# Patient Record
Sex: Male | Born: 1940 | Race: White | Hispanic: No | Marital: Married | Smoking: Former smoker
Health system: Southern US, Community
[De-identification: ages and names within clinical notes are randomized; demographics above are authoritative.]

## PROBLEM LIST (undated history)

## (undated) DIAGNOSIS — F419 Anxiety disorder, unspecified: Secondary | ICD-10-CM

## (undated) DIAGNOSIS — L570 Actinic keratosis: Secondary | ICD-10-CM

## (undated) DIAGNOSIS — H269 Unspecified cataract: Secondary | ICD-10-CM

## (undated) DIAGNOSIS — D378 Neoplasm of uncertain behavior of other specified digestive organs: Secondary | ICD-10-CM

## (undated) DIAGNOSIS — N183 Chronic kidney disease, stage 3 unspecified: Secondary | ICD-10-CM

## (undated) DIAGNOSIS — E785 Hyperlipidemia, unspecified: Secondary | ICD-10-CM

## (undated) DIAGNOSIS — L409 Psoriasis, unspecified: Secondary | ICD-10-CM

## (undated) DIAGNOSIS — D371 Neoplasm of uncertain behavior of stomach: Secondary | ICD-10-CM

## (undated) DIAGNOSIS — C44311 Basal cell carcinoma of skin of nose: Secondary | ICD-10-CM

## (undated) DIAGNOSIS — D375 Neoplasm of uncertain behavior of rectum: Secondary | ICD-10-CM

## (undated) DIAGNOSIS — C61 Malignant neoplasm of prostate: Secondary | ICD-10-CM

## (undated) DIAGNOSIS — I10 Essential (primary) hypertension: Secondary | ICD-10-CM

## (undated) HISTORY — DX: Essential (primary) hypertension: I10

## (undated) HISTORY — DX: Unspecified cataract: H26.9

## (undated) HISTORY — DX: Hyperlipidemia, unspecified: E78.5

## (undated) HISTORY — PX: APPENDECTOMY: SHX54

## (undated) HISTORY — DX: Psoriasis, unspecified: L40.9

## (undated) HISTORY — DX: Chronic kidney disease, stage 3 unspecified: N18.30

## (undated) HISTORY — PX: KNEE ARTHROSCOPY: SHX127

## (undated) HISTORY — DX: Actinic keratosis: L57.0

## (undated) HISTORY — DX: Basal cell carcinoma of skin of nose: C44.311

## (undated) HISTORY — PX: PROSTATECTOMY: SHX69

## (undated) HISTORY — DX: Chronic kidney disease, stage 3 (moderate): N18.3

## (undated) HISTORY — PX: OTHER SURGICAL HISTORY: SHX169

## (undated) HISTORY — PX: COLON SURGERY: SHX602

## (undated) HISTORY — DX: Malignant neoplasm of prostate: C61

## (undated) HISTORY — PX: BOWEL RESECTION: SHX1257

## (undated) HISTORY — PX: KNEE ARTHROPLASTY: SHX992

---

## 2007-08-29 DIAGNOSIS — C61 Malignant neoplasm of prostate: Secondary | ICD-10-CM

## 2007-08-29 HISTORY — DX: Malignant neoplasm of prostate: C61

## 2015-09-14 DIAGNOSIS — I10 Essential (primary) hypertension: Secondary | ICD-10-CM | POA: Diagnosis not present

## 2015-09-14 DIAGNOSIS — E785 Hyperlipidemia, unspecified: Secondary | ICD-10-CM | POA: Diagnosis not present

## 2015-09-14 DIAGNOSIS — R7309 Other abnormal glucose: Secondary | ICD-10-CM | POA: Diagnosis not present

## 2015-12-13 DIAGNOSIS — H52223 Regular astigmatism, bilateral: Secondary | ICD-10-CM | POA: Diagnosis not present

## 2015-12-13 DIAGNOSIS — H524 Presbyopia: Secondary | ICD-10-CM | POA: Diagnosis not present

## 2015-12-13 DIAGNOSIS — H2513 Age-related nuclear cataract, bilateral: Secondary | ICD-10-CM | POA: Diagnosis not present

## 2015-12-13 DIAGNOSIS — H5203 Hypermetropia, bilateral: Secondary | ICD-10-CM | POA: Diagnosis not present

## 2016-01-18 DIAGNOSIS — E042 Nontoxic multinodular goiter: Secondary | ICD-10-CM | POA: Diagnosis not present

## 2016-01-18 DIAGNOSIS — I10 Essential (primary) hypertension: Secondary | ICD-10-CM | POA: Diagnosis not present

## 2016-03-06 DIAGNOSIS — D2371 Other benign neoplasm of skin of right lower limb, including hip: Secondary | ICD-10-CM | POA: Diagnosis not present

## 2016-03-06 DIAGNOSIS — D1801 Hemangioma of skin and subcutaneous tissue: Secondary | ICD-10-CM | POA: Diagnosis not present

## 2016-03-06 DIAGNOSIS — L02222 Furuncle of back [any part, except buttock]: Secondary | ICD-10-CM | POA: Diagnosis not present

## 2016-03-06 DIAGNOSIS — L918 Other hypertrophic disorders of the skin: Secondary | ICD-10-CM | POA: Diagnosis not present

## 2016-03-06 DIAGNOSIS — L820 Inflamed seborrheic keratosis: Secondary | ICD-10-CM | POA: Diagnosis not present

## 2016-03-06 DIAGNOSIS — L821 Other seborrheic keratosis: Secondary | ICD-10-CM | POA: Diagnosis not present

## 2016-03-06 DIAGNOSIS — L538 Other specified erythematous conditions: Secondary | ICD-10-CM | POA: Diagnosis not present

## 2016-03-06 DIAGNOSIS — L720 Epidermal cyst: Secondary | ICD-10-CM | POA: Diagnosis not present

## 2016-03-06 DIAGNOSIS — L814 Other melanin hyperpigmentation: Secondary | ICD-10-CM | POA: Diagnosis not present

## 2016-03-06 DIAGNOSIS — L298 Other pruritus: Secondary | ICD-10-CM | POA: Diagnosis not present

## 2016-03-28 DIAGNOSIS — E785 Hyperlipidemia, unspecified: Secondary | ICD-10-CM | POA: Diagnosis not present

## 2016-03-28 DIAGNOSIS — Z23 Encounter for immunization: Secondary | ICD-10-CM | POA: Diagnosis not present

## 2016-03-28 DIAGNOSIS — Z1212 Encounter for screening for malignant neoplasm of rectum: Secondary | ICD-10-CM | POA: Diagnosis not present

## 2016-03-28 DIAGNOSIS — Z0000 Encounter for general adult medical examination without abnormal findings: Secondary | ICD-10-CM | POA: Diagnosis not present

## 2016-03-28 DIAGNOSIS — I10 Essential (primary) hypertension: Secondary | ICD-10-CM | POA: Diagnosis not present

## 2016-03-28 DIAGNOSIS — R7309 Other abnormal glucose: Secondary | ICD-10-CM | POA: Diagnosis not present

## 2016-03-28 DIAGNOSIS — E041 Nontoxic single thyroid nodule: Secondary | ICD-10-CM | POA: Diagnosis not present

## 2016-03-28 DIAGNOSIS — K635 Polyp of colon: Secondary | ICD-10-CM | POA: Diagnosis not present

## 2016-03-28 DIAGNOSIS — Z125 Encounter for screening for malignant neoplasm of prostate: Secondary | ICD-10-CM | POA: Diagnosis not present

## 2016-03-28 DIAGNOSIS — I6529 Occlusion and stenosis of unspecified carotid artery: Secondary | ICD-10-CM | POA: Diagnosis not present

## 2016-09-01 ENCOUNTER — Ambulatory Visit (INDEPENDENT_AMBULATORY_CARE_PROVIDER_SITE_OTHER): Payer: Medicare Other | Admitting: Primary Care

## 2016-09-01 ENCOUNTER — Encounter: Payer: Self-pay | Admitting: Primary Care

## 2016-09-01 VITALS — BP 144/92 | HR 54 | Temp 98.0°F | Ht 73.0 in | Wt 253.1 lb

## 2016-09-01 DIAGNOSIS — E785 Hyperlipidemia, unspecified: Secondary | ICD-10-CM | POA: Diagnosis not present

## 2016-09-01 DIAGNOSIS — F411 Generalized anxiety disorder: Secondary | ICD-10-CM | POA: Diagnosis not present

## 2016-09-01 DIAGNOSIS — I10 Essential (primary) hypertension: Secondary | ICD-10-CM

## 2016-09-01 MED ORDER — SERTRALINE HCL 50 MG PO TABS: 50.0000 mg | ORAL_TABLET | Freq: Every day | ORAL | 1 refills | Status: DC

## 2016-09-01 MED ORDER — SIMVASTATIN 40 MG PO TABS: 40.0000 mg | ORAL_TABLET | Freq: Every day | ORAL | 1 refills | Status: DC

## 2016-09-01 MED ORDER — FENOFIBRATE MICRONIZED 134 MG PO CAPS: 134.0000 mg | ORAL_CAPSULE | Freq: Every day | ORAL | 1 refills | Status: DC

## 2016-09-01 MED ORDER — AMLODIPINE BESYLATE 2.5 MG PO TABS: 2.5000 mg | ORAL_TABLET | Freq: Every day | ORAL | 1 refills | Status: DC

## 2016-09-01 MED ORDER — LOSARTAN POTASSIUM-HCTZ 100-12.5 MG PO TABS: 1.0000 | ORAL_TABLET | Freq: Every day | ORAL | 1 refills | Status: DC

## 2016-09-01 NOTE — Assessment & Plan Note (Signed)
Diagnosed years ago, managed on Zoloft for about 1 year and has noted much improvement. Refill provided today, denies SI/HI.

## 2016-09-01 NOTE — Assessment & Plan Note (Signed)
Lightly above goal today, suspect due to new environment. Continue Amlodipine and Hyzaar. Will obtain records for BMP.

## 2016-09-01 NOTE — Progress Notes (Signed)
Pre visit review using our clinic review tool, if applicable. No additional management support is needed unless otherwise documented below in the visit note. 

## 2016-09-01 NOTE — Progress Notes (Signed)
   Subjective:    Patient ID: Curtis Black, male    DOB: Sep 15, 1940, 76 y.o.   MRN: [id number]  HPI  Curtis Black is a 76 year old male who presents today to establish care and discuss the problems mentioned below. Will obtain old records. His last physical was in June 2017. He is due for his colonoscopy in 2018.   1) Essential Hypertension: Diagnosed 10 years. Currently managed on Amlodipine 2.5 mg and Losartan-HCTZ 100-12.5 mg. He denies chest pain, dizziness, visual changes in his vision. He is needing refills of his medication.   2) Hyperlipidemia: Currently managed on Simvastatin 40 mg, Fish Oil, and fenofibrate 134 mg. He also takes a daily aspirin. His last lipid panel was in August 2017. He denies myalgias.   3) Generalized Anxiety Disorder: Currently managed on Zoloft 50 mg for which he's taken for the past 1 year. He feels well managed on this medication. He denies SI/HI.  Review of Systems  Eyes: Negative for visual disturbance.  Respiratory: Negative for shortness of breath.   Cardiovascular: Negative for chest pain.  Musculoskeletal: Negative for myalgias.  Neurological: Negative for dizziness and headaches.  Psychiatric/Behavioral: Negative for suicidal ideas.       Past Medical History:  Diagnosis Date  . Essential hypertension   . Hyperlipidemia   . Prostate cancer [facility]) 2009     Social History   Social History  . Marital status: Married    Spouse name: N/A  . Number of children: N/A  . Years of education: N/A   Occupational History  . Not on file.   Social History Main Topics  . Smoking status: Former Research scientist (life sciences)  . Smokeless tobacco: Not on file  . Alcohol use Yes  . Drug use: Unknown  . Sexual activity: Not on file   Other Topics Concern  . Not on file   Social History Narrative   Married.   No children.   Retired. Once worked for [employer].   Enjoys working on his house, playing golf, riding his bike.     No past surgical history on file.  No  family history on file.  No Known Allergies  No current outpatient prescriptions on file prior to visit.   No current facility-administered medications on file prior to visit.     BP (!) 144/92   Pulse (!) 54   Temp 98 F (36.7 C) (Oral)   Ht 6\' 1"  (1.854 m)   Wt 253 lb 1.9 oz (114.8 kg)   SpO2 (!) 54%   BMI 33.40 kg/m    Objective:   Physical Exam  Constitutional: He is oriented to person, place, and time. He appears well-nourished.  Neck: Neck supple.  Cardiovascular: Normal rate and regular rhythm.   Pulmonary/Chest: Effort normal and breath sounds normal. He has no wheezes. He has no rales.  Neurological: He is alert and oriented to person, place, and time.  Skin: Skin is warm and dry.  Psychiatric: He has a normal mood and affect.          Assessment & Plan:

## 2016-09-01 NOTE — Assessment & Plan Note (Signed)
More so hypertriglyceridemia. Continue simvastatin, fish oil, and fenofibrate. Refills provided today. Will obtain records for last labs.

## 2016-09-01 NOTE — Patient Instructions (Signed)
I sent refills of your medications to the requested pharmacies.  Please schedule a physical with me in June 2018. You may also schedule a lab only appointment 3-4 days prior. We will discuss your lab results in detail during your physical.  It was a pleasure to meet you today! Please don't hesitate to call me with any questions. Welcome to [facility]!

## 2016-09-08 ENCOUNTER — Encounter: Payer: Self-pay | Admitting: Primary Care

## 2016-09-08 ENCOUNTER — Telehealth: Payer: Self-pay | Admitting: Primary Care

## 2016-09-08 NOTE — Telephone Encounter (Signed)
Please notify patient that I received and reviewed his records which indicate that he has an aortic abdominal aneurysm. I'd like to get an updated ultrasound to see if there's any change. Please notify me if he's agreeable and I'll order.

## 2016-09-11 NOTE — Telephone Encounter (Signed)
Spoken to patient and he stated that this was a mistake. It ws an error at that office. Both patient and his wife were both Dx with aortic abdominal aneurysm about a week a part. So both went for a follow up and it confirm that it was a mistake.

## 2016-09-11 NOTE — Telephone Encounter (Signed)
Noted  

## 2016-10-31 DIAGNOSIS — H2513 Age-related nuclear cataract, bilateral: Secondary | ICD-10-CM | POA: Diagnosis not present

## 2017-01-17 DIAGNOSIS — D1800 Hemangioma unspecified site: Secondary | ICD-10-CM | POA: Diagnosis not present

## 2017-01-17 DIAGNOSIS — L700 Acne vulgaris: Secondary | ICD-10-CM | POA: Diagnosis not present

## 2017-01-17 DIAGNOSIS — L570 Actinic keratosis: Secondary | ICD-10-CM | POA: Diagnosis not present

## 2017-01-17 DIAGNOSIS — C44311 Basal cell carcinoma of skin of nose: Secondary | ICD-10-CM | POA: Diagnosis not present

## 2017-01-17 DIAGNOSIS — D229 Melanocytic nevi, unspecified: Secondary | ICD-10-CM | POA: Diagnosis not present

## 2017-01-17 DIAGNOSIS — L918 Other hypertrophic disorders of the skin: Secondary | ICD-10-CM | POA: Diagnosis not present

## 2017-01-17 DIAGNOSIS — D485 Neoplasm of uncertain behavior of skin: Secondary | ICD-10-CM | POA: Diagnosis not present

## 2017-01-17 DIAGNOSIS — Z1283 Encounter for screening for malignant neoplasm of skin: Secondary | ICD-10-CM | POA: Diagnosis not present

## 2017-01-17 DIAGNOSIS — L821 Other seborrheic keratosis: Secondary | ICD-10-CM | POA: Diagnosis not present

## 2017-01-17 DIAGNOSIS — Z85828 Personal history of other malignant neoplasm of skin: Secondary | ICD-10-CM | POA: Diagnosis not present

## 2017-01-17 DIAGNOSIS — D692 Other nonthrombocytopenic purpura: Secondary | ICD-10-CM | POA: Diagnosis not present

## 2017-01-17 DIAGNOSIS — L720 Epidermal cyst: Secondary | ICD-10-CM | POA: Diagnosis not present

## 2017-01-17 DIAGNOSIS — L578 Other skin changes due to chronic exposure to nonionizing radiation: Secondary | ICD-10-CM | POA: Diagnosis not present

## 2017-01-30 DIAGNOSIS — L720 Epidermal cyst: Secondary | ICD-10-CM | POA: Diagnosis not present

## 2017-02-15 ENCOUNTER — Encounter: Payer: Medicare Other | Admitting: Primary Care

## 2017-03-06 DIAGNOSIS — C4491 Basal cell carcinoma of skin, unspecified: Secondary | ICD-10-CM

## 2017-03-06 DIAGNOSIS — C44319 Basal cell carcinoma of skin of other parts of face: Secondary | ICD-10-CM | POA: Diagnosis not present

## 2017-03-06 DIAGNOSIS — D2339 Other benign neoplasm of skin of other parts of face: Secondary | ICD-10-CM | POA: Diagnosis not present

## 2017-03-06 DIAGNOSIS — C44119 Basal cell carcinoma of skin of left eyelid, including canthus: Secondary | ICD-10-CM | POA: Diagnosis not present

## 2017-03-06 HISTORY — DX: Basal cell carcinoma of skin, unspecified: C44.91

## 2017-03-11 ENCOUNTER — Other Ambulatory Visit: Payer: Self-pay | Admitting: Primary Care

## 2017-03-11 DIAGNOSIS — I10 Essential (primary) hypertension: Secondary | ICD-10-CM

## 2017-03-12 DIAGNOSIS — C44119 Basal cell carcinoma of skin of left eyelid, including canthus: Secondary | ICD-10-CM | POA: Diagnosis not present

## 2017-03-13 ENCOUNTER — Other Ambulatory Visit: Payer: Self-pay | Admitting: Primary Care

## 2017-03-13 DIAGNOSIS — F411 Generalized anxiety disorder: Secondary | ICD-10-CM

## 2017-03-13 DIAGNOSIS — I10 Essential (primary) hypertension: Secondary | ICD-10-CM

## 2017-03-21 ENCOUNTER — Other Ambulatory Visit: Payer: Self-pay | Admitting: Primary Care

## 2017-03-21 DIAGNOSIS — E785 Hyperlipidemia, unspecified: Secondary | ICD-10-CM

## 2017-03-21 DIAGNOSIS — I10 Essential (primary) hypertension: Secondary | ICD-10-CM

## 2017-03-21 DIAGNOSIS — Z8546 Personal history of malignant neoplasm of prostate: Secondary | ICD-10-CM

## 2017-03-23 NOTE — Progress Notes (Signed)
Subjective:   Curtis Black is a 76 y.o. male who presents for an Initial Medicare Annual Wellness Visit.  Review of Systems  No ROS.  Medicare Wellness Visit. Additional risk factors are reflected in the social history.  Cardiac Risk Factors include: advanced age (>13men, >94 women);male gender;dyslipidemia;hypertension;sedentary lifestyle;obesity (BMI >30kg/m2)    Objective:    Today's Vitals   03/30/17 0803  BP: 140/82  Pulse: (!) 56  Resp: 16  SpO2: 96%  Weight: 244 lb 12.8 oz (111 kg)  Height: 6' 1.2" (1.859 m)   Body mass index is 32.12 kg/m.  Current Medications (verified) Outpatient Encounter Prescriptions as of 03/30/2017  Medication Sig  . amLODipine (NORVASC) 2.5 MG tablet TAKE 1 TABLET (2.5 MG TOTAL) BY MOUTH DAILY.  Marland Kitchen aspirin EC 81 MG tablet Take 81 mg by mouth daily.  . Coenzyme Q10 (COQ10 PO) Take 100 capsules by mouth daily.  . fenofibrate micronized (LOFIBRA) 134 MG capsule Take 1 capsule (134 mg total) by mouth daily before breakfast.  . losartan-hydrochlorothiazide (HYZAAR) 100-12.5 MG tablet TAKE 1 TABLET DAILY  . Multiple Vitamin (MULTIVITAMIN) tablet Take 1 tablet by mouth daily.  . Omega-3 Fatty Acids (FISH OIL) 1000 MG CAPS Take 1,000 mg by mouth 2 (two) times daily.  . sertraline (ZOLOFT) 50 MG tablet TAKE 1 TABLET (50 MG TOTAL) BY MOUTH DAILY.  . simvastatin (ZOCOR) 40 MG tablet Take 1 tablet (40 mg total) by mouth at bedtime.   No facility-administered encounter medications on file as of 03/30/2017.     Allergies (verified) Patient has no known allergies.   History: Past Medical History:  Diagnosis Date  . Abdominal aneurysm ([place])   . Basal cell carcinoma (BCC) of nostril    Removed, no other intervention needed per pt.  . Essential hypertension   . Hyperlipidemia   . Prostate cancer ([place]) 2009   Past Surgical History:  Procedure Laterality Date  . APPENDECTOMY    . COLON SURGERY    . KNEE ARTHROPLASTY Right   . PROSTATECTOMY    .  thyroid biop     Family History  Problem Relation Age of Onset  . Diabetes Sister    Social History   Occupational History  . Not on file.   Social History Main Topics  . Smoking status: Former Research scientist (life sciences)  . Smokeless tobacco: Never Used  . Alcohol use Yes     Comment: Occ.   . Drug use: Unknown  . Sexual activity: Not on file   Tobacco Counseling Counseling given: Not Answered   Activities of Daily Living In your present state of health, do you have any difficulty performing the following activities: 03/30/2017  Hearing? N  Vision? N  Difficulty concentrating or making decisions? N  Walking or climbing stairs? N  Dressing or bathing? N  Doing errands, shopping? N  Preparing Food and eating ? N  Using the Toilet? N  In the past six months, have you accidently leaked urine? N  Do you have problems with loss of bowel control? N  Managing your Medications? N  Managing your Finances? N  Housekeeping or managing your Housekeeping? N  Some recent data might be hidden    Immunizations and Health Maintenance Immunization History  Administered Date(s) Administered  . Pneumococcal Polysaccharide-23 01/05/2010   Health Maintenance Due  Topic Date Due  . TETANUS/TDAP  02/16/1962  . COLONOSCOPY  02/16/1993  . PNA vac Low Risk Adult (2 of 2 - PCV13) 01/06/2011  . INFLUENZA VACCINE  03/28/2017    Patient Care Team: Pleas Koch, NP as PCP - General (Internal Medicine)  Indicate any recent Medical Services you may have received from other than [facility] providers in the past year (date may be approximate).    Assessment:   This is a routine wellness examination for Curtis Black. Physical assessment deferred to PCP.   Hearing/Vision screen  Hearing Screening   125Hz  250Hz  500Hz  1000Hz  2000Hz  3000Hz  4000Hz  6000Hz  8000Hz   Right ear:   40 40 40  0    Left ear:   40 40 0  0    Vision Screening Comments: 02/2017, [facility].  Dietary issues and exercise activities  discussed: Current Exercise Habits: Home exercise routine, Type of exercise: walking (Golf), Time (Minutes): > 60, Frequency (Times/Week): 2, Weekly Exercise (Minutes/Week): 0, Exercise limited by: None identified  Goals    . Exercise 6x per week (30 min per time)          Starting 03/30/2017 I will start working out 6x per week. I also want to loose 40 lbs (current weight 244.8 lbs).       Depression Screen PHQ 2/9 Scores 03/30/2017  PHQ - 2 Score 0    Fall Risk Fall Risk  03/30/2017  Falls in the past year? No    Cognitive Function: PLEASE NOTE: A Mini-Cog screen was completed. Maximum score is 20. A value of 0 denotes this part of Folstein MMSE was not completed or the patient failed this part of the Mini-Cog screening.   Mini-Cog Screening Orientation to Time - Max 5 pts Orientation to Place - Max 5 pts Registration - Max 3 pts Recall - Max 3 pts Language Repeat - Max 1 pts Language Follow 3 Step Command - Max 3 pts      Mini-Cog - 03/30/17 0811    Normal clock drawing test? yes   How many words correct? 3      MMSE - Mini Mental State Exam 03/30/2017  Orientation to time 5  Orientation to Place 5  Registration 3  Attention/ Calculation 0  Recall 3  Language- name 2 objects 0  Language- repeat 1  Language- follow 3 step command 3  Language- read & follow direction 0  Write a sentence 0  Copy design 0  Total score 20        Screening Tests Health Maintenance  Topic Date Due  . TETANUS/TDAP  02/16/1962  . COLONOSCOPY  02/16/1993  . PNA vac Low Risk Adult (2 of 2 - PCV13) 01/06/2011  . INFLUENZA VACCINE  03/28/2017        Plan:   Follow up with PCP as directed.  I have personally reviewed and noted the following in the patient's chart:   . Medical and social history . Use of alcohol, tobacco or illicit drugs  . Current medications and supplements . Functional ability and status . Nutritional status . Physical activity . Advanced directives . List  of other physicians . Vitals . Screenings to include cognitive, depression, and falls . Referrals and appointments  In addition, I have reviewed and discussed with patient certain preventive protocols, quality metrics, and best practice recommendations. A written personalized care plan for preventive services as well as general preventive health recommendations were provided to patient.     Ree Edman, RN   03/30/2017

## 2017-03-23 NOTE — Progress Notes (Signed)
PCP notes:   Health maintenance: Tdap - unsure of last one.  Colonoscopy - 3 years ago per pt. Pt believes he is due. He will check his records before his next appt. He will need a GI referral.   Abnormal screenings: None.   Patient concerns: BP has been trending high. Pt will bring his BP log into his next appt. Pt also states he had an episode of vertigo 4 months ago. Denies allergies. States it only lasted 10 minutes and was gone, no episodes of vertigo since. Suggested he schedule an appt the next time this happens.   Nurse concerns: None.    Next PCP appt: 04/06/2017.

## 2017-03-30 ENCOUNTER — Other Ambulatory Visit (INDEPENDENT_AMBULATORY_CARE_PROVIDER_SITE_OTHER): Payer: Medicare Other

## 2017-03-30 ENCOUNTER — Encounter (INDEPENDENT_AMBULATORY_CARE_PROVIDER_SITE_OTHER): Payer: Self-pay

## 2017-03-30 ENCOUNTER — Ambulatory Visit (INDEPENDENT_AMBULATORY_CARE_PROVIDER_SITE_OTHER): Payer: Medicare Other

## 2017-03-30 VITALS — BP 140/82 | HR 56 | Resp 16 | Ht 73.2 in | Wt 244.8 lb

## 2017-03-30 DIAGNOSIS — E785 Hyperlipidemia, unspecified: Secondary | ICD-10-CM

## 2017-03-30 DIAGNOSIS — Z8546 Personal history of malignant neoplasm of prostate: Secondary | ICD-10-CM | POA: Diagnosis not present

## 2017-03-30 DIAGNOSIS — Z0000 Encounter for general adult medical examination without abnormal findings: Secondary | ICD-10-CM

## 2017-03-30 DIAGNOSIS — I10 Essential (primary) hypertension: Secondary | ICD-10-CM

## 2017-03-30 LAB — COMPREHENSIVE METABOLIC PANEL
ALT: 31 U/L (ref 0–53)
AST: 30 U/L (ref 0–37)
Albumin: 4.7 g/dL (ref 3.5–5.2)
Alkaline Phosphatase: 38 U/L — ABNORMAL LOW (ref 39–117)
BUN: 20 mg/dL (ref 6–23)
CALCIUM: 10 mg/dL (ref 8.4–10.5)
CO2: 30 meq/L (ref 19–32)
Chloride: 104 mEq/L (ref 96–112)
Creatinine, Ser: 1.49 mg/dL (ref 0.40–1.50)
GFR: 48.99 mL/min — AB (ref >60.00)
GLUCOSE: 109 mg/dL — AB (ref 70–99)
POTASSIUM: 4.3 meq/L (ref 3.5–5.1)
Sodium: 141 mEq/L (ref 135–145)
Total Bilirubin: 0.7 mg/dL (ref 0.2–1.2)
Total Protein: 7.2 g/dL (ref 6.0–8.3)

## 2017-03-30 LAB — LIPID PANEL
CHOL/HDL RATIO: 5
Cholesterol: 173 mg/dL (ref 0–200)
HDL: 36.5 mg/dL — AB (ref >39.00)
LDL Cholesterol: 102 mg/dL — ABNORMAL HIGH (ref 0–99)
NONHDL: 136.16
TRIGLYCERIDES: 171 mg/dL — AB (ref 0.0–149.0)
VLDL: 34.2 mg/dL (ref 0.0–40.0)

## 2017-03-30 LAB — PSA: PSA: 0 ng/mL — ABNORMAL LOW (ref 0.10–4.00)

## 2017-03-30 NOTE — Progress Notes (Signed)
I reviewed health advisor's note, was available for consultation, and agree with documentation and plan.  

## 2017-03-30 NOTE — Patient Instructions (Addendum)
[name] ,  Bring a copy of your advance directives to your next office visit.  Thank you for taking time to come for your Medicare Wellness Visit. I appreciate your ongoing commitment to your health goals. Please review the following plan we discussed and let me know if I can assist you in the future.   These are the goals we discussed: Goals    . Exercise 6x per week (30 min per time)          Starting 03/30/2017 I will start working out 6x per week. I also want to loose 40 lbs (current weight 244.8 lbs).        This is a list of the screening recommended for you and due dates:  Health Maintenance  Topic Date Due  . Tetanus Vaccine  02/16/1962  . Colon Cancer Screening  02/16/1993  . Pneumonia vaccines (2 of 2 - PCV13) 01/06/2011  . Flu Shot  03/28/2017   Preventive Care for Adults  A healthy lifestyle and preventive care can promote health and wellness. Preventive health guidelines for adults include the following key practices.  . A routine yearly physical is a good way to check with your health care provider about your health and preventive screening. It is a chance to share any concerns and updates on your health and to receive a thorough exam.  . Visit your dentist for a routine exam and preventive care every 6 months. Brush your teeth twice a day and floss once a day. Good oral hygiene prevents tooth decay and gum disease.  . The frequency of eye exams is based on your age, health, family medical history, use  of contact lenses, and other factors. Follow your health care provider's ecommendations for frequency of eye exams.  . Eat a healthy diet. Foods like vegetables, fruits, whole grains, low-fat dairy products, and lean protein foods contain the nutrients you need without too many calories. Decrease your intake of foods high in solid fats, added sugars, and salt. Eat the right amount of calories for you. Get information about a proper diet from your health care provider,  if necessary.  . Regular physical exercise is one of the most important things you can do for your health. Most adults should get at least 150 minutes of moderate-intensity exercise (any activity that increases your heart rate and causes you to sweat) each week. In addition, most adults need muscle-strengthening exercises on 2 or more days a week.  Silver Sneakers may be a benefit available to you. To determine eligibility, you may visit the website: www.silversneakers.com or contact program at 949-519-9984 Mon-Fri between 8AM-8PM.   . Maintain a healthy weight. The body mass index (BMI) is a screening tool to identify possible weight problems. It provides an estimate of body fat based on height and weight. Your health care provider can find your BMI and can help you achieve or maintain a healthy weight.   For adults 20 years and older: ? A BMI below 18.5 is considered underweight. ? A BMI of 18.5 to 24.9 is normal. ? A BMI of 25 to 29.9 is considered overweight. ? A BMI of 30 and above is considered obese.   . Maintain normal blood lipids and cholesterol levels by exercising and minimizing your intake of saturated fat. Eat a balanced diet with plenty of fruit and vegetables. Blood tests for lipids and cholesterol should begin at age 17 and be repeated every 5 years. If your lipid or cholesterol levels are  high, you are over 50, or you are at high risk for heart disease, you may need your cholesterol levels checked more frequently. Ongoing high lipid and cholesterol levels should be treated with medicines if diet and exercise are not working.  . If you smoke, find out from your health care provider how to quit. If you do not use tobacco, please do not start.  . If you choose to drink alcohol, please do not consume more than 2 drinks per day. One drink is considered to be 12 ounces (355 mL) of beer, 5 ounces (148 mL) of wine, or 1.5 ounces (44 mL) of liquor.  . If you are 90-64 years old, ask  your health care provider if you should take aspirin to prevent strokes.  . Use sunscreen. Apply sunscreen liberally and repeatedly throughout the day. You should seek shade when your shadow is shorter than you. Protect yourself by wearing long sleeves, pants, a wide-brimmed hat, and sunglasses year round, whenever you are outdoors.  . Once a month, do a whole body skin exam, using a mirror to look at the skin on your back. Tell your health care provider of new moles, moles that have irregular borders, moles that are larger than a pencil eraser, or moles that have changed in shape or color.

## 2017-04-06 ENCOUNTER — Ambulatory Visit (INDEPENDENT_AMBULATORY_CARE_PROVIDER_SITE_OTHER): Payer: Medicare Other | Admitting: Primary Care

## 2017-04-06 ENCOUNTER — Encounter: Payer: Self-pay | Admitting: Primary Care

## 2017-04-06 ENCOUNTER — Other Ambulatory Visit: Payer: Self-pay | Admitting: Primary Care

## 2017-04-06 ENCOUNTER — Telehealth: Payer: Self-pay

## 2017-04-06 VITALS — BP 136/84 | HR 61 | Temp 97.5°F | Ht 73.0 in | Wt 247.8 lb

## 2017-04-06 DIAGNOSIS — I10 Essential (primary) hypertension: Secondary | ICD-10-CM

## 2017-04-06 DIAGNOSIS — R7303 Prediabetes: Secondary | ICD-10-CM | POA: Insufficient documentation

## 2017-04-06 DIAGNOSIS — F411 Generalized anxiety disorder: Secondary | ICD-10-CM | POA: Diagnosis not present

## 2017-04-06 DIAGNOSIS — Z23 Encounter for immunization: Secondary | ICD-10-CM

## 2017-04-06 DIAGNOSIS — R739 Hyperglycemia, unspecified: Secondary | ICD-10-CM

## 2017-04-06 DIAGNOSIS — Z1211 Encounter for screening for malignant neoplasm of colon: Secondary | ICD-10-CM

## 2017-04-06 DIAGNOSIS — E785 Hyperlipidemia, unspecified: Secondary | ICD-10-CM

## 2017-04-06 LAB — HEMOGLOBIN A1C: HEMOGLOBIN A1C: 5.9 % (ref 4.6–6.5)

## 2017-04-06 MED ORDER — SERTRALINE HCL 50 MG PO TABS: 50.0000 mg | ORAL_TABLET | Freq: Every day | ORAL | 3 refills | Status: DC

## 2017-04-06 MED ORDER — FENOFIBRATE MICRONIZED 134 MG PO CAPS: 134.0000 mg | ORAL_CAPSULE | Freq: Every day | ORAL | 3 refills | Status: DC

## 2017-04-06 MED ORDER — ATORVASTATIN CALCIUM 20 MG PO TABS: 20.0000 mg | ORAL_TABLET | Freq: Every day | ORAL | 3 refills | Status: DC

## 2017-04-06 MED ORDER — AMLODIPINE BESYLATE 5 MG PO TABS: ORAL_TABLET | ORAL | 3 refills | Status: DC

## 2017-04-06 MED ORDER — ZOSTER VAC RECOMB ADJUVANTED 50 MCG/0.5ML IM SUSR: INTRAMUSCULAR | 1 refills | Status: DC

## 2017-04-06 MED ORDER — SIMVASTATIN 40 MG PO TABS: 40.0000 mg | ORAL_TABLET | Freq: Every day | ORAL | 3 refills | Status: DC

## 2017-04-06 MED ORDER — LOSARTAN POTASSIUM-HCTZ 100-12.5 MG PO TABS: 1.0000 | ORAL_TABLET | Freq: Every day | ORAL | 3 refills | Status: DC

## 2017-04-06 NOTE — Telephone Encounter (Signed)
Message left for patient to return my call.  

## 2017-04-06 NOTE — Progress Notes (Signed)
Subjective:    Patient ID: Curtis Black, male    DOB: [birth year], 76 y.o.   MRN: [id number]  HPI  Curtis Black is a 76 year old male who presents today for [place] Part 2. He's never had the Prevnar 13 vaccination. History of two Pneumovax vaccinations. Did complete Zostavax years ago, would like the Shingrix. Due for colonoscopy.   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 Endorses that this was a mis-diagnosis. Last ultrasound was negative in 2017.  2) Essential Hypertension: Currently managed on amlodipine 2.5 mg, losartan-HCTZ 100-12.5 mg. He believes his BP is trending upward. Checking BP at home and is getting readings of 120-150's/70's-90's. Mostly 140's/90's.   BP Readings from Last 3 Encounters:  04/06/17 136/84  03/30/17 140/82  09/01/16 (!) 144/92     3) GAD: Currently managed on sertraline 50 mg. Feels well managed. Denies SI/HI.   4) Hyperlipidemia: Currently managed on simvastatin 40 mg and fenofibrate 134 mg. Recent lipid panel with trigs slightly above goal. TC and LDL stable.   Diet: He endorses a poor diet. Breakfast: Skips Lunch: Salad, bread, potatoes, pasta, meat Dinner: Cheese, salad Snacks: Cheese, snacks Desserts: Occasionally  Beverages: Coffee, water, wine (daily)  Exercise: Active, not exercising much. Eye exam: Completed in March 2018. [facility]. Colonoscopy: Completed three years ago, due again. Needing referral. PSA: Negative. History of prostatectomy.    Review of Systems  Constitutional: Negative for unexpected weight change.  HENT: Negative for rhinorrhea.   Respiratory: Negative for cough and shortness of breath.   Cardiovascular: Negative for chest pain.  Gastrointestinal: Negative for constipation and diarrhea.  Genitourinary: Negative for difficulty urinating.  Musculoskeletal: Negative for arthralgias and myalgias.  Skin: Negative for rash.  Allergic/Immunologic: Negative for environmental allergies.    Neurological: Negative for dizziness, numbness and headaches.  Psychiatric/Behavioral:       Denies concerns for anxiety or depression. Doing well on Zoloft.       Past Medical History:  Diagnosis Date  . Basal cell carcinoma (BCC) of nostril    Removed, no other intervention needed per pt.  . Essential hypertension   . Hyperlipidemia   . Prostate cancer [facility]) 2009     Social History   Social History  . Marital status: Married    Spouse name: N/A  . Number of children: N/A  . Years of education: N/A   Occupational History  . Not on file.   Social History Main Topics  . Smoking status: Former Research scientist (life sciences)  . Smokeless tobacco: Never Used  . Alcohol use Yes     Comment: Occ.   . Drug use: Unknown  . Sexual activity: Not on file   Other Topics Concern  . Not on file   Social History Narrative   Married.   No children.   Retired. Once worked for [employer].   Enjoys working on his house, playing golf, riding his bike.     Past Surgical History:  Procedure Laterality Date  . APPENDECTOMY    . BOWEL RESECTION    . COLON SURGERY    . KNEE ARTHROPLASTY Right   . PROSTATECTOMY    . thyroid biop      Family History  Problem Relation Age of Onset  . Diabetes Sister     No Known Allergies  Current Outpatient Prescriptions on File Prior to Visit  Medication Sig Dispense Refill  . aspirin EC 81 MG tablet Take 81 mg by mouth daily.    Marland Kitchen  Coenzyme Q10 (COQ10 PO) Take 100 capsules by mouth daily.    . Multiple Vitamin (MULTIVITAMIN) tablet Take 1 tablet by mouth daily.    . Omega-3 Fatty Acids (FISH OIL) 1000 MG CAPS Take 1,000 mg by mouth 2 (two) times daily.     No current facility-administered medications on file prior to visit.     BP 136/84   Pulse 61   Temp (!) 97.5 F (36.4 C) (Oral)   Ht 6\' 1"  (1.854 m)   Wt 247 lb 12.8 oz (112.4 kg)   SpO2 95%   BMI 32.69 kg/m    Objective:   Physical Exam  Constitutional: He is oriented to person, place, and time.  He appears well-nourished.  HENT:  Right Ear: Tympanic membrane and ear canal normal.  Left Ear: Tympanic membrane and ear canal normal.  Nose: Nose normal. Right sinus exhibits no maxillary sinus tenderness and no frontal sinus tenderness. Left sinus exhibits no maxillary sinus tenderness and no frontal sinus tenderness.  Mouth/Throat: Oropharynx is clear and moist.  Eyes: Pupils are equal, round, and reactive to light. Conjunctivae and EOM are normal.  Neck: Neck supple. Carotid bruit is not present. No thyromegaly present.  Cardiovascular: Normal rate, regular rhythm and normal heart sounds.   Pulmonary/Chest: Effort normal and breath sounds normal. He has no wheezes. He has no rales.  Abdominal: Soft. Bowel sounds are normal. There is no tenderness.  Musculoskeletal: Normal range of motion.  Neurological: He is alert and oriented to person, place, and time. He has normal reflexes. No cranial nerve deficit.  Skin: Skin is warm and dry.  Psychiatric: He has a normal mood and affect.          Assessment & Plan:  Health Maintenance:  Provided Prevnar 13 today. Referral placed to GI for colonoscopy. Rx for Shingrix provided. Discussed the importance of a healthy diet and regular exercise in order for weight loss, and to reduce the risk of other medical problems.  Sheral Flow, NP

## 2017-04-06 NOTE — Addendum Note (Signed)
Addended by: Jacqualin Combes on: 04/06/2017 09:34 AM   Modules accepted: Orders

## 2017-04-06 NOTE — Telephone Encounter (Signed)
Spoken to [facility] and notified her of Kate's comments below for patient.

## 2017-04-06 NOTE — Telephone Encounter (Signed)
Spoken and notified patient of Kate's comments. Patient verbalized understanding. 

## 2017-04-06 NOTE — Patient Instructions (Signed)
Complete lab work prior to leaving today. I will notify you of your results once received.   You will be contacted regarding your referral to GI for the colonoscopy.  Please let us know if you have not heard back within one week.   Take the Shingrix vaccination to the pharmacy for administration. This is a two dose series.  Start exercising. You should be getting 150 minutes of moderate intensity exercise weekly.  It's important to improve your diet by reducing consumption of fast food, fried food, processed snack foods, sugary drinks. Increase consumption of fresh vegetables and fruits, whole grains, water.  Ensure you are drinking 64 ounces of water daily.  We've increased your Amlodipine to 5 mg. You may take two of the 2.5 mg tablets until your current bottle is empty. I sent the 5 mg tablets to your pharmacy.  Follow up in 1 year for your annual exam or sooner if needed.  It was a pleasure to see you today!

## 2017-04-06 NOTE — Assessment & Plan Note (Signed)
Home BP numbers above goal. Will increase Amlodipine to 5 mg. He will monitor readings and report readings at or above 140/90.

## 2017-04-06 NOTE — Telephone Encounter (Signed)
Dollie at [place] left v/m about possible drug interaction between amlodipine and simvastatin. Dollie request cb.

## 2017-04-06 NOTE — Telephone Encounter (Signed)
Please notify Dollie that he's been taking Amlodipine for over 1 year, but will switch to atorvastatin 20 mg. Please discontinue simvastatin 40 mg through [facility] Mail order. Start atorvastatin 20 mg tablets. Take 1 tablet by mouth every evening. I sent this through mail order. Continue with increased Amlodipine dose. Please notify patient of all these changes.

## 2017-04-06 NOTE — Assessment & Plan Note (Signed)
Doing well on Zoloft, continue same. Refills sent to pharmacy. 

## 2017-04-06 NOTE — Assessment & Plan Note (Signed)
Slightly above goal with Trigs, however, much improved overall. Continue aspirin, fenofibrate, simvastatin. Continue to monitor lipids. Recommended regular exercise.

## 2017-04-06 NOTE — Assessment & Plan Note (Signed)
Noted on recent labs. Check A1C today.

## 2017-04-17 ENCOUNTER — Telehealth: Payer: Self-pay

## 2017-04-17 ENCOUNTER — Other Ambulatory Visit: Payer: Self-pay

## 2017-04-17 DIAGNOSIS — Z86010 Personal history of colonic polyps: Secondary | ICD-10-CM

## 2017-04-17 NOTE — Telephone Encounter (Signed)
Gastroenterology Pre-Procedure Review  Request Date: 10/9 Requesting Physician: Dr. Vicente Males  *No major illnesses at this time.  PATIENT REVIEW QUESTIONS: The patient responded to the following health history questions as indicated:    1. Are you having any GI issues? no 2. Do you have a personal history of Polyps? yes (removed 2015 & cecum removal) 3. Do you have a family history of Colon Cancer or Polyps? no 4. Diabetes Mellitus? no 5. Joint replacements in the past 12 months?no 6. Major health problems in the past 3 months?no 7. Any artificial heart valves, MVP, or defibrillator?no    MEDICATIONS & ALLERGIES:    Patient reports the following regarding taking any anticoagulation/antiplatelet therapy:   Plavix, Coumadin, Eliquis, Xarelto, Lovenox, Pradaxa, Brilinta, or Effient? no Aspirin? yes (81mg )  Patient confirms/reports the following medications:  Current Outpatient Prescriptions  Medication Sig Dispense Refill  . amLODipine (NORVASC) 5 MG tablet Take 1 tablet by mouth once daily for blood pressure 90 tablet 3  . aspirin EC 81 MG tablet Take 81 mg by mouth daily.    Marland Kitchen atorvastatin (LIPITOR) 20 MG tablet Take 1 tablet (20 mg total) by mouth daily. 90 tablet 3  . Coenzyme Q10 (COQ10 PO) Take 100 capsules by mouth daily.    . fenofibrate micronized (LOFIBRA) 134 MG capsule Take 1 capsule (134 mg total) by mouth daily before breakfast. 90 capsule 3  . losartan-hydrochlorothiazide (HYZAAR) 100-12.5 MG tablet Take 1 tablet by mouth daily. 90 tablet 3  . Multiple Vitamin (MULTIVITAMIN) tablet Take 1 tablet by mouth daily.    . Omega-3 Fatty Acids (FISH OIL) 1000 MG CAPS Take 1,000 mg by mouth 2 (two) times daily.    . sertraline (ZOLOFT) 50 MG tablet Take 1 tablet (50 mg total) by mouth daily. 90 tablet 3  . Zoster Vac Recomb Adjuvanted [facility]) injection Inject into the muscle once. Repeat 2-6 months after the first vaccination. 0.5 mL 1   No current facility-administered  medications for this visit.     Patient confirms/reports the following allergies:  No Known Allergies  No orders of the defined types were placed in this encounter.   AUTHORIZATION INFORMATION Primary Insurance: 1D#: Group #:  Secondary Insurance: 1D#: Group #:  SCHEDULE INFORMATION: Date: 10/9 Time: Location: [place]

## 2017-06-05 ENCOUNTER — Encounter
Admission: RE | Disposition: A | Discharge status: Home / Self Care | Payer: Self-pay | Source: Ambulatory Visit | Attending: Gastroenterology

## 2017-06-05 ENCOUNTER — Ambulatory Visit: Payer: Medicare Other | Admitting: Anesthesiology

## 2017-06-05 ENCOUNTER — Encounter: Payer: Self-pay | Admitting: *Deleted

## 2017-06-05 ENCOUNTER — Ambulatory Visit
Admission: RE | Admit: 2017-06-05 | Discharge: 2017-06-05 | Disposition: A | Discharge status: Home / Self Care | Payer: Medicare Other | Source: Ambulatory Visit | Attending: Gastroenterology | Admitting: Gastroenterology

## 2017-06-05 DIAGNOSIS — Z85828 Personal history of other malignant neoplasm of skin: Secondary | ICD-10-CM | POA: Insufficient documentation

## 2017-06-05 DIAGNOSIS — D124 Benign neoplasm of descending colon: Secondary | ICD-10-CM | POA: Diagnosis not present

## 2017-06-05 DIAGNOSIS — Z86010 Personal history of colonic polyps: Secondary | ICD-10-CM | POA: Diagnosis not present

## 2017-06-05 DIAGNOSIS — D125 Benign neoplasm of sigmoid colon: Secondary | ICD-10-CM | POA: Insufficient documentation

## 2017-06-05 DIAGNOSIS — Z8546 Personal history of malignant neoplasm of prostate: Secondary | ICD-10-CM | POA: Diagnosis not present

## 2017-06-05 DIAGNOSIS — Z87891 Personal history of nicotine dependence: Secondary | ICD-10-CM | POA: Diagnosis not present

## 2017-06-05 DIAGNOSIS — Z7982 Long term (current) use of aspirin: Secondary | ICD-10-CM | POA: Insufficient documentation

## 2017-06-05 DIAGNOSIS — D122 Benign neoplasm of ascending colon: Secondary | ICD-10-CM | POA: Diagnosis not present

## 2017-06-05 DIAGNOSIS — K635 Polyp of colon: Secondary | ICD-10-CM | POA: Diagnosis not present

## 2017-06-05 DIAGNOSIS — F419 Anxiety disorder, unspecified: Secondary | ICD-10-CM | POA: Insufficient documentation

## 2017-06-05 DIAGNOSIS — D123 Benign neoplasm of transverse colon: Secondary | ICD-10-CM

## 2017-06-05 DIAGNOSIS — K5730 Diverticulosis of large intestine without perforation or abscess without bleeding: Secondary | ICD-10-CM | POA: Diagnosis not present

## 2017-06-05 DIAGNOSIS — E785 Hyperlipidemia, unspecified: Secondary | ICD-10-CM | POA: Insufficient documentation

## 2017-06-05 DIAGNOSIS — Z1211 Encounter for screening for malignant neoplasm of colon: Secondary | ICD-10-CM | POA: Diagnosis not present

## 2017-06-05 DIAGNOSIS — I10 Essential (primary) hypertension: Secondary | ICD-10-CM | POA: Insufficient documentation

## 2017-06-05 DIAGNOSIS — Z79899 Other long term (current) drug therapy: Secondary | ICD-10-CM | POA: Diagnosis not present

## 2017-06-05 DIAGNOSIS — K5790 Diverticulosis of intestine, part unspecified, without perforation or abscess without bleeding: Secondary | ICD-10-CM | POA: Diagnosis not present

## 2017-06-05 HISTORY — DX: Anxiety disorder, unspecified: F41.9

## 2017-06-05 HISTORY — PX: COLONOSCOPY WITH PROPOFOL: SHX5780

## 2017-06-05 SURGERY — COLONOSCOPY WITH PROPOFOL
Anesthesia: General

## 2017-06-05 MED ORDER — PROPOFOL 500 MG/50ML IV EMUL
INTRAVENOUS | Status: AC
  Filled 2017-06-05: qty 50

## 2017-06-05 MED ORDER — PROPOFOL 500 MG/50ML IV EMUL
INTRAVENOUS | Status: DC | PRN
  Administered 2017-06-05: 125 ug/kg/min via INTRAVENOUS

## 2017-06-05 MED ORDER — LIDOCAINE HCL (PF) 2 % IJ SOLN
INTRAMUSCULAR | Status: AC
  Filled 2017-06-05: qty 10

## 2017-06-05 MED ORDER — LIDOCAINE HCL (CARDIAC) 20 MG/ML IV SOLN
INTRAVENOUS | Status: DC | PRN
  Administered 2017-06-05: 50 mg via INTRAVENOUS

## 2017-06-05 MED ORDER — PROPOFOL 10 MG/ML IV BOLUS
INTRAVENOUS | Status: DC | PRN
  Administered 2017-06-05: 50 mg via INTRAVENOUS
  Administered 2017-06-05: 20 mg via INTRAVENOUS

## 2017-06-05 MED ORDER — SODIUM CHLORIDE 0.9 % IV SOLN
INTRAVENOUS | Status: DC
  Administered 2017-06-05: 1000 mL via INTRAVENOUS

## 2017-06-05 NOTE — Anesthesia Post-op Follow-up Note (Signed)
Anesthesia QCDR form completed.        

## 2017-06-05 NOTE — H&P (Signed)
Curtis Bellows MD [address], [place], [place] [address] Phone: [phone] Fax : [phone]  Primary Care Physician:  Pleas Koch, NP Primary Gastroenterologist:  Dr. Jonathon Black   Pre-Procedure History & Physical: HPI:  Curtis Hornig. is a 76 y.o. male is here for an colonoscopy.   Past Medical History:  Diagnosis Date  . Anxiety   . Basal cell carcinoma (BCC) of nostril    Removed, no other intervention needed per pt.  . Essential hypertension   . Hyperlipidemia   . Prostate cancer ([place]) 2009    Past Surgical History:  Procedure Laterality Date  . APPENDECTOMY    . BOWEL RESECTION    . COLON SURGERY    . KNEE ARTHROPLASTY Right   . PROSTATECTOMY    . thyroid biop      Prior to Admission medications   Medication Sig Start Date End Date Taking? Authorizing Provider  amLODipine (NORVASC) 5 MG tablet Take 1 tablet by mouth once daily for blood pressure 04/06/17   Pleas Koch, NP  aspirin EC 81 MG tablet Take 81 mg by mouth daily.    [provider]  atorvastatin (LIPITOR) 20 MG tablet Take 1 tablet (20 mg total) by mouth daily. 04/06/17   Pleas Koch, NP  Coenzyme Q10 (COQ10 PO) Take 100 capsules by mouth daily.    [provider]  fenofibrate micronized (LOFIBRA) 134 MG capsule Take 1 capsule (134 mg total) by mouth daily before breakfast. 04/06/17   Pleas Koch, NP  losartan-hydrochlorothiazide (HYZAAR) 100-12.5 MG tablet Take 1 tablet by mouth daily. 04/06/17   Pleas Koch, NP  Multiple Vitamin (MULTIVITAMIN) tablet Take 1 tablet by mouth daily.    [provider]  Omega-3 Fatty Acids (FISH OIL) 1000 MG CAPS Take 1,000 mg by mouth 2 (two) times daily.    [provider]  sertraline (ZOLOFT) 50 MG tablet Take 1 tablet (50 mg total) by mouth daily. 04/06/17   Pleas Koch, NP  Zoster Vac Recomb Adjuvanted [facility]) injection Inject into the muscle once. Repeat 2-6 months after the first  vaccination. 04/06/17   Pleas Koch, NP    Allergies as of 04/17/2017  . (No Known Allergies)    Family History  Problem Relation Age of Onset  . Diabetes Sister     Social History   Social History  . Marital status: Married    Spouse name: N/A  . Number of children: N/A  . Years of education: N/A   Occupational History  . Not on file.   Social History Main Topics  . Smoking status: Former Research scientist (life sciences)  . Smokeless tobacco: Never Used  . Alcohol use Yes     Comment: Occ.   . Drug use: Unknown  . Sexual activity: Not on file   Other Topics Concern  . Not on file   Social History Narrative   Married.   No children.   Retired. Once worked for [employer].   Enjoys working on his house, playing golf, riding his bike.     Review of Systems: See HPI, otherwise negative ROS  Physical Exam: There were no vitals taken for this visit. General:   Alert,  pleasant and cooperative in NAD Head:  Normocephalic and atraumatic. Neck:  Supple; no masses or thyromegaly. Lungs:  Clear throughout to auscultation.    Heart:  Regular rate and rhythm. Abdomen:  Soft, nontender and nondistended. Normal bowel sounds, without guarding, and without rebound.  Neurologic:  Alert and  oriented x4;  grossly normal neurologically.  Impression/Plan: Curtis Rankin. is here for an colonoscopy to be performed for surveillance due to prior history of colon polyps.   Risks, benefits, limitations, and alternatives regarding  colonoscopy have been reviewed with the patient.  Questions have been answered.  All parties agreeable.   Curtis Bellows, MD  06/05/2017, 9:06 AM

## 2017-06-05 NOTE — Op Note (Signed)
[facility] Gastroenterology Patient Name: Curtis Black Procedure Date: 06/05/2017 10:01 AM MRN: [id number] Account #: [id number] Date of Birth: [birth year] Admit Type: Outpatient Age: 76 Room: [facility] ENDO ROOM 1 Gender: Male Note Status: Finalized Procedure:            Colonoscopy Indications:          High risk colon cancer surveillance: Personal history                        of colonic polyps Providers:            Jonathon Bellows MD, MD Referring MD:         Pleas Koch (Referring MD) Medicines:            Monitored Anesthesia Care Complications:        No immediate complications. Procedure:            Pre-Anesthesia Assessment:                       - Prior to the procedure, a History and Physical was                        performed, and patient medications, allergies and                        sensitivities were reviewed. The patient's tolerance of                        previous anesthesia was reviewed.                       - The risks and benefits of the procedure and the                        sedation options and risks were discussed with the                        patient. All questions were answered and informed                        consent was obtained.                       - ASA Grade Assessment: III - A patient with severe                        systemic disease.                       After obtaining informed consent, the colonoscope was                        passed under direct vision. Throughout the procedure,                        the patient's blood pressure, pulse, and oxygen                        saturations were monitored continuously. The                        Colonoscope  was introduced through the anus and                        advanced to the the terminal ileum. The colonoscopy was                        performed with ease. The patient tolerated the                        procedure well. The quality of the bowel preparation                      was good. Findings:      The perianal and digital rectal examinations were normal.      Two sessile polyps were found in the descending colon and ascending       colon. The polyps were 6 to 8 mm in size. These polyps were removed with       a cold snare. Resection and retrieval were complete.      Six sessile polyps were found in the sigmoid colon. The polyps were 6 to       8 mm in size. These polyps were removed with a cold snare. Resection and       retrieval were complete.      Four sessile polyps were found in the transverse colon. The polyps were       6 to 9 mm in size. These polyps were removed with a cold snare.       Resection and retrieval were complete.      Multiple small-mouthed diverticula were found in the entire colon.      The exam was otherwise without abnormality on direct and retroflexion       views. Impression:           - Two 6 to 8 mm polyps in the descending colon and in                        the ascending colon, removed with a cold snare.                        Resected and retrieved.                       - Six 6 to 8 mm polyps in the sigmoid colon, removed                        with a cold snare. Resected and retrieved.                       - Four 6 to 9 mm polyps in the transverse colon,                        removed with a cold snare. Resected and retrieved.                       - Diverticulosis in the entire examined colon.                       - The examination was otherwise normal on direct and  retroflexion views. Recommendation:       - Discharge patient to home (with escort).                       - Resume previous diet.                       - Continue present medications.                       - Await pathology results.                       - Repeat colonoscopy in 3 years for surveillance. Procedure Code(s):    --- Professional ---                       [phone], Colonoscopy, flexible; with removal of  tumor(s),                        polyp(s), or other lesion(s) by snare technique Diagnosis Code(s):    --- Professional ---                       Z86.010, Personal history of colonic polyps                       D12.4, Benign neoplasm of descending colon                       D12.2, Benign neoplasm of ascending colon                       D12.5, Benign neoplasm of sigmoid colon                       K57.30, Diverticulosis of large intestine without                        perforation or abscess without bleeding                       D12.3, Benign neoplasm of transverse colon (hepatic                        flexure or splenic flexure) CPT copyright 2016 American Medical Association. All rights reserved. The codes documented in this report are preliminary and upon coder review may  be revised to meet current compliance requirements. Jonathon Bellows, MD Jonathon Bellows MD, MD 06/05/2017 10:42:40 AM This report has been signed electronically. Number of Addenda: 0 Note Initiated On: 06/05/2017 10:01 AM Scope Withdrawal Time: 0 hours 24 minutes 11 seconds  Total Procedure Duration: 0 hours 34 minutes 51 seconds       [facility]

## 2017-06-05 NOTE — Anesthesia Procedure Notes (Addendum)
Performed by: Lance Muss Pre-anesthesia Checklist: Patient identified, Emergency Drugs available, Suction available, Patient being monitored and Timeout performed Patient Re-evaluated:Patient Re-evaluated prior to induction Oxygen Delivery Method: Nasal cannula Induction Type: IV induction Ventilation: Nasal airway inserted- appropriate to patient size

## 2017-06-05 NOTE — Anesthesia Preprocedure Evaluation (Signed)
Anesthesia Evaluation  Patient identified by MRN, date of birth, ID band Patient awake    Reviewed: Allergy & Precautions, H&P , NPO status , Patient's Chart, lab work & pertinent test results, reviewed documented beta blocker date and time   Airway Mallampati: II   Neck ROM: full    Dental  (+) Teeth Intact   Pulmonary neg pulmonary ROS, former smoker,    Pulmonary exam normal        Cardiovascular Exercise Tolerance: Good hypertension, On Medications negative cardio ROS Normal cardiovascular exam Rhythm:regular Rate:Normal     Neuro/Psych PSYCHIATRIC DISORDERS negative neurological ROS  negative psych ROS   GI/Hepatic negative GI ROS, Neg liver ROS,   Endo/Other  negative endocrine ROS  Renal/GU negative Renal ROS  negative genitourinary   Musculoskeletal   Abdominal   Peds  Hematology negative hematology ROS (+)   Anesthesia Other Findings Past Medical History: No date: Anxiety No date: Basal cell carcinoma (BCC) of nostril     Comment:  Removed, no other intervention needed per pt. No date: Essential hypertension No date: Hyperlipidemia 2009: Prostate cancer [facility]) Past Surgical History: No date: APPENDECTOMY No date: BOWEL RESECTION No date: COLON SURGERY No date: KNEE ARTHROPLASTY; Right No date: PROSTATECTOMY No date: thyroid biop BMI    Body Mass Index:  30.81 kg/m     Reproductive/Obstetrics negative OB ROS                             Anesthesia Physical Anesthesia Plan  ASA: II  Anesthesia Plan: General   Post-op Pain Management:    Induction:   PONV Risk Score and Plan:   Airway Management Planned:   Additional Equipment:   Intra-op Plan:   Post-operative Plan:   Informed Consent: I have reviewed the patients History and Physical, chart, labs and discussed the procedure including the risks, benefits and alternatives for the proposed anesthesia with  the patient or authorized representative who has indicated his/her understanding and acceptance.   Dental Advisory Given  Plan Discussed with: CRNA  Anesthesia Plan Comments:         Anesthesia Quick Evaluation

## 2017-06-05 NOTE — Transfer of Care (Signed)
Immediate Anesthesia Transfer of Care Note  Patient: Curtis Black.  Procedure(s) Performed: COLONOSCOPY WITH PROPOFOL (N/A )  Patient Location: PACU  Anesthesia Type:General  Level of Consciousness: sedated  Airway & Oxygen Therapy: Patient Spontanous Breathing and Patient connected to nasal cannula oxygen  Post-op Assessment: Report given to RN and Post -op Vital signs reviewed and stable  Post vital signs: Reviewed and stable  Last Vitals:  Vitals:   06/05/17 1044 06/05/17 1046  BP: (!) 92/46 (!) 92/46  Pulse: (!) 46 (!) 41  Resp: 13 13  Temp: 36.5 C   SpO2: 95% 98%    Last Pain:  Vitals:   06/05/17 1044  TempSrc: Tympanic         Complications: No apparent anesthesia complications

## 2017-06-06 ENCOUNTER — Encounter: Payer: Self-pay | Admitting: Gastroenterology

## 2017-06-06 NOTE — Anesthesia Postprocedure Evaluation (Signed)
Anesthesia Post Note  Patient: Curtis Black.  Procedure(s) Performed: COLONOSCOPY WITH PROPOFOL (N/A )  Patient location during evaluation: PACU Anesthesia Type: General Level of consciousness: awake and alert Pain management: pain level controlled Vital Signs Assessment: post-procedure vital signs reviewed and stable Respiratory status: spontaneous breathing, nonlabored ventilation, respiratory function stable and patient connected to nasal cannula oxygen Cardiovascular status: blood pressure returned to baseline and stable Postop Assessment: no apparent nausea or vomiting Anesthetic complications: no     Last Vitals:  Vitals:   06/05/17 1114 06/05/17 1124  BP: (!) 151/76 (!) 141/78  Pulse: (!) 40 (!) 44  Resp: 15 17  Temp:    SpO2: 97% 100%    Last Pain:  Vitals:   06/05/17 1044  TempSrc: Tympanic                 Molli Barrows

## 2017-06-07 LAB — SURGICAL PATHOLOGY

## 2017-06-08 ENCOUNTER — Other Ambulatory Visit: Payer: Self-pay

## 2017-06-12 DIAGNOSIS — L820 Inflamed seborrheic keratosis: Secondary | ICD-10-CM | POA: Diagnosis not present

## 2017-06-12 DIAGNOSIS — L578 Other skin changes due to chronic exposure to nonionizing radiation: Secondary | ICD-10-CM | POA: Diagnosis not present

## 2017-06-12 DIAGNOSIS — Z85828 Personal history of other malignant neoplasm of skin: Secondary | ICD-10-CM | POA: Diagnosis not present

## 2017-06-12 DIAGNOSIS — L821 Other seborrheic keratosis: Secondary | ICD-10-CM | POA: Diagnosis not present

## 2017-06-13 DIAGNOSIS — Z23 Encounter for immunization: Secondary | ICD-10-CM | POA: Diagnosis not present

## 2017-06-14 ENCOUNTER — Encounter: Payer: Self-pay | Admitting: Gastroenterology

## 2017-11-22 DIAGNOSIS — H2513 Age-related nuclear cataract, bilateral: Secondary | ICD-10-CM | POA: Diagnosis not present

## 2018-01-08 ENCOUNTER — Other Ambulatory Visit: Payer: Self-pay | Admitting: Podiatry

## 2018-01-08 ENCOUNTER — Ambulatory Visit (INDEPENDENT_AMBULATORY_CARE_PROVIDER_SITE_OTHER): Payer: Medicare Other

## 2018-01-08 ENCOUNTER — Encounter: Payer: Self-pay | Admitting: Podiatry

## 2018-01-08 ENCOUNTER — Ambulatory Visit (INDEPENDENT_AMBULATORY_CARE_PROVIDER_SITE_OTHER): Payer: Medicare Other | Admitting: Podiatry

## 2018-01-08 DIAGNOSIS — M779 Enthesopathy, unspecified: Secondary | ICD-10-CM

## 2018-01-08 DIAGNOSIS — M7672 Peroneal tendinitis, left leg: Secondary | ICD-10-CM

## 2018-01-08 DIAGNOSIS — M7751 Other enthesopathy of right foot: Secondary | ICD-10-CM

## 2018-01-08 DIAGNOSIS — M7671 Peroneal tendinitis, right leg: Secondary | ICD-10-CM | POA: Diagnosis not present

## 2018-01-08 DIAGNOSIS — M79672 Pain in left foot: Secondary | ICD-10-CM

## 2018-01-08 DIAGNOSIS — M79671 Pain in right foot: Secondary | ICD-10-CM

## 2018-01-08 MED ORDER — MELOXICAM 15 MG PO TABS: 15.0000 mg | ORAL_TABLET | Freq: Every day | ORAL | 1 refills | Status: AC

## 2018-01-10 DIAGNOSIS — D1801 Hemangioma of skin and subcutaneous tissue: Secondary | ICD-10-CM | POA: Diagnosis not present

## 2018-01-10 DIAGNOSIS — L821 Other seborrheic keratosis: Secondary | ICD-10-CM | POA: Diagnosis not present

## 2018-01-10 DIAGNOSIS — L739 Follicular disorder, unspecified: Secondary | ICD-10-CM | POA: Diagnosis not present

## 2018-01-10 DIAGNOSIS — L720 Epidermal cyst: Secondary | ICD-10-CM | POA: Diagnosis not present

## 2018-01-10 DIAGNOSIS — Z85828 Personal history of other malignant neoplasm of skin: Secondary | ICD-10-CM | POA: Diagnosis not present

## 2018-01-10 DIAGNOSIS — D225 Melanocytic nevi of trunk: Secondary | ICD-10-CM | POA: Diagnosis not present

## 2018-01-10 DIAGNOSIS — L820 Inflamed seborrheic keratosis: Secondary | ICD-10-CM | POA: Diagnosis not present

## 2018-01-10 DIAGNOSIS — L578 Other skin changes due to chronic exposure to nonionizing radiation: Secondary | ICD-10-CM | POA: Diagnosis not present

## 2018-01-10 DIAGNOSIS — L570 Actinic keratosis: Secondary | ICD-10-CM | POA: Diagnosis not present

## 2018-01-10 DIAGNOSIS — Z1283 Encounter for screening for malignant neoplasm of skin: Secondary | ICD-10-CM | POA: Diagnosis not present

## 2018-01-10 DIAGNOSIS — D2230 Melanocytic nevi of unspecified part of face: Secondary | ICD-10-CM | POA: Diagnosis not present

## 2018-01-10 NOTE — Progress Notes (Signed)
   HPI: 77 year old male presenting today as a new patient with a chief complaint of an intermittent aching pain to the 5th metatarsal of the bilateral feet that began 3 weeks ago. Walking and playing golf increases his pain. He has been icing the area for treatment with some relief. Patient is here for further evaluation and treatment.   Past Medical History:  Diagnosis Date  . Anxiety   . Basal cell carcinoma (BCC) of nostril    Removed, no other intervention needed per pt.  . Essential hypertension   . Hyperlipidemia   . Prostate cancer [facility]) 2009     Physical Exam: General: The patient is alert and oriented x3 in no acute distress.  Dermatology: Skin is warm, dry and supple bilateral lower extremities. Negative for open lesions or macerations.  Vascular: Palpable pedal pulses bilaterally. No edema or erythema noted. Capillary refill within normal limits.  Neurological: Epicritic and protective threshold grossly intact bilaterally.   Musculoskeletal Exam: Pain with palpation to the insertion of the peroneal tendon of the bilateral feet. Range of motion within normal limits to all pedal and ankle joints bilateral. Muscle strength 5/5 in all groups bilateral.   Radiographic Exam:  Normal osseous mineralization. Joint spaces preserved. No fracture/dislocation/boney destruction.    Assessment: 1. Insertional peroneal tendinitis bilateral    Plan of Care:  1. Patient evaluated. X-Rays reviewed.  2. Injection of 0.5 mLs Celestone Soluspan injected into the peroneal tendon of the bilateral feet.  3. Prescription for Meloxicam provided to patient.  4. Recommended wide fitting shoe gear.  5. Return to clinic as needed.   Retired Armed forces logistics/support/administrative officer. Avid golfer.      Edrick Kins, DPM [facility]  Dr. Edrick Kins, DPM    [address], [place] [address]                Office [phone]  Fax [phone]

## 2018-01-31 ENCOUNTER — Other Ambulatory Visit: Payer: Self-pay | Admitting: Primary Care

## 2018-01-31 DIAGNOSIS — E785 Hyperlipidemia, unspecified: Secondary | ICD-10-CM

## 2018-02-10 ENCOUNTER — Other Ambulatory Visit: Payer: Self-pay | Admitting: Primary Care

## 2018-02-10 DIAGNOSIS — E785 Hyperlipidemia, unspecified: Secondary | ICD-10-CM

## 2018-03-06 ENCOUNTER — Other Ambulatory Visit: Payer: Self-pay | Admitting: Primary Care

## 2018-03-06 DIAGNOSIS — I10 Essential (primary) hypertension: Secondary | ICD-10-CM

## 2018-04-01 ENCOUNTER — Other Ambulatory Visit: Payer: Self-pay | Admitting: Primary Care

## 2018-04-01 DIAGNOSIS — I10 Essential (primary) hypertension: Secondary | ICD-10-CM

## 2018-04-16 ENCOUNTER — Ambulatory Visit: Payer: Medicare Other

## 2018-04-19 ENCOUNTER — Ambulatory Visit (INDEPENDENT_AMBULATORY_CARE_PROVIDER_SITE_OTHER): Payer: Medicare Other

## 2018-04-19 VITALS — BP 126/84 | HR 45 | Temp 98.2°F | Ht 74.0 in | Wt 242.5 lb

## 2018-04-19 DIAGNOSIS — R739 Hyperglycemia, unspecified: Secondary | ICD-10-CM | POA: Diagnosis not present

## 2018-04-19 DIAGNOSIS — I10 Essential (primary) hypertension: Secondary | ICD-10-CM

## 2018-04-19 DIAGNOSIS — Z0000 Encounter for general adult medical examination without abnormal findings: Secondary | ICD-10-CM

## 2018-04-19 DIAGNOSIS — Z8546 Personal history of malignant neoplasm of prostate: Secondary | ICD-10-CM | POA: Diagnosis not present

## 2018-04-19 DIAGNOSIS — E785 Hyperlipidemia, unspecified: Secondary | ICD-10-CM | POA: Diagnosis not present

## 2018-04-19 LAB — CBC WITH DIFFERENTIAL/PLATELET
BASOS PCT: 0.7 % (ref 0.0–3.0)
Basophils Absolute: 0 10*3/uL (ref 0.0–0.1)
EOS PCT: 1.9 % (ref 0.0–5.0)
Eosinophils Absolute: 0.1 10*3/uL (ref 0.0–0.7)
HCT: 45.5 % (ref 39.0–52.0)
Hemoglobin: 15.7 g/dL (ref 13.0–17.0)
Lymphocytes Relative: 30.4 % (ref 12.0–46.0)
Lymphs Abs: 1.9 10*3/uL (ref 0.7–4.0)
MCHC: 34.6 g/dL (ref 30.0–36.0)
MCV: 90.2 fl (ref 78.0–100.0)
MONOS PCT: 10 % (ref 3.0–12.0)
Monocytes Absolute: 0.6 10*3/uL (ref 0.1–1.0)
NEUTROS ABS: 3.6 10*3/uL (ref 1.4–7.7)
NEUTROS PCT: 57 % (ref 43.0–77.0)
PLATELETS: 247 10*3/uL (ref 150.0–400.0)
RBC: 5.05 Mil/uL (ref 4.22–5.81)
RDW: 13.4 % (ref 11.5–15.5)
WBC: 6.4 10*3/uL (ref 4.0–10.5)

## 2018-04-19 LAB — COMPREHENSIVE METABOLIC PANEL
ALT: 23 U/L (ref 0–53)
AST: 25 U/L (ref 0–37)
Albumin: 4.6 g/dL (ref 3.5–5.2)
Alkaline Phosphatase: 38 U/L — ABNORMAL LOW (ref 39–117)
BUN: 26 mg/dL — ABNORMAL HIGH (ref 6–23)
CHLORIDE: 102 meq/L (ref 96–112)
CO2: 30 meq/L (ref 19–32)
Calcium: 10.4 mg/dL (ref 8.4–10.5)
Creatinine, Ser: 1.51 mg/dL — ABNORMAL HIGH (ref 0.40–1.50)
GFR: 48.1 mL/min — AB (ref >60.00)
Glucose, Bld: 114 mg/dL — ABNORMAL HIGH (ref 70–99)
POTASSIUM: 4 meq/L (ref 3.5–5.1)
Sodium: 139 mEq/L (ref 135–145)
Total Bilirubin: 0.8 mg/dL (ref 0.2–1.2)
Total Protein: 7.1 g/dL (ref 6.0–8.3)

## 2018-04-19 LAB — LIPID PANEL
CHOL/HDL RATIO: 5
Cholesterol: 167 mg/dL (ref 0–200)
HDL: 36.1 mg/dL — AB (ref >39.00)
LDL Cholesterol: 92 mg/dL (ref 0–99)
NONHDL: 131.06
Triglycerides: 195 mg/dL — ABNORMAL HIGH (ref 0.0–149.0)
VLDL: 39 mg/dL (ref 0.0–40.0)

## 2018-04-19 LAB — HEMOGLOBIN A1C: Hgb A1c MFr Bld: 6 % (ref 4.6–6.5)

## 2018-04-19 LAB — PSA: PSA: 0 ng/mL — ABNORMAL LOW (ref 0.10–4.00)

## 2018-04-19 NOTE — Patient Instructions (Signed)
Mr. Lemmerman , Thank you for taking time to come for your Medicare Wellness Visit. I appreciate your ongoing commitment to your health goals. Please review the following plan we discussed and let me know if I can assist you in the future.   These are the goals we discussed: Goals    . Exercise 6x per week (30 min per time)     Starting 04/19/2018, I will continue working out 6x per week for 1.5-5 hours per day.         This is a list of the screening recommended for you and due dates:  Health Maintenance  Topic Date Due  . Flu Shot  11/27/2018*  . Colon Cancer Screening  06/05/2020  . Tetanus Vaccine  11/14/2027  . Pneumonia vaccines  Completed  *Topic was postponed. The date shown is not the original due date.   Preventive Care for Adults  A healthy lifestyle and preventive care can promote health and wellness. Preventive health guidelines for adults include the following key practices.  . A routine yearly physical is a good way to check with your health care provider about your health and preventive screening. It is a chance to share any concerns and updates on your health and to receive a thorough exam.  . Visit your dentist for a routine exam and preventive care every 6 months. Brush your teeth twice a day and floss once a day. Good oral hygiene prevents tooth decay and gum disease.  . The frequency of eye exams is based on your age, health, family medical history, use  of contact lenses, and other factors. Follow your health care provider's recommendations for frequency of eye exams.  . Eat a healthy diet. Foods like vegetables, fruits, whole grains, low-fat dairy products, and lean protein foods contain the nutrients you need without too many calories. Decrease your intake of foods high in solid fats, added sugars, and salt. Eat the right amount of calories for you. Get information about a proper diet from your health care provider, if necessary.  . Regular physical exercise is  one of the most important things you can do for your health. Most adults should get at least 150 minutes of moderate-intensity exercise (any activity that increases your heart rate and causes you to sweat) each week. In addition, most adults need muscle-strengthening exercises on 2 or more days a week.  Silver Sneakers may be a benefit available to you. To determine eligibility, you may visit the website: www.silversneakers.com or contact program at 210-020-0250 Mon-Fri between 8AM-8PM.   . Maintain a healthy weight. The body mass index (BMI) is a screening tool to identify possible weight problems. It provides an estimate of body fat based on height and weight. Your health care provider can find your BMI and can help you achieve or maintain a healthy weight.   For adults 20 years and older: ? A BMI below 18.5 is considered underweight. ? A BMI of 18.5 to 24.9 is normal. ? A BMI of 25 to 29.9 is considered overweight. ? A BMI of 30 and above is considered obese.   . Maintain normal blood lipids and cholesterol levels by exercising and minimizing your intake of saturated fat. Eat a balanced diet with plenty of fruit and vegetables. Blood tests for lipids and cholesterol should begin at age 15 and be repeated every 5 years. If your lipid or cholesterol levels are high, you are over 50, or you are at high risk for heart disease, you  may need your cholesterol levels checked more frequently. Ongoing high lipid and cholesterol levels should be treated with medicines if diet and exercise are not working.  . If you smoke, find out from your health care provider how to quit. If you do not use tobacco, please do not start.  . If you choose to drink alcohol, please do not consume more than 2 drinks per day. One drink is considered to be 12 ounces (355 mL) of beer, 5 ounces (148 mL) of wine, or 1.5 ounces (44 mL) of liquor.  . If you are 14-56 years old, ask your health care provider if you should take  aspirin to prevent strokes.  . Use sunscreen. Apply sunscreen liberally and repeatedly throughout the day. You should seek shade when your shadow is shorter than you. Protect yourself by wearing long sleeves, pants, a wide-brimmed hat, and sunglasses year round, whenever you are outdoors.  . Once a month, do a whole body skin exam, using a mirror to look at the skin on your back. Tell your health care provider of new moles, moles that have irregular borders, moles that are larger than a pencil eraser, or moles that have changed in shape or color.

## 2018-04-19 NOTE — Progress Notes (Signed)
PCP notes:   Health maintenance:  Flu vaccine - addressed  Abnormal screenings:   Hearing - failed  Hearing Screening   125Hz  250Hz  500Hz  1000Hz  2000Hz  3000Hz  4000Hz  6000Hz  8000Hz   Right ear:   40 40 40  0    Left ear:   40 40 40  0     Patient concerns:   None  Nurse concerns:  None  Next PCP appt:   04/23/18 @ 0820  I reviewed health advisor's note, was available for consultation, and agree with documentation and plan. Loura Pardon MD

## 2018-04-19 NOTE — Progress Notes (Signed)
Subjective:   Curtis Black. is a 77 y.o. male who presents for Medicare Annual (Subsequent) preventive examination.  Review of Systems:  N/A Cardiac Risk Factors include: advanced age (>14men, >65 women);dyslipidemia;hypertension;obesity (BMI >30kg/m2);male gender     Objective:     Vitals: BP 126/84 (BP Location: Right Arm, Patient Position: Sitting, Cuff Size: Normal)   Pulse (!) 45   Temp 98.2 F (36.8 C) (Oral)   Ht 6\' 2"  (1.88 m) Comment: shoes  Wt 242 lb 8 oz (110 kg)   SpO2 92%   BMI 31.14 kg/m   Body mass index is 31.14 kg/m.  Advanced Directives 04/19/2018 03/30/2017  Does Patient Have a Medical Advance Directive? Yes Yes  Type of Paramedic of [place];Living will [place];Living will  Does patient want to make changes to medical advance directive? - No - Patient declined  Copy of [place] in Chart? No - copy requested No - copy requested    Tobacco Social History   Tobacco Use  Smoking Status Former Smoker  Smokeless Tobacco Never Used     Counseling given: No   Clinical Intake:  Pre-visit preparation completed: Yes  Pain : No/denies pain Pain Score: 0-No pain     Nutritional Status: BMI > 30  Obese Nutritional Risks: None Diabetes: No  How often do you need to have someone help you when you read instructions, pamphlets, or other written materials from your doctor or pharmacy?: 1 - Never What is the last grade level you completed in school?: PhD in [place]?: No  Comments: pt lives with spouse Information entered by :: LPinson, LPN  Past Medical History:  Diagnosis Date  . Anxiety   . Basal cell carcinoma (BCC) of nostril    Removed, no other intervention needed per pt.  . Essential hypertension   . Hyperlipidemia   . Prostate cancer ([place]) 2009   Past Surgical History:  Procedure Laterality Date  . APPENDECTOMY    . BOWEL RESECTION    . COLON  SURGERY    . COLONOSCOPY WITH PROPOFOL N/A 06/05/2017   Procedure: COLONOSCOPY WITH PROPOFOL;  Surgeon: Jonathon Bellows, MD;  Location: [facility] END[facility]COPY;  Service: Gastroenterology;  Laterality: N/A;  . KNEE ARTHROPLASTY Right   . PROSTATECTOMY    . thyroid biop     Family History  Problem Relation Age of Onset  . Diabetes Sister    Social History   Socioeconomic History  . Marital status: Married    Spouse name: Not on file  . Number of children: Not on file  . Years of education: Not on file  . Highest education level: Not on file  Occupational History  . Not on file  Social Needs  . Financial resource strain: Not on file  . Food insecurity:    Worry: Not on file    Inability: Not on file  . Transportation needs:    Medical: Not on file    Non-medical: Not on file  Tobacco Use  . Smoking status: Former Research scientist (life sciences)  . Smokeless tobacco: Never Used  Substance and Sexual Activity  . Alcohol use: Yes    Alcohol/week: 7.0 standard drinks    Types: 7 Glasses of wine per week  . Drug use: Not Currently  . Sexual activity: Not Currently  Lifestyle  . Physical activity:    Days per week: Not on file    Minutes per session: Not on file  .  Stress: Not on file  Relationships  . Social connections:    Talks on phone: Not on file    Gets together: Not on file    Attends religious service: Not on file    Active member of club or organization: Not on file    Attends meetings of clubs or organizations: Not on file    Relationship status: Not on file  Other Topics Concern  . Not on file  Social History Narrative   Married.   No children.   Retired. Once worked for [employer].   Enjoys working on his house, playing golf, riding his bike.     Outpatient Encounter Medications as of 04/19/2018  Medication Sig  . amLODipine (NORVASC) 5 MG tablet TAKE 1 TABLET BY MOUTH ONCE DAILY FOR BLOOD PRESSURE  . aspirin EC 81 MG tablet Take 81 mg by mouth daily.  Marland Kitchen atorvastatin (LIPITOR) 20 MG tablet  Take 1 tablet (20 mg total) by mouth daily. COMPLETE PHYSICAL EXAM REQUIRED FOR ADDITIONAL REFILLS  . Coenzyme Q10 (COQ10 PO) Take 100 capsules by mouth daily.  . fenofibrate micronized (LOFIBRA) 134 MG capsule TAKE 1 CAPSULE DAILY BEFOREBREAKFAST  . GLUCOSAMINE SULFATE PO Take by mouth.  . IRON PO Take by mouth.  . losartan-hydrochlorothiazide (HYZAAR) 100-12.5 MG tablet TAKE 1 TABLET DAILY  . Multiple Vitamin (MULTIVITAMIN) tablet Take 1 tablet by mouth daily.  . Omega-3 Fatty Acids (FISH OIL) 1000 MG CAPS Take 1,000 mg by mouth 2 (two) times daily.  . sertraline (ZOLOFT) 50 MG tablet Take 1 tablet (50 mg total) by mouth daily.  Marland Kitchen Zoster Vac Recomb Adjuvanted [facility]) injection Inject into the muscle once. Repeat 2-6 months after the first vaccination.  . [DISCONTINUED] olmesartan-hydrochlorothiazide (BENICAR HCT) 40-12.5 MG tablet Take by mouth.  . [DISCONTINUED] simvastatin (ZOCOR) 40 MG tablet    No facility-administered encounter medications on file as of 04/19/2018.     Activities of Daily Living In your present state of health, do you have any difficulty performing the following activities: 04/19/2018  Hearing? Y  Vision? N  Difficulty concentrating or making decisions? N  Walking or climbing stairs? N  Dressing or bathing? N  Doing errands, shopping? N  Preparing Food and eating ? N  Using the Toilet? N  In the past six months, have you accidently leaked urine? N  Do you have problems with loss of bowel control? N  Managing your Medications? N  Managing your Finances? N  Housekeeping or managing your Housekeeping? N  Some recent data might be hidden    Patient Care Team: Pleas Koch, NP as PCP - General (Internal Medicine) Payton Spark as Consulting Physician (Dentistry) Ralene Bathe, MD (Dermatology)    Assessment:   This is a routine wellness examination for Curtis Black.   Hearing Screening   125Hz  250Hz  500Hz  1000Hz  2000Hz  3000Hz  4000Hz  6000Hz  8000Hz     Right ear:   40 40 40  0    Left ear:   40 40 40  0    Vision Screening Comments: Vision exam in March 2019 with Dr. Wallace Black    Exercise Activities and Dietary recommendations Current Exercise Habits: Home exercise routine, Type of exercise: Other - see comments;strength training/weights(run 9 miles every other day; golf 3x/wk), Time (Minutes): > 60, Frequency (Times/Week): 6, Weekly Exercise (Minutes/Week): 0, Intensity: Moderate  Goals    . Exercise 6x per week (30 min per time)     Starting 04/19/2018, I will continue working out 6x per week  for 1.5-5 hours per day.         Fall Risk Fall Risk  04/19/2018 03/30/2017  Falls in the past year? No No   Depression Screen PHQ 2/9 Scores 04/19/2018 03/30/2017  PHQ - 2 Score 0 0  PHQ- 9 Score 0 -     Cognitive Function MMSE - Mini Mental State Exam 04/19/2018 03/30/2017  Orientation to time 5 5  Orientation to Place 5 5  Registration 3 3  Attention/ Calculation 0 0  Recall 3 3  Language- name 2 objects 0 0  Language- repeat 1 1  Language- follow 3 step command 3 3  Language- read & follow direction 0 0  Write a sentence 0 0  Copy design 0 0  Total score 20 20     PLEASE NOTE: A Mini-Cog screen was completed. Maximum score is 20. A value of 0 denotes this part of [place] MMSE was not completed or the patient failed this part of the Mini-Cog screening.   Mini-Cog Screening Orientation to Time - Max 5 pts Orientation to Place - Max 5 pts Registration - Max 3 pts Recall - Max 3 pts Language Repeat - Max 1 pts Language Follow 3 Step Command - Max 3 pts     Immunization History  Administered Date(s) Administered  . Influenza, High Dose Seasonal PF 06/13/2017  . Pneumococcal Conjugate-13 04/06/2017  . Pneumococcal Polysaccharide-23 01/05/2010  . Tdap 11/13/2017  . Zoster 08/28/2014    Screening Tests Health Maintenance  Topic Date Due  . INFLUENZA VACCINE  11/27/2018 (Originally 03/28/2018)  . COLONOSCOPY  06/05/2020   . TETANUS/TDAP  11/14/2027  . PNA vac Low Risk Adult  Completed       Plan:    I have personally reviewed, addressed, and noted the following in the patient's chart:  A. Medical and social history B. Use of alcohol, tobacco or illicit drugs  C. Current medications and supplements D. Functional ability and status E.  Nutritional status F.  Physical activity G. Advance directives H. List of other physicians I.  Hospitalizations, surgeries, and ER visits in previous 12 months J.  [place] to include hearing, vision, cognitive, depression L. Referrals and appointments - none  In addition, I have reviewed and discussed with patient certain preventive protocols, quality metrics, and best practice recommendations. A written personalized care plan for preventive services as well as general preventive health recommendations were provided to patient.  See attached scanned questionnaire for additional information.   Signed,   Lindell Noe, MHA, BS, LPN Health Coach

## 2018-04-23 ENCOUNTER — Encounter: Payer: Self-pay | Admitting: Primary Care

## 2018-04-23 ENCOUNTER — Ambulatory Visit (INDEPENDENT_AMBULATORY_CARE_PROVIDER_SITE_OTHER): Payer: Medicare Other | Admitting: Primary Care

## 2018-04-23 DIAGNOSIS — R7303 Prediabetes: Secondary | ICD-10-CM

## 2018-04-23 DIAGNOSIS — E785 Hyperlipidemia, unspecified: Secondary | ICD-10-CM

## 2018-04-23 DIAGNOSIS — I10 Essential (primary) hypertension: Secondary | ICD-10-CM | POA: Diagnosis not present

## 2018-04-23 DIAGNOSIS — F411 Generalized anxiety disorder: Secondary | ICD-10-CM

## 2018-04-23 DIAGNOSIS — N183 Chronic kidney disease, stage 3 unspecified: Secondary | ICD-10-CM

## 2018-04-23 MED ORDER — SERTRALINE HCL 50 MG PO TABS: 50.0000 mg | ORAL_TABLET | Freq: Every day | ORAL | 3 refills | Status: DC

## 2018-04-23 MED ORDER — ATORVASTATIN CALCIUM 20 MG PO TABS: ORAL_TABLET | ORAL | 3 refills | Status: DC

## 2018-04-23 MED ORDER — FENOFIBRATE MICRONIZED 134 MG PO CAPS: ORAL_CAPSULE | ORAL | 3 refills | Status: DC

## 2018-04-23 MED ORDER — LOSARTAN POTASSIUM-HCTZ 100-12.5 MG PO TABS: ORAL_TABLET | ORAL | 3 refills | Status: DC

## 2018-04-23 NOTE — Assessment & Plan Note (Signed)
Stable in the office today, continue Hyzaar 100-12.5 mg and Amlodipine 5 mg. BMP overall stable, will monitor renal function, repeat in 3 months. Consider removing HCTZ if no improvement.

## 2018-04-23 NOTE — Progress Notes (Signed)
Subjective:    Patient ID: Curtis Black., male    DOB: 09-23-1940, 77 y.o.   MRN: [id number]  HPI  Curtis Black is a 77 year old male who presents today for [place] Part 2.  BP Readings from Last 3 Encounters:  04/19/18 126/84  06/05/17 (!) 141/78  04/06/17 136/84     Immunizations: -Tetanus: Completed in 2019 -Influenza: Completed last season, due again this season -Pneumonia: Completed Prevnar in 2018, pneumovax in 2011 -Shingles: Completed in 2016  Colonoscopy: Completed in 2018 PSA: Recent level consistent with prostatectomy   Diet currently consists of:  Breakfast: Egg, toast with peanut butter, cereal with skim milk Lunch: Restaurants, salad, grilled protein, vegetable, pasta Dinner: Salad, bread Snacks: None Desserts: Infrequently Beverages: Water, wine  Exercise: Exercising three days weekly at the gym. Plays golf three times weekly.    Review of Systems  Constitutional: Negative for unexpected weight change.  HENT: Negative for rhinorrhea.   Respiratory: Negative for cough and shortness of breath.   Cardiovascular: Negative for chest pain.  Gastrointestinal: Negative for constipation and diarrhea.  Genitourinary: Negative for difficulty urinating.  Musculoskeletal: Negative for arthralgias and myalgias.  Skin: Negative for rash.  Allergic/Immunologic: Negative for environmental allergies.  Neurological: Negative for dizziness, numbness and headaches.  Psychiatric/Behavioral:       Overall doing well on Zoloft.        Past Medical History:  Diagnosis Date  . Anxiety   . Basal cell carcinoma (BCC) of nostril    Removed, no other intervention needed per pt.  . CKD (chronic kidney disease) stage 3, GFR 30-59 ml/min (HCC)   . Essential hypertension   . Hyperlipidemia   . Prostate cancer [facility]) 2009     Social History   Socioeconomic History  . Marital status: Married    Spouse name: Not on file  . Number of children: Not on file  . Years of  education: Not on file  . Highest education level: Not on file  Occupational History  . Not on file  Social Needs  . Financial resource strain: Not on file  . Food insecurity:    Worry: Not on file    Inability: Not on file  . Transportation needs:    Medical: Not on file    Non-medical: Not on file  Tobacco Use  . Smoking status: Former Research scientist (life sciences)  . Smokeless tobacco: Never Used  Substance and Sexual Activity  . Alcohol use: Yes    Alcohol/week: 7.0 standard drinks    Types: 7 Glasses of wine per week  . Drug use: Not Currently  . Sexual activity: Not Currently  Lifestyle  . Physical activity:    Days per week: Not on file    Minutes per session: Not on file  . Stress: Not on file  Relationships  . Social connections:    Talks on phone: Not on file    Gets together: Not on file    Attends religious service: Not on file    Active member of club or organization: Not on file    Attends meetings of clubs or organizations: Not on file    Relationship status: Not on file  . Intimate partner violence:    Fear of current or ex partner: Not on file    Emotionally abused: Not on file    Physically abused: Not on file    Forced sexual activity: Not on file  Other Topics Concern  . Not on file  Social History Narrative   Married.   No children.   Retired. Once worked for [employer].   Enjoys working on his house, playing golf, riding his bike.     Past Surgical History:  Procedure Laterality Date  . APPENDECTOMY    . BOWEL RESECTION    . COLON SURGERY    . COLONOSCOPY WITH PROPOFOL N/A 06/05/2017   Procedure: COLONOSCOPY WITH PROPOFOL;  Surgeon: Jonathon Bellows, MD;  Location: [facility] ENDOSCOPY;  Service: Gastroenterology;  Laterality: N/A;  . KNEE ARTHROPLASTY Right   . PROSTATECTOMY    . thyroid biop      Family History  Problem Relation Age of Onset  . Diabetes Sister     No Known Allergies  Current Outpatient Medications on File Prior to Visit  Medication Sig Dispense  Refill  . amLODipine (NORVASC) 5 MG tablet TAKE 1 TABLET BY MOUTH ONCE DAILY FOR BLOOD PRESSURE 90 tablet 3  . aspirin EC 81 MG tablet Take 81 mg by mouth daily.    . Coenzyme Q10 (COQ10 PO) Take 100 capsules by mouth daily.    Marland Kitchen GLUCOSAMINE SULFATE PO Take by mouth.    . IRON PO Take by mouth.    . Multiple Vitamin (MULTIVITAMIN) tablet Take 1 tablet by mouth daily.    . Omega-3 Fatty Acids (FISH OIL) 1000 MG CAPS Take 1,000 mg by mouth 2 (two) times daily.    Marland Kitchen Zoster Vac Recomb Adjuvanted [facility]) injection Inject into the muscle once. Repeat 2-6 months after the first vaccination. 0.5 mL 1   No current facility-administered medications on file prior to visit.     BP 128/84   Pulse (!) 54   Temp 98 F (36.7 C) (Oral)   Ht 6\' 2"  (1.88 m)   Wt 242 lb 8 oz (110 kg)   SpO2 98%   BMI 31.14 kg/m    Objective:   Physical Exam  Constitutional: He is oriented to person, place, and time. He appears well-nourished.  HENT:  Mouth/Throat: No oropharyngeal exudate.  Eyes: Pupils are equal, round, and reactive to light. EOM are normal.  Neck: Neck supple. No thyromegaly present.  Cardiovascular: Normal rate and regular rhythm.  Respiratory: Effort normal and breath sounds normal.  GI: Soft. Bowel sounds are normal. There is no tenderness.  Musculoskeletal: Normal range of motion.  Neurological: He is alert and oriented to person, place, and time.  Skin: Skin is warm and dry.  Psychiatric: He has a normal mood and affect.           Assessment & Plan:

## 2018-04-23 NOTE — Assessment & Plan Note (Signed)
Recent lipid panel stable, continue fenofibrate and atorvastatin. Refills sent to pharmacy.

## 2018-04-23 NOTE — Assessment & Plan Note (Signed)
Slight decrease in renal function when compared to last year, overall stable over the years. Repeat BMP in 3 months. Also discussed to avoid nephrotoxic agents. Consider removing HCTZ from BP medication regimen.

## 2018-04-23 NOTE — Assessment & Plan Note (Signed)
Stable on Zoloft 50 mg, continue same. Refill sent to pharmacy. He denies SI/HI.

## 2018-04-23 NOTE — Assessment & Plan Note (Signed)
Recent A1C of 6.0.  Discussed to work on diet, continue exercising. Repeat A1C in 3 months.

## 2018-04-23 NOTE — Patient Instructions (Signed)
Continue exercising. You should be getting 150 minutes of moderate intensity exercise weekly.  It's important to improve your diet by reducing consumption of fast food, fried food, processed snack foods, sugary drinks. Increase consumption of fresh vegetables and fruits, whole grains, water.  Ensure you are drinking 64 ounces of water daily.  Please return for your flu shot this Fall.   Avoid use of medications such as Ibuprofen, Motrin, Aleve, naproxen, Advil as they can be toxic to the kidneys.  Schedule a lab only appointment in 3 months for repeat diabetes test and kidney function.  Follow up in 1 year for your annual exam or sooner if needed.  It was a pleasure to see you today!

## 2018-05-01 ENCOUNTER — Other Ambulatory Visit: Payer: Self-pay

## 2018-05-01 MED ORDER — MELOXICAM 15 MG PO TABS: 15.0000 mg | ORAL_TABLET | Freq: Every day | ORAL | 3 refills | Status: DC

## 2018-05-01 NOTE — Telephone Encounter (Signed)
Pharmacy refill request for Meloxicam.  Per Dr. Evans verbal order, ok to refill.  Script has been sent to pharmacy 

## 2018-05-28 DIAGNOSIS — Z23 Encounter for immunization: Secondary | ICD-10-CM | POA: Diagnosis not present

## 2018-07-15 ENCOUNTER — Other Ambulatory Visit: Payer: Self-pay | Admitting: Primary Care

## 2018-07-15 DIAGNOSIS — N183 Chronic kidney disease, stage 3 unspecified: Secondary | ICD-10-CM

## 2018-07-15 DIAGNOSIS — R7303 Prediabetes: Secondary | ICD-10-CM

## 2018-07-23 ENCOUNTER — Other Ambulatory Visit (INDEPENDENT_AMBULATORY_CARE_PROVIDER_SITE_OTHER): Payer: Medicare Other

## 2018-07-23 DIAGNOSIS — N183 Chronic kidney disease, stage 3 unspecified: Secondary | ICD-10-CM

## 2018-07-23 DIAGNOSIS — R7303 Prediabetes: Secondary | ICD-10-CM | POA: Diagnosis not present

## 2018-07-23 LAB — BASIC METABOLIC PANEL
BUN: 21 mg/dL (ref 6–23)
CHLORIDE: 101 meq/L (ref 96–112)
CO2: 30 mEq/L (ref 19–32)
CREATININE: 1.47 mg/dL (ref 0.40–1.50)
Calcium: 10.5 mg/dL (ref 8.4–10.5)
GFR: 49.58 mL/min — ABNORMAL LOW (ref >60.00)
GLUCOSE: 122 mg/dL — AB (ref 70–99)
Potassium: 3.9 mEq/L (ref 3.5–5.1)
Sodium: 140 mEq/L (ref 135–145)

## 2018-07-23 LAB — HEMOGLOBIN A1C: Hgb A1c MFr Bld: 5.8 % (ref 4.6–6.5)

## 2018-07-24 ENCOUNTER — Other Ambulatory Visit: Payer: Self-pay | Admitting: Primary Care

## 2018-07-24 DIAGNOSIS — I10 Essential (primary) hypertension: Secondary | ICD-10-CM

## 2018-07-24 MED ORDER — LOSARTAN POTASSIUM 100 MG PO TABS: 100.0000 mg | ORAL_TABLET | Freq: Every day | ORAL | 0 refills | Status: DC

## 2018-09-20 DIAGNOSIS — I10 Essential (primary) hypertension: Secondary | ICD-10-CM

## 2018-09-20 MED ORDER — AMLODIPINE BESYLATE 5 MG PO TABS: 10.0000 mg | ORAL_TABLET | Freq: Every day | ORAL | 3 refills | Status: DC

## 2018-09-22 NOTE — Telephone Encounter (Signed)
Curtis Black, will you cancel patient's current lab appointment and schedule an office visit with me for either February 6th or 7th? Thanks!

## 2018-10-01 ENCOUNTER — Other Ambulatory Visit: Payer: Medicare Other

## 2018-10-03 ENCOUNTER — Encounter: Payer: Self-pay | Admitting: Primary Care

## 2018-10-03 ENCOUNTER — Ambulatory Visit (INDEPENDENT_AMBULATORY_CARE_PROVIDER_SITE_OTHER): Payer: Medicare Other | Admitting: Primary Care

## 2018-10-03 VITALS — BP 162/76 | HR 47 | Temp 98.2°F | Ht 74.0 in | Wt 241.8 lb

## 2018-10-03 DIAGNOSIS — N183 Chronic kidney disease, stage 3 unspecified: Secondary | ICD-10-CM

## 2018-10-03 DIAGNOSIS — I10 Essential (primary) hypertension: Secondary | ICD-10-CM | POA: Diagnosis not present

## 2018-10-03 LAB — BASIC METABOLIC PANEL
BUN: 20 mg/dL (ref 6–23)
CHLORIDE: 105 meq/L (ref 96–112)
CO2: 31 meq/L (ref 19–32)
CREATININE: 1.28 mg/dL (ref 0.40–1.50)
Calcium: 9.8 mg/dL (ref 8.4–10.5)
GFR: 54.69 mL/min — ABNORMAL LOW (ref >60.00)
Glucose, Bld: 95 mg/dL (ref 70–99)
POTASSIUM: 4.2 meq/L (ref 3.5–5.1)
Sodium: 140 mEq/L (ref 135–145)

## 2018-10-03 NOTE — Progress Notes (Signed)
Subjective:    Patient ID: [name], male    DOB: April 02, 1941, 78 y.o.   MRN: [id number]  HPI  Mr. Keijuan Schellhase is a 78 year old male who presents today for follow up of decreased renal function and hypertension.  He was last evaluated on 07/23/18, noted to have a continued decrease in renal function without further decline. At that time HCTZ was removed from his losartan-HCTZ in hopes to reduce taxation of the kidneys. He was continued on losartan 100 mg and Amlodipine 5 mg was continued.  He sent a message via My Chart on 09/20/18 with reports of elevated BP readings in the 160's/100's with HR in the high 50's. Given this information he was asked to increase his amlodipine to 10 mg.   Since his last visit he's checking his BP which is running 130-180/80-90's. He has noticed some improvement with the increase in Amlodipine to 10 mg. He's not taking NSAID's. He endorses some water intake, doesn't think he gets enough. He has noticed a few episodes of vertigo in the morning over the last 2 months. Denies chest pain, dizziness otherwise, shortness of breath.   BP Readings from Last 3 Encounters:  10/03/18 (!) 162/76  04/23/18 128/84  04/19/18 126/84     Review of Systems  Constitutional: Negative for fatigue.  Respiratory: Negative for shortness of breath.   Cardiovascular: Negative for chest pain and leg swelling.  Neurological: Negative for dizziness and headaches.       Past Medical History:  Diagnosis Date  . Anxiety   . Basal cell carcinoma (BCC) of nostril    Removed, no other intervention needed per pt.  . CKD (chronic kidney disease) stage 3, GFR 30-59 ml/min (HCC)   . Essential hypertension   . Hyperlipidemia   . Prostate cancer [facility]) 2009     Social History   Socioeconomic History  . Marital status: Married    Spouse name: Not on file  . Number of children: Not on file  . Years of education: Not on file  . Highest education level: Not on file    Occupational History  . Not on file  Social Needs  . Financial resource strain: Not on file  . Food insecurity:    Worry: Not on file    Inability: Not on file  . Transportation needs:    Medical: Not on file    Non-medical: Not on file  Tobacco Use  . Smoking status: Former Research scientist (life sciences)  . Smokeless tobacco: Never Used  Substance and Sexual Activity  . Alcohol use: Yes    Alcohol/week: 7.0 standard drinks    Types: 7 Glasses of wine per week  . Drug use: Not Currently  . Sexual activity: Not Currently  Lifestyle  . Physical activity:    Days per week: Not on file    Minutes per session: Not on file  . Stress: Not on file  Relationships  . Social connections:    Talks on phone: Not on file    Gets together: Not on file    Attends religious service: Not on file    Active member of club or organization: Not on file    Attends meetings of clubs or organizations: Not on file    Relationship status: Not on file  . Intimate partner violence:    Fear of current or ex partner: Not on file    Emotionally abused: Not on file    Physically abused: Not on file  Forced sexual activity: Not on file  Other Topics Concern  . Not on file  Social History Narrative   Married.   No children.   Retired. Once worked for [employer].   Enjoys working on his house, playing golf, riding his bike.     Past Surgical History:  Procedure Laterality Date  . APPENDECTOMY    . BOWEL RESECTION    . COLON SURGERY    . COLONOSCOPY WITH PROPOFOL N/A 06/05/2017   Procedure: COLONOSCOPY WITH PROPOFOL;  Surgeon: Jonathon Bellows, MD;  Location: [facility] ENDOSCOPY;  Service: Gastroenterology;  Laterality: N/A;  . KNEE ARTHROPLASTY Right   . PROSTATECTOMY    . thyroid biop      Family History  Problem Relation Age of Onset  . Diabetes Sister     No Known Allergies  Current Outpatient Medications on File Prior to Visit  Medication Sig Dispense Refill  . amLODipine (NORVASC) 5 MG tablet Take 2 tablets (10  mg total) by mouth daily. 90 tablet 3  . aspirin EC 81 MG tablet Take 81 mg by mouth daily.    Marland Kitchen atorvastatin (LIPITOR) 20 MG tablet Take 1 tablet by mouth once daily for cholesterol. 90 tablet 3  . Coenzyme Q10 (COQ10 PO) Take 100 capsules by mouth daily.    . fenofibrate micronized (LOFIBRA) 134 MG capsule TAKE 1 CAPSULE DAILY BEFORE BREAKFAST 90 capsule 3  . GLUCOSAMINE SULFATE PO Take by mouth.    . IRON PO Take by mouth.    . losartan (COZAAR) 100 MG tablet Take 1 tablet (100 mg total) by mouth daily. For blood pressure. 90 tablet 0  . meloxicam (MOBIC) 15 MG tablet Take 1 tablet (15 mg total) by mouth daily. 30 tablet 3  . Multiple Vitamin (MULTIVITAMIN) tablet Take 1 tablet by mouth daily.    . Omega-3 Fatty Acids (FISH OIL) 1000 MG CAPS Take 1,000 mg by mouth 2 (two) times daily.    . sertraline (ZOLOFT) 50 MG tablet Take 1 tablet (50 mg total) by mouth daily. 90 tablet 3  . Zoster Vac Recomb Adjuvanted [facility]) injection Inject into the muscle once. Repeat 2-6 months after the first vaccination. 0.5 mL 1   No current facility-administered medications on file prior to visit.     BP (!) 162/76   Pulse (!) 47   Temp 98.2 F (36.8 C) (Oral)   Ht 6\' 2"  (1.88 m)   Wt 241 lb 12 oz (109.7 kg)   SpO2 96%   BMI 31.04 kg/m    Objective:   Physical Exam  Constitutional: He appears well-nourished.  Neck: Neck supple.  Cardiovascular: Normal rate and regular rhythm.  Respiratory: Effort normal and breath sounds normal.  Skin: Skin is warm and dry.  Psychiatric: He has a normal mood and affect.           Assessment & Plan:

## 2018-10-03 NOTE — Patient Instructions (Signed)
Stop by the lab prior to leaving today. I will notify you of your results once received.   Continue Amlodipine 10 mg and Losartan 100 mg daily for blood pressure.  I'll be in touch via My Chart later today or tomorrow.  It was a pleasure to see you today!

## 2018-10-03 NOTE — Assessment & Plan Note (Signed)
Above goal on Amlodipine 10 mg and Losartan 100 mg. Did very well with HCTZ 12.5 mg which was removed due to CKD. Will repeat renal function today, if no improvement after removing HCTZ then we will add it back for better BP control. Labs pending. Follow up to be determined.

## 2018-10-07 ENCOUNTER — Other Ambulatory Visit: Payer: Self-pay | Admitting: Primary Care

## 2018-10-07 DIAGNOSIS — I10 Essential (primary) hypertension: Secondary | ICD-10-CM

## 2018-11-23 DIAGNOSIS — I10 Essential (primary) hypertension: Secondary | ICD-10-CM

## 2018-11-25 MED ORDER — AMLODIPINE BESYLATE 10 MG PO TABS: 10.0000 mg | ORAL_TABLET | Freq: Every day | ORAL | 3 refills | Status: DC

## 2019-01-16 DIAGNOSIS — L918 Other hypertrophic disorders of the skin: Secondary | ICD-10-CM | POA: Diagnosis not present

## 2019-01-16 DIAGNOSIS — Z85828 Personal history of other malignant neoplasm of skin: Secondary | ICD-10-CM | POA: Diagnosis not present

## 2019-01-16 DIAGNOSIS — D225 Melanocytic nevi of trunk: Secondary | ICD-10-CM | POA: Diagnosis not present

## 2019-01-16 DIAGNOSIS — L570 Actinic keratosis: Secondary | ICD-10-CM | POA: Diagnosis not present

## 2019-01-16 DIAGNOSIS — L578 Other skin changes due to chronic exposure to nonionizing radiation: Secondary | ICD-10-CM | POA: Diagnosis not present

## 2019-01-16 DIAGNOSIS — L812 Freckles: Secondary | ICD-10-CM | POA: Diagnosis not present

## 2019-01-16 DIAGNOSIS — Z1283 Encounter for screening for malignant neoplasm of skin: Secondary | ICD-10-CM | POA: Diagnosis not present

## 2019-01-16 DIAGNOSIS — L821 Other seborrheic keratosis: Secondary | ICD-10-CM | POA: Diagnosis not present

## 2019-01-16 DIAGNOSIS — D1800 Hemangioma unspecified site: Secondary | ICD-10-CM | POA: Diagnosis not present

## 2019-01-16 DIAGNOSIS — L820 Inflamed seborrheic keratosis: Secondary | ICD-10-CM | POA: Diagnosis not present

## 2019-01-16 DIAGNOSIS — L720 Epidermal cyst: Secondary | ICD-10-CM | POA: Diagnosis not present

## 2019-02-04 DIAGNOSIS — L578 Other skin changes due to chronic exposure to nonionizing radiation: Secondary | ICD-10-CM | POA: Diagnosis not present

## 2019-02-04 DIAGNOSIS — L820 Inflamed seborrheic keratosis: Secondary | ICD-10-CM | POA: Diagnosis not present

## 2019-02-04 DIAGNOSIS — L720 Epidermal cyst: Secondary | ICD-10-CM | POA: Diagnosis not present

## 2019-02-11 DIAGNOSIS — L720 Epidermal cyst: Secondary | ICD-10-CM | POA: Diagnosis not present

## 2019-02-27 ENCOUNTER — Other Ambulatory Visit: Payer: Self-pay | Admitting: Primary Care

## 2019-02-27 DIAGNOSIS — I10 Essential (primary) hypertension: Secondary | ICD-10-CM

## 2019-03-14 ENCOUNTER — Other Ambulatory Visit: Payer: Self-pay | Admitting: Primary Care

## 2019-03-14 DIAGNOSIS — E785 Hyperlipidemia, unspecified: Secondary | ICD-10-CM

## 2019-04-24 ENCOUNTER — Other Ambulatory Visit: Payer: Self-pay | Admitting: Primary Care

## 2019-04-24 DIAGNOSIS — N183 Chronic kidney disease, stage 3 unspecified: Secondary | ICD-10-CM

## 2019-04-24 DIAGNOSIS — E785 Hyperlipidemia, unspecified: Secondary | ICD-10-CM

## 2019-04-24 DIAGNOSIS — I10 Essential (primary) hypertension: Secondary | ICD-10-CM

## 2019-04-24 DIAGNOSIS — R7303 Prediabetes: Secondary | ICD-10-CM

## 2019-04-24 DIAGNOSIS — Z125 Encounter for screening for malignant neoplasm of prostate: Secondary | ICD-10-CM

## 2019-04-29 ENCOUNTER — Ambulatory Visit: Payer: Medicare Other

## 2019-04-29 DIAGNOSIS — H2513 Age-related nuclear cataract, bilateral: Secondary | ICD-10-CM | POA: Diagnosis not present

## 2019-05-06 ENCOUNTER — Encounter: Payer: Medicare Other | Admitting: Primary Care

## 2019-05-08 ENCOUNTER — Other Ambulatory Visit (INDEPENDENT_AMBULATORY_CARE_PROVIDER_SITE_OTHER): Payer: Medicare Other

## 2019-05-08 ENCOUNTER — Other Ambulatory Visit: Payer: Self-pay

## 2019-05-08 DIAGNOSIS — E785 Hyperlipidemia, unspecified: Secondary | ICD-10-CM | POA: Diagnosis not present

## 2019-05-08 DIAGNOSIS — I10 Essential (primary) hypertension: Secondary | ICD-10-CM | POA: Diagnosis not present

## 2019-05-08 DIAGNOSIS — Z125 Encounter for screening for malignant neoplasm of prostate: Secondary | ICD-10-CM

## 2019-05-08 DIAGNOSIS — R7303 Prediabetes: Secondary | ICD-10-CM

## 2019-05-08 LAB — COMPREHENSIVE METABOLIC PANEL
ALT: 25 U/L (ref 0–53)
AST: 28 U/L (ref 0–37)
Albumin: 4.8 g/dL (ref 3.5–5.2)
Alkaline Phosphatase: 48 U/L (ref 39–117)
BUN: 29 mg/dL — ABNORMAL HIGH (ref 6–23)
CO2: 29 mEq/L (ref 19–32)
Calcium: 9.8 mg/dL (ref 8.4–10.5)
Chloride: 104 mEq/L (ref 96–112)
Creatinine, Ser: 1.42 mg/dL (ref 0.40–1.50)
GFR: 48.44 mL/min — ABNORMAL LOW (ref >60.00)
Glucose, Bld: 97 mg/dL (ref 70–99)
Potassium: 4.2 mEq/L (ref 3.5–5.1)
Sodium: 141 mEq/L (ref 135–145)
Total Bilirubin: 0.7 mg/dL (ref 0.2–1.2)
Total Protein: 7.5 g/dL (ref 6.0–8.3)

## 2019-05-08 LAB — CBC
HCT: 46.8 % (ref 39.0–52.0)
Hemoglobin: 16 g/dL (ref 13.0–17.0)
MCHC: 34.2 g/dL (ref 30.0–36.0)
MCV: 90.8 fl (ref 78.0–100.0)
Platelets: 267 10*3/uL (ref 150.0–400.0)
RBC: 5.15 Mil/uL (ref 4.22–5.81)
RDW: 13.4 % (ref 11.5–15.5)
WBC: 5.9 10*3/uL (ref 4.0–10.5)

## 2019-05-08 LAB — HEMOGLOBIN A1C: Hgb A1c MFr Bld: 5.9 % (ref 4.6–6.5)

## 2019-05-08 LAB — LIPID PANEL
Cholesterol: 169 mg/dL (ref 0–200)
HDL: 34 mg/dL — ABNORMAL LOW (ref >39.00)
LDL Cholesterol: 95 mg/dL (ref 0–99)
NonHDL: 135.28
Total CHOL/HDL Ratio: 5
Triglycerides: 200 mg/dL — ABNORMAL HIGH (ref 0.0–149.0)
VLDL: 40 mg/dL (ref 0.0–40.0)

## 2019-05-08 LAB — PSA, MEDICARE: PSA: 0 ng/ml — ABNORMAL LOW (ref 0.10–4.00)

## 2019-05-09 ENCOUNTER — Other Ambulatory Visit: Payer: Self-pay | Admitting: Primary Care

## 2019-05-09 DIAGNOSIS — F411 Generalized anxiety disorder: Secondary | ICD-10-CM

## 2019-05-13 DIAGNOSIS — Z23 Encounter for immunization: Secondary | ICD-10-CM | POA: Diagnosis not present

## 2019-05-15 ENCOUNTER — Encounter: Payer: Self-pay | Admitting: Primary Care

## 2019-05-15 ENCOUNTER — Ambulatory Visit (INDEPENDENT_AMBULATORY_CARE_PROVIDER_SITE_OTHER): Payer: Medicare Other | Admitting: Primary Care

## 2019-05-15 ENCOUNTER — Other Ambulatory Visit: Payer: Self-pay

## 2019-05-15 VITALS — BP 148/78 | HR 51 | Temp 97.7°F | Ht 74.0 in | Wt 238.5 lb

## 2019-05-15 DIAGNOSIS — E785 Hyperlipidemia, unspecified: Secondary | ICD-10-CM

## 2019-05-15 DIAGNOSIS — I10 Essential (primary) hypertension: Secondary | ICD-10-CM | POA: Diagnosis not present

## 2019-05-15 DIAGNOSIS — F411 Generalized anxiety disorder: Secondary | ICD-10-CM

## 2019-05-15 DIAGNOSIS — R7303 Prediabetes: Secondary | ICD-10-CM

## 2019-05-15 DIAGNOSIS — Z0000 Encounter for general adult medical examination without abnormal findings: Secondary | ICD-10-CM | POA: Diagnosis not present

## 2019-05-15 DIAGNOSIS — N183 Chronic kidney disease, stage 3 unspecified: Secondary | ICD-10-CM

## 2019-05-15 NOTE — Assessment & Plan Note (Signed)
Recent lipid panel stable. Continue atorvastatin. Encouraged regular exercise, healthy diet.

## 2019-05-15 NOTE — Assessment & Plan Note (Signed)
Recent A1C about the same as last check. Encouraged regular exercise, healthy diet. Continue to monitor.

## 2019-05-15 NOTE — Progress Notes (Signed)
Patient ID: Curtis Rankin., male   DOB: 19-Sep-1940, 78 y.o.   MRN: [id number]  Curtis Black is a 78 year old male who presents today for [place].  HPI:  Past Medical History:  Diagnosis Date  . Anxiety   . Basal cell carcinoma (BCC) of nostril    Removed, no other intervention needed per pt.  . CKD (chronic kidney disease) stage 3, GFR 30-59 ml/min (HCC)   . Essential hypertension   . Hyperlipidemia   . Prostate cancer [facility]) 2009    Current Outpatient Medications  Medication Sig Dispense Refill  . amLODipine (NORVASC) 10 MG tablet Take 1 tablet (10 mg total) by mouth daily. For blood pressure. 90 tablet 3  . aspirin EC 81 MG tablet Take 81 mg by mouth daily.    Marland Kitchen atorvastatin (LIPITOR) 20 MG tablet TAKE 1 TABLET DAILY FOR    CHOLESTEROL 90 tablet 0  . Coenzyme Q10 (COQ10 PO) Take 100 capsules by mouth daily.    . fenofibrate micronized (LOFIBRA) 134 MG capsule TAKE 1 CAPSULE DAILY BEFORE BREAKFAST 90 capsule 3  . GLUCOSAMINE SULFATE PO Take by mouth.    . IRON PO Take by mouth.    . losartan (COZAAR) 100 MG tablet TAKE 1 TABLET DAILY FOR    BLOOD PRESSURE 90 tablet 1  . meloxicam (MOBIC) 15 MG tablet Take 1 tablet (15 mg total) by mouth daily. 30 tablet 3  . Multiple Vitamin (MULTIVITAMIN) tablet Take 1 tablet by mouth daily.    . Omega-3 Fatty Acids (FISH OIL) 1000 MG CAPS Take 1,000 mg by mouth 2 (two) times daily.    . sertraline (ZOLOFT) 50 MG tablet TAKE 1 TABLET BY MOUTH EVERY DAY 90 tablet 1  . Zoster Vac Recomb Adjuvanted [facility]) injection Inject into the muscle once. Repeat 2-6 months after the first vaccination. 0.5 mL 1   No current facility-administered medications for this visit.     No Known Allergies  Family History  Problem Relation Age of Onset  . Diabetes Sister     Social History   Socioeconomic History  . Marital status: Married    Spouse name: Not on file  . Number of children: Not on file  . Years of education: Not on file  . Highest education  level: Not on file  Occupational History  . Not on file  Social Needs  . Financial resource strain: Not on file  . Food insecurity    Worry: Not on file    Inability: Not on file  . Transportation needs    Medical: Not on file    Non-medical: Not on file  Tobacco Use  . Smoking status: Former Research scientist (life sciences)  . Smokeless tobacco: Never Used  Substance and Sexual Activity  . Alcohol use: Yes    Alcohol/week: 7.0 standard drinks    Types: 7 Glasses of wine per week  . Drug use: Not Currently  . Sexual activity: Not Currently  Lifestyle  . Physical activity    Days per week: Not on file    Minutes per session: Not on file  . Stress: Not on file  Relationships  . Social Herbalist on phone: Not on file    Gets together: Not on file    Attends religious service: Not on file    Active member of club or organization: Not on file    Attends meetings of clubs or organizations: Not on file    Relationship status: Not  on file  . Intimate partner violence    Fear of current or ex partner: Not on file    Emotionally abused: Not on file    Physically abused: Not on file    Forced sexual activity: Not on file  Other Topics Concern  . Not on file  Social History Narrative   Married.   No children.   Retired. Once worked for [employer].   Enjoys working on his house, playing golf, riding his bike.     Hospitiliaztions: None  Health Maintenance:    Flu: Completed  Tetanus: Completed in 2019  Pneumovax: Completed in 2019  Prevnar: Completed in 2018  Shingrix: Completed in 2019  Colonoscopy: Completed in 2018, due in 2021  Black Doctor: Completed in 2020  Dental Exam: Completes semi-annually  PSA: 0.00 in 2020     Providers: Curtis Black, PCP; Dr. Nehemiah Massed, Dermatology, Dr. Amalia Hailey, Podiatry; Black doctor   I have personally reviewed and have noted: 1. The patient's medical and social history 2. Their use of alcohol, tobacco or illicit drugs 3. Their current medications  and supplements 4. The patient's functional ability including ADL's, fall risks, home  safety risks and hearing or visual impairment. 5. Diet and physical activities 6. Evidence for depression or mood disorder  Subjective:   Review of Systems:   Constitutional: Denies fever, malaise, fatigue, headache or abrupt weight changes.  HEENT: Denies Black pain, Black redness, ear pain, ringing in the ears, wax buildup, runny nose, nasal congestion, bloody nose, or sore throat. Respiratory: Denies difficulty breathing, shortness of breath, cough or sputum production.   Cardiovascular: Denies chest pain, chest tightness, palpitations or swelling in the hands or feet.  Gastrointestinal: Denies abdominal pain, bloating, constipation, diarrhea or blood in the stool.  GU: Denies urgency, frequency, pain with urination, burning sensation, blood in urine, odor or discharge. Musculoskeletal: Denies decrease in range of motion, difficulty with gait, muscle pain or joint pain and swelling.  Skin: Denies redness, rashes, lesions or ulcercations.  Neurological: Denies dizziness, difficulty with memory, difficulty with speech or problems with balance and coordination.  Psychiatric: Denies concerns for anxiety or depression.   No other specific complaints in a complete review of systems (except as listed in HPI above).  Objective:  PE:   There were no vitals taken for this visit. Wt Readings from Last 3 Encounters:  10/03/18 241 lb 12 oz (109.7 kg)  04/23/18 242 lb 8 oz (110 kg)  04/19/18 242 lb 8 oz (110 kg)    General: Appears their stated age, well developed, well nourished in NAD. Skin: Warm, dry and intact. No rashes, lesions or ulcerations noted. HEENT: Head: normal shape and size; Eyes: sclera white, no icterus, conjunctiva pink, PERRLA and EOMs intact; Ears: Tm's gray and intact, normal light reflex; Nose: mucosa pink and moist, septum midline; Throat/Mouth: Teeth present, mucosa pink and moist, no  exudate, lesions or ulcerations noted.  Neck: Normal range of motion. Neck supple, trachea midline. No massses, lumps or thyromegaly present.  Cardiovascular: Normal rate and rhythm. S1,S2 noted.  No murmur, rubs or gallops noted. No JVD or BLE edema. No carotid bruits noted. Pulmonary/Chest: Normal effort and positive vesicular breath sounds. No respiratory distress. No wheezes, rales or ronchi noted.  Abdomen: Soft and nontender. Normal bowel sounds, no bruits noted. No distention or masses noted. Liver, spleen and kidneys non palpable. Musculoskeletal: Normal range of motion. No signs of joint swelling. No difficulty with gait.  Neurological: Alert and oriented. Cranial nerves  II-XII intact. Coordination normal. +DTRs bilaterally. Psychiatric: Mood and affect normal. Behavior is normal. Judgment and thought content normal.    BMET    Component Value Date/Time   NA 141 05/08/2019 0800   K 4.2 05/08/2019 0800   CL 104 05/08/2019 0800   CO2 29 05/08/2019 0800   GLUCOSE 97 05/08/2019 0800   BUN 29 (H) 05/08/2019 0800   CREATININE 1.42 05/08/2019 0800   CALCIUM 9.8 05/08/2019 0800    Lipid Panel     Component Value Date/Time   CHOL 169 05/08/2019 0800   TRIG 200.0 (H) 05/08/2019 0800   HDL 34.00 (L) 05/08/2019 0800   CHOLHDL 5 05/08/2019 0800   VLDL 40.0 05/08/2019 0800   LDLCALC 95 05/08/2019 0800    CBC    Component Value Date/Time   WBC 5.9 05/08/2019 0800   RBC 5.15 05/08/2019 0800   HGB 16.0 05/08/2019 0800   HCT 46.8 05/08/2019 0800   PLT 267.0 05/08/2019 0800   MCV 90.8 05/08/2019 0800   MCHC 34.2 05/08/2019 0800   RDW 13.4 05/08/2019 0800   LYMPHSABS 1.9 04/19/2018 0844   MONOABS 0.6 04/19/2018 0844   EOSABS 0.1 04/19/2018 0844   BASOSABS 0.0 04/19/2018 0844    Hgb A1C Lab Results  Component Value Date   HGBA1C 5.9 05/08/2019      Assessment and Plan:   Medicare Annual Wellness Visit:  Diet: He endorses a poor diet. He eats home cooked meals,  larger portions, some take out food. Endorses more carbs/bread. Desserts infrequently. Drinking wine, water, coffee Physical activity: He is playing golf three times weekly Depression/mood screen: Negative Hearing: Intact to whispered voice Visual acuity: Grossly normal, performs annual Black exam  ADLs: Capable Fall risk: None Home safety: Good Cognitive evaluation: Intact to orientation, naming, recall and repetition EOL planning: Adv directives completed  Preventative Medicine: Immunizations UTD Colonoscopy UTD, due in 2021. PSA UTD. Strongly encouraged regular exercise, healthy diet. Exam today unremarkable. Labs reviewed. All recommendations provided at end of visit.  Next appointment:  BP Readings from Last 3 Encounters:  05/15/19 (!) 148/78  10/03/18 (!) 162/76  04/23/18 128/84   He is checking his BP at home which is running 120's-130's/80's.

## 2019-05-15 NOTE — Patient Instructions (Signed)
Start exercising. You should be getting 150 minutes of moderate intensity exercise weekly.  It's important to improve your diet by reducing consumption of fast food, fried food, processed snack foods, sugary drinks. Increase consumption of fresh vegetables and fruits, whole grains, water.  Ensure you are drinking 64 ounces of water daily.  Continue to monitor your blood pressure, it should run less than 135/90.  Schedule a lab only appointment for three months to repeat kidney function.   It was a pleasure to see you today!

## 2019-05-15 NOTE — Assessment & Plan Note (Signed)
Decrease in GFR with stable creatinine on recent labs. Encouraged healthy weight, regular exercise, avoid NSAID's.  Continue to monitor. Repeat in 3 months.

## 2019-05-15 NOTE — Assessment & Plan Note (Signed)
Above goal in the office today, home readings are within normal range. Will have him continue current regimen and monitoring home BP. He will report readings that are at or above 135/90.

## 2019-05-15 NOTE — Assessment & Plan Note (Signed)
Doing well on current dose of sertraline, continue same. Denies SI/HI.

## 2019-05-15 NOTE — Assessment & Plan Note (Signed)
Immunizations UTD Colonoscopy UTD, due in 2021. PSA UTD. Strongly encouraged regular exercise, healthy diet. Exam today unremarkable. Labs reviewed. All recommendations provided at end of visit.  I have personally reviewed and have noted: 1. The patient's medical and social history 2. Their use of alcohol, tobacco or illicit drugs 3. Their current medications and supplements 4. The patient's functional ability including ADL's, fall  risks, home safety risks and hearing or visual  impairment. 5. Diet and physical activities 6. Evidence for depression or mood disorder

## 2019-05-28 ENCOUNTER — Other Ambulatory Visit: Payer: Self-pay | Admitting: Primary Care

## 2019-05-28 DIAGNOSIS — E785 Hyperlipidemia, unspecified: Secondary | ICD-10-CM

## 2019-06-12 DIAGNOSIS — L820 Inflamed seborrheic keratosis: Secondary | ICD-10-CM | POA: Diagnosis not present

## 2019-06-12 DIAGNOSIS — L821 Other seborrheic keratosis: Secondary | ICD-10-CM | POA: Diagnosis not present

## 2019-06-12 DIAGNOSIS — C44311 Basal cell carcinoma of skin of nose: Secondary | ICD-10-CM

## 2019-06-12 DIAGNOSIS — L578 Other skin changes due to chronic exposure to nonionizing radiation: Secondary | ICD-10-CM | POA: Diagnosis not present

## 2019-06-12 DIAGNOSIS — C44319 Basal cell carcinoma of skin of other parts of face: Secondary | ICD-10-CM | POA: Diagnosis not present

## 2019-06-12 HISTORY — DX: Basal cell carcinoma of skin of nose: C44.311

## 2019-07-27 ENCOUNTER — Other Ambulatory Visit: Payer: Self-pay | Admitting: Primary Care

## 2019-07-27 DIAGNOSIS — I10 Essential (primary) hypertension: Secondary | ICD-10-CM

## 2019-08-04 ENCOUNTER — Other Ambulatory Visit: Payer: Self-pay | Admitting: Primary Care

## 2019-08-04 DIAGNOSIS — N183 Chronic kidney disease, stage 3 unspecified: Secondary | ICD-10-CM

## 2019-08-10 DIAGNOSIS — R599 Enlarged lymph nodes, unspecified: Secondary | ICD-10-CM

## 2019-08-12 ENCOUNTER — Other Ambulatory Visit (INDEPENDENT_AMBULATORY_CARE_PROVIDER_SITE_OTHER): Payer: Medicare Other

## 2019-08-12 ENCOUNTER — Other Ambulatory Visit: Payer: Self-pay

## 2019-08-12 DIAGNOSIS — N183 Chronic kidney disease, stage 3 unspecified: Secondary | ICD-10-CM | POA: Diagnosis not present

## 2019-08-12 DIAGNOSIS — R599 Enlarged lymph nodes, unspecified: Secondary | ICD-10-CM | POA: Diagnosis not present

## 2019-08-12 LAB — CBC WITH DIFFERENTIAL/PLATELET
Basophils Absolute: 0 10*3/uL (ref 0.0–0.1)
Basophils Relative: 0.8 % (ref 0.0–3.0)
Eosinophils Absolute: 0.1 10*3/uL (ref 0.0–0.7)
Eosinophils Relative: 1.8 % (ref 0.0–5.0)
HCT: 46.3 % (ref 39.0–52.0)
Hemoglobin: 15.9 g/dL (ref 13.0–17.0)
Lymphocytes Relative: 35.2 % (ref 12.0–46.0)
Lymphs Abs: 2.2 10*3/uL (ref 0.7–4.0)
MCHC: 34.4 g/dL (ref 30.0–36.0)
MCV: 90.4 fl (ref 78.0–100.0)
Monocytes Absolute: 0.7 10*3/uL (ref 0.1–1.0)
Monocytes Relative: 11.2 % (ref 3.0–12.0)
Neutro Abs: 3.1 10*3/uL (ref 1.4–7.7)
Neutrophils Relative %: 51 % (ref 43.0–77.0)
Platelets: 267 10*3/uL (ref 150.0–400.0)
RBC: 5.12 Mil/uL (ref 4.22–5.81)
RDW: 13.2 % (ref 11.5–15.5)
WBC: 6.2 10*3/uL (ref 4.0–10.5)

## 2019-08-12 LAB — BASIC METABOLIC PANEL
BUN: 19 mg/dL (ref 6–23)
CO2: 29 mEq/L (ref 19–32)
Calcium: 9.9 mg/dL (ref 8.4–10.5)
Chloride: 106 mEq/L (ref 96–112)
Creatinine, Ser: 1.45 mg/dL (ref 0.40–1.50)
GFR: 47.26 mL/min — ABNORMAL LOW (ref >60.00)
Glucose, Bld: 116 mg/dL — ABNORMAL HIGH (ref 70–99)
Potassium: 3.9 mEq/L (ref 3.5–5.1)
Sodium: 141 mEq/L (ref 135–145)

## 2019-08-14 ENCOUNTER — Other Ambulatory Visit: Payer: Medicare Other

## 2019-10-05 ENCOUNTER — Ambulatory Visit: Payer: Medicare Other

## 2019-10-13 ENCOUNTER — Other Ambulatory Visit: Payer: Self-pay | Admitting: Primary Care

## 2019-10-13 DIAGNOSIS — E785 Hyperlipidemia, unspecified: Secondary | ICD-10-CM

## 2019-10-14 NOTE — Telephone Encounter (Signed)
Last prescribed on 04/23/2018 . Last appointment on 05/15/2019. No future appointment

## 2019-10-16 NOTE — Telephone Encounter (Signed)
Refill sent to pharmacy.   

## 2019-10-21 ENCOUNTER — Ambulatory Visit: Payer: Medicare Other

## 2019-11-01 ENCOUNTER — Other Ambulatory Visit: Payer: Self-pay | Admitting: Primary Care

## 2019-11-01 DIAGNOSIS — F411 Generalized anxiety disorder: Secondary | ICD-10-CM

## 2019-11-17 ENCOUNTER — Other Ambulatory Visit: Payer: Self-pay

## 2019-11-17 ENCOUNTER — Encounter: Payer: Self-pay | Admitting: Primary Care

## 2019-11-17 ENCOUNTER — Ambulatory Visit (INDEPENDENT_AMBULATORY_CARE_PROVIDER_SITE_OTHER): Payer: Medicare Other | Admitting: Primary Care

## 2019-11-17 VITALS — BP 138/80 | HR 58 | Temp 96.7°F | Ht 74.0 in | Wt 240.0 lb

## 2019-11-17 DIAGNOSIS — R599 Enlarged lymph nodes, unspecified: Secondary | ICD-10-CM

## 2019-11-17 DIAGNOSIS — D119 Benign neoplasm of major salivary gland, unspecified: Secondary | ICD-10-CM | POA: Insufficient documentation

## 2019-11-17 NOTE — Assessment & Plan Note (Signed)
Acute for the last 2 months. Exam today with evidence of likely benign lymph node. Could be secondary to allergy involvement given ear effusion and PND.  Regardless, given patient history of tobacco abuse and duration of lymph node swelling, will obtain ultrasound to rule out anything suspicious.  Soft tissue US pending.

## 2019-11-17 NOTE — Progress Notes (Signed)
Subjective:    Patient ID: Curtis Rankin., male    DOB: 05/11/1941, 79 y.o.   MRN: [id number]  HPI  This visit occurred during the SARS-CoV-2 public health emergency.  Safety protocols were in place, including screening questions prior to the visit, additional usage of staff PPE, and extensive cleaning of exam room while observing appropriate contact time as indicated for disinfecting solutions.   [name] is a 79 year old male with a history of hypertension, CKD, GAD, hyperlipidemia, prediabetes who presents today with a chief complaint of neck swelling.  He initially noticed the swelling 2 months ago, fluctuates during the day. Located just below the pinna of the right ear with intermittent discomfort. Has also noticed "clicking/crackling" to the right ear and mild post nasal drip for about the same time.  He's been using Flonase nasal spray with some improvement with ear symptoms. He denies fevers, masses, unexplained weight loss.   BP Readings from Last 3 Encounters:  11/17/19 138/80  05/15/19 (!) 148/78  10/03/18 (!) 162/76     Review of Systems  Constitutional: Negative for unexpected weight change.  HENT: Positive for postnasal drip. Negative for sore throat.        Ear fullness  Respiratory: Negative for cough.   Hematological: Positive for adenopathy.       Past Medical History:  Diagnosis Date  . Anxiety   . Basal cell carcinoma (BCC) of nostril    Removed, no other intervention needed per pt.  . CKD (chronic kidney disease) stage 3, GFR 30-59 ml/min   . Essential hypertension   . Hyperlipidemia   . Prostate cancer [facility]) 2009     Social History   Socioeconomic History  . Marital status: Married    Spouse name: Not on file  . Number of children: Not on file  . Years of education: Not on file  . Highest education level: Not on file  Occupational History  . Not on file  Tobacco Use  . Smoking status: Former Research scientist (life sciences)  . Smokeless tobacco: Never Used    Substance and Sexual Activity  . Alcohol use: Yes    Alcohol/week: 7.0 standard drinks    Types: 7 Glasses of wine per week  . Drug use: Not Currently  . Sexual activity: Not Currently  Other Topics Concern  . Not on file  Social History Narrative   Married.   No children.   Retired. Once worked for [employer].   Enjoys working on his house, playing golf, riding his bike.    Social Determinants of Health   Financial Resource Strain:   . Difficulty of Paying Living Expenses:   Food Insecurity:   . Worried About Charity fundraiser in the Last Year:   . Arboriculturist in the Last Year:   Transportation Needs:   . Film/video editor (Medical):   Marland Kitchen Lack of Transportation (Non-Medical):   Physical Activity:   . Days of Exercise per Week:   . Minutes of Exercise per Session:   Stress:   . Feeling of Stress :   Social Connections:   . Frequency of Communication with Friends and Family:   . Frequency of Social Gatherings with Friends and Family:   . Attends Religious Services:   . Active Member of Clubs or Organizations:   . Attends Archivist Meetings:   Marland Kitchen Marital Status:   Intimate Partner Violence:   . Fear of Current or Ex-Partner:   .  Emotionally Abused:   Marland Kitchen Physically Abused:   . Sexually Abused:     Past Surgical History:  Procedure Laterality Date  . APPENDECTOMY    . BOWEL RESECTION    . COLON SURGERY    . COLONOSCOPY WITH PROPOFOL N/A 06/05/2017   Procedure: COLONOSCOPY WITH PROPOFOL;  Surgeon: Jonathon Bellows, MD;  Location: [facility] ENDOSCOPY;  Service: Gastroenterology;  Laterality: N/A;  . KNEE ARTHROPLASTY Right   . PROSTATECTOMY    . thyroid biop      Family History  Problem Relation Age of Onset  . Diabetes Sister     No Known Allergies  Current Outpatient Medications on File Prior to Visit  Medication Sig Dispense Refill  . amLODipine (NORVASC) 10 MG tablet Take 1 tablet (10 mg total) by mouth daily. For blood pressure. 90 tablet 3  .  atorvastatin (LIPITOR) 20 MG tablet TAKE 1 TABLET DAILY FOR    CHOLESTEROL 90 tablet 2  . Coenzyme Q10 (COQ10 PO) Take 1 capsule by mouth daily.     . fenofibrate micronized (LOFIBRA) 134 MG capsule TAKE 1 CAPSULE DAILY BEFOREBREAKFAST 90 capsule 1  . GLUCOSAMINE SULFATE PO Take by mouth.    . losartan (COZAAR) 100 MG tablet TAKE 1 TABLET DAILY FOR    BLOOD PRESSURE 90 tablet 1  . Multiple Vitamin (MULTIVITAMIN) tablet Take 1 tablet by mouth daily.    . Omega-3 Fatty Acids (FISH OIL) 1000 MG CAPS Take 1,000 mg by mouth 2 (two) times daily.    . sertraline (ZOLOFT) 50 MG tablet TAKE 1 TABLET BY MOUTH EVERY DAY 90 tablet 1   No current facility-administered medications on file prior to visit.    BP 138/80 (BP Location: Left Arm, Patient Position: Sitting, Cuff Size: Normal)   Pulse (!) 58   Temp (!) 96.7 F (35.9 C) (Temporal)   Wt 218 lb 6.4 oz (99.1 kg)   SpO2 96%   BMI 28.04 kg/m    Objective:   Physical Exam  Constitutional: He appears well-nourished.  Neck:    Immobile, soft, enlarged, mildly tender lymph node to right cervical chain just below right ear pinna. No other enlarged lymph nodes noted.  Cardiovascular: Normal rate and regular rhythm.  Respiratory: Effort normal and breath sounds normal.  Musculoskeletal:     Cervical back: Neck supple.  Skin: Skin is warm and dry.           Assessment & Plan:

## 2019-11-17 NOTE — Patient Instructions (Signed)
You will be contacted regarding your ultrasound.  Please let us know if you have not been contacted within one week.  Continue the Flonase (steroid nasal spray) for the crackling in the ear. You can also add antihistamine (Zyrtec, Allegra, Claritin).  It was a pleasure to see you today!

## 2019-11-20 ENCOUNTER — Other Ambulatory Visit: Payer: Self-pay | Admitting: Primary Care

## 2019-11-20 DIAGNOSIS — I10 Essential (primary) hypertension: Secondary | ICD-10-CM

## 2019-11-25 ENCOUNTER — Ambulatory Visit
Admission: RE | Admit: 2019-11-25 | Discharge: 2019-11-25 | Disposition: A | Discharge status: Home / Self Care | Payer: Medicare Other | Source: Ambulatory Visit | Attending: Primary Care | Admitting: Primary Care

## 2019-11-25 ENCOUNTER — Other Ambulatory Visit: Payer: Self-pay

## 2019-11-25 DIAGNOSIS — R590 Localized enlarged lymph nodes: Secondary | ICD-10-CM | POA: Diagnosis not present

## 2019-11-25 DIAGNOSIS — R599 Enlarged lymph nodes, unspecified: Secondary | ICD-10-CM | POA: Insufficient documentation

## 2019-11-25 DIAGNOSIS — R221 Localized swelling, mass and lump, neck: Secondary | ICD-10-CM | POA: Diagnosis not present

## 2019-11-26 DIAGNOSIS — R599 Enlarged lymph nodes, unspecified: Secondary | ICD-10-CM

## 2019-11-27 ENCOUNTER — Telehealth: Payer: Self-pay

## 2019-11-27 DIAGNOSIS — R599 Enlarged lymph nodes, unspecified: Secondary | ICD-10-CM

## 2019-11-27 NOTE — Telephone Encounter (Signed)
Pt schedule 12-04-19 for CT Neck.  Pt needs lab order for Creatinine.  Pt would like to come to our office to do before CT appt.

## 2019-11-27 NOTE — Addendum Note (Signed)
Addended by: Pleas Koch on: 11/27/2019 10:04 AM   Modules accepted: Orders

## 2019-11-27 NOTE — Telephone Encounter (Signed)
Noted, orders placed. He needs a lab appointment.

## 2019-11-28 NOTE — Telephone Encounter (Signed)
Curtis Black, see messages regarding his weight. I don't believe you were there that day but is there a way to change the weight that was recorded?

## 2019-12-01 NOTE — Telephone Encounter (Signed)
Noted. Weight has been change as instructed.

## 2019-12-01 NOTE — Telephone Encounter (Signed)
Patient is scheduled for labs tomorrow, 4/6.

## 2019-12-02 ENCOUNTER — Other Ambulatory Visit: Payer: Self-pay

## 2019-12-02 ENCOUNTER — Other Ambulatory Visit (INDEPENDENT_AMBULATORY_CARE_PROVIDER_SITE_OTHER): Payer: Medicare Other

## 2019-12-02 DIAGNOSIS — R599 Enlarged lymph nodes, unspecified: Secondary | ICD-10-CM

## 2019-12-02 LAB — CREATININE, SERUM: Creatinine, Ser: 1.45 mg/dL (ref 0.40–1.50)

## 2019-12-04 ENCOUNTER — Other Ambulatory Visit: Payer: Self-pay | Admitting: Primary Care

## 2019-12-04 ENCOUNTER — Ambulatory Visit
Admission: RE | Admit: 2019-12-04 | Discharge: 2019-12-04 | Disposition: A | Discharge status: Home / Self Care | Payer: Medicare Other | Source: Ambulatory Visit | Attending: Primary Care | Admitting: Primary Care

## 2019-12-04 ENCOUNTER — Other Ambulatory Visit: Payer: Self-pay

## 2019-12-04 ENCOUNTER — Telehealth: Payer: Self-pay

## 2019-12-04 DIAGNOSIS — R599 Enlarged lymph nodes, unspecified: Secondary | ICD-10-CM | POA: Insufficient documentation

## 2019-12-04 MED ORDER — IOHEXOL 300 MG/ML  SOLN
75.0000 mL | Freq: Once | INTRAMUSCULAR | Status: AC | PRN
  Administered 2019-12-04: 65 mL via INTRAVENOUS

## 2019-12-04 NOTE — Telephone Encounter (Signed)
CT Call Report: [facility] Mass posterior right parotid. Report in system

## 2019-12-04 NOTE — Telephone Encounter (Signed)
Report reviewed. Awaiting patient response via my chart.

## 2019-12-05 ENCOUNTER — Other Ambulatory Visit: Payer: Self-pay | Admitting: Primary Care

## 2019-12-05 DIAGNOSIS — R599 Enlarged lymph nodes, unspecified: Secondary | ICD-10-CM

## 2019-12-11 ENCOUNTER — Encounter: Payer: Self-pay | Admitting: Dermatology

## 2019-12-11 ENCOUNTER — Other Ambulatory Visit: Payer: Self-pay

## 2019-12-11 ENCOUNTER — Ambulatory Visit (INDEPENDENT_AMBULATORY_CARE_PROVIDER_SITE_OTHER): Payer: Medicare Other | Admitting: Dermatology

## 2019-12-11 DIAGNOSIS — L814 Other melanin hyperpigmentation: Secondary | ICD-10-CM | POA: Diagnosis not present

## 2019-12-11 DIAGNOSIS — L821 Other seborrheic keratosis: Secondary | ICD-10-CM | POA: Diagnosis not present

## 2019-12-11 DIAGNOSIS — D1800 Hemangioma unspecified site: Secondary | ICD-10-CM | POA: Diagnosis not present

## 2019-12-11 DIAGNOSIS — L578 Other skin changes due to chronic exposure to nonionizing radiation: Secondary | ICD-10-CM | POA: Diagnosis not present

## 2019-12-11 DIAGNOSIS — Z85828 Personal history of other malignant neoplasm of skin: Secondary | ICD-10-CM

## 2019-12-11 DIAGNOSIS — L720 Epidermal cyst: Secondary | ICD-10-CM

## 2019-12-11 DIAGNOSIS — Z1283 Encounter for screening for malignant neoplasm of skin: Secondary | ICD-10-CM | POA: Diagnosis not present

## 2019-12-11 DIAGNOSIS — D229 Melanocytic nevi, unspecified: Secondary | ICD-10-CM

## 2019-12-11 DIAGNOSIS — L820 Inflamed seborrheic keratosis: Secondary | ICD-10-CM | POA: Diagnosis not present

## 2019-12-11 MED ORDER — DOXYCYCLINE HYCLATE 100 MG PO CAPS: 100.0000 mg | ORAL_CAPSULE | Freq: Every day | ORAL | 0 refills | Status: AC

## 2019-12-11 NOTE — Patient Instructions (Addendum)

## 2019-12-11 NOTE — Progress Notes (Signed)
   Follow-Up Visit   Subjective  Curtis Black is a 79 y.o. male who presents for the following: Annual Exam (History of BCC).  Patient presents for skin cancer screening and mole check and total-body skin exam  The following portions of the chart were reviewed this encounter and updated as appropriate: Tobacco  Allergies  Meds  Problems  Med Hx  Surg Hx  Fam Hx      Review of Systems: No other skin or systemic complaints.  Objective  Well appearing patient in no apparent distress; mood and affect are within normal limits.  A full examination was performed including scalp, head, eyes, ears, nose, lips, neck, chest, axillae, abdomen, back, buttocks, bilateral upper extremities, bilateral lower extremities, hands, feet, fingers, toes, fingernails, and toenails. All findings within normal limits unless otherwise noted below.  Objective  Left Inferior Helix, Mid Back, Right Abdomen (side) - Lower: 2.5 cm cystic papule of right abdomen 1.5 cm cystic papule of post neck Cystic papule with erythema of back  Objective  Mid Back, Right Hand - Posterior: Erythematous keratotic or waxy stuck-on papule or plaque.   Assessment & Plan    Skin cancer screening performed today.  Actinic Damage - diffuse scaly erythematous macules with underlying dyspigmentation - Recommend daily broad spectrum sunscreen SPF 30+ to sun-exposed areas, reapply every 2 hours as needed.  - Call for new or changing lesions.  Seborrheic Keratoses - Stuck-on, waxy, tan-brown papules and plaques  - Discussed benign etiology and prognosis. - Observe - Call for any changes  Lentigines - Scattered tan macules - Discussed due to sun exposure - Benign, observe - Call for any changes  Hemangiomas - Red papules - Discussed benign nature - Observe - Call for any changes  Melanocytic Nevi - Tan-brown and/or pink-flesh-colored symmetric macules and papules - Benign appearing on exam today - Observation - Call  clinic for new or changing moles - Recommend daily use of broad spectrum spf 30+ sunscreen to sun-exposed areas.    Epidermal inclusion cyst (3) Right Abdomen (side) -; Mid Back; Left Inferior Helix  The cyst of the mid back is acutely inflamed.  We discussed treatment options with I&D.  The patient declines I&D today.  We will treat with oral antibiotics and plan excision in the near future.  Cysts of right abdomen and post neck Inflamed cyst of back   Will plan excisions in the future (one at a time). Will plan to excise the cyst of back on follow up.  doxycycline (VIBRAMYCIN) 100 MG capsule - Mid Back  Inflamed seborrheic keratosis (2) Right Hand - Posterior; Mid Back  Destruction of lesion - Mid Back, Right Hand - Posterior Complexity: simple   Destruction method: cryotherapy   Informed consent: discussed and consent obtained   Timeout:  patient name, date of birth, surgical site, and procedure verified Lesion destroyed using liquid nitrogen: Yes   Region frozen until ice ball extended beyond lesion: Yes   Outcome: patient tolerated procedure well with no complications   Post-procedure details: wound care instructions given    Return in about 6 weeks (around 01/22/2020) for surgery - cyst on back, then 7 weeks for cyst on abdomen then 8 weeks for cyst of post neck.   I, Ashok Cordia, CMA, am acting as scribe for Sarina Ser, MD . Documentation: I have reviewed the above documentation for accuracy and completeness, and I agree with the above.  Sarina Ser, MD

## 2019-12-11 NOTE — Progress Notes (Signed)
Patient on schedule for Parotid biopsy on 12/16/2019, spoke with "Curtis Black" made aware to be NPO after MN, be here @ 0930, and wife to drive home after recovery/biopsy, stated understanding.

## 2019-12-15 ENCOUNTER — Other Ambulatory Visit: Payer: Self-pay | Admitting: Physician Assistant

## 2019-12-15 ENCOUNTER — Other Ambulatory Visit: Payer: Self-pay | Admitting: Student

## 2019-12-16 ENCOUNTER — Ambulatory Visit
Admission: RE | Admit: 2019-12-16 | Discharge: 2019-12-16 | Disposition: A | Discharge status: Home / Self Care | Payer: Medicare Other | Source: Ambulatory Visit | Attending: Primary Care | Admitting: Primary Care

## 2019-12-16 ENCOUNTER — Other Ambulatory Visit: Payer: Self-pay

## 2019-12-16 DIAGNOSIS — R599 Enlarged lymph nodes, unspecified: Secondary | ICD-10-CM

## 2019-12-16 DIAGNOSIS — K118 Other diseases of salivary glands: Secondary | ICD-10-CM | POA: Diagnosis not present

## 2019-12-16 DIAGNOSIS — D110 Benign neoplasm of parotid gland: Secondary | ICD-10-CM | POA: Insufficient documentation

## 2019-12-16 MED ORDER — DIPHENHYDRAMINE HCL 50 MG/ML IJ SOLN
INTRAMUSCULAR | Status: AC
  Filled 2019-12-16: qty 1

## 2019-12-16 MED ORDER — DIPHENHYDRAMINE HCL 25 MG PO CAPS
ORAL_CAPSULE | ORAL | Status: AC
  Filled 2019-12-16: qty 2

## 2019-12-16 MED ORDER — SODIUM CHLORIDE 0.9 % IV SOLN: INTRAVENOUS | Status: DC

## 2019-12-16 NOTE — OR Nursing (Signed)
Pt declined sedation. discharge instructions reviewed pre biopsy. Ice pack sent with pt to ultrasound

## 2019-12-16 NOTE — Procedures (Signed)
Pre Procedure Dx: Right submandibular gland nodule Post Procedural Dx: Same  Technically successful US guided FNA and core biopsy of indeterminate right submandibular gland nodule.  EBL: None  Patient experienced what was felt to be a mild vaso-vagal reaction, however this resolved without dedicated intervention.   Ronny Bacon, MD Pager #: [phone]

## 2019-12-16 NOTE — Discharge Instructions (Signed)
Needle Biopsy, Care After This sheet gives you information about how to care for yourself after your procedure. Your health care provider may also give you more specific instructions. If you have problems or questions, contact your health care provider. What can I expect after the procedure? After the procedure, it is common to have soreness, bruising, or mild pain at the puncture site. This should go away in a few days. Follow these instructions at home: Needle insertion site care   Wash your hands with soap and water before you change your bandage (dressing). If you cannot use soap and water, use hand sanitizer.  Follow instructions from your health care provider about how to take care of your puncture site. This includes: ? When and how to change your dressing. ? When to remove your dressing.  Check your puncture site every day for signs of infection. Check for: ? Redness, swelling, or pain. ? Fluid or blood. ? Pus or a bad smell. ? Warmth. General instructions  Return to your normal activities as told by your health care provider. Ask your health care provider what activities are safe for you.  Do not take baths, swim, or use a hot tub until your health care provider approves. Ask your health care provider if you may take showers. You may only be allowed to take sponge baths.  Take over-the-counter and prescription medicines only as told by your health care provider.  Keep all follow-up visits as told by your health care provider. This is important. Contact a health care provider if:  You have a fever.  You have redness, swelling, or pain at the puncture site that lasts longer than a few days.  You have fluid, blood, or pus coming from your puncture site.  Your puncture site feels warm to the touch. Get help right away if:  You have severe bleeding from the puncture site. Summary  After the procedure, it is common to have soreness, bruising, or mild pain at the puncture  site. This should go away in a few days.  Check your puncture site every day for signs of infection, such as redness, swelling, or pain.  Get help right away if you have severe bleeding from your puncture site. This information is not intended to replace advice given to you by your health care provider. Make sure you discuss any questions you have with your health care provider. Document Revised: 10/26/2017 Document Reviewed: 08/27/2017 Elsevier Patient Education  2020 [employer]  

## 2019-12-17 LAB — SURGICAL PATHOLOGY

## 2019-12-17 LAB — CYTOLOGY - NON PAP

## 2019-12-17 NOTE — Telephone Encounter (Signed)
FYI

## 2019-12-20 ENCOUNTER — Other Ambulatory Visit: Payer: Self-pay | Admitting: Primary Care

## 2019-12-20 DIAGNOSIS — I10 Essential (primary) hypertension: Secondary | ICD-10-CM

## 2020-01-20 ENCOUNTER — Telehealth: Payer: Self-pay

## 2020-01-20 ENCOUNTER — Ambulatory Visit (INDEPENDENT_AMBULATORY_CARE_PROVIDER_SITE_OTHER): Payer: Medicare Other | Admitting: Dermatology

## 2020-01-20 ENCOUNTER — Other Ambulatory Visit: Payer: Self-pay

## 2020-01-20 DIAGNOSIS — L720 Epidermal cyst: Secondary | ICD-10-CM

## 2020-01-20 MED ORDER — DOXYCYCLINE HYCLATE 100 MG PO CAPS: 100.0000 mg | ORAL_CAPSULE | Freq: Two times a day (BID) | ORAL | 0 refills | Status: AC

## 2020-01-20 MED ORDER — MUPIROCIN 2 % EX OINT: 1.0000 "application " | TOPICAL_OINTMENT | Freq: Every day | CUTANEOUS | 0 refills | Status: DC

## 2020-01-20 NOTE — Progress Notes (Signed)
   Follow-Up Visit   Subjective  Curtis Black is a 79 y.o. male who presents for the following: Cyst (mid back - excise today).    The following portions of the chart were reviewed this encounter and updated as appropriate:  Tobacco  Allergies  Meds  Problems  Med Hx  Surg Hx  Fam Hx      Review of Systems:  No other skin or systemic complaints except as noted in HPI or Assessment and Plan.  Objective  Well appearing patient in no apparent distress; mood and affect are within normal limits.  All skin waist up examined.  Objective  Mid Back, Neck - Posterior, Right Abdomen (side) - Upper: 2.6 x 2.0 cm subcutaneous nodule of right abdomen. Subcutaneous nodule of post neck. Subcutaneous nodule with erythema of mid back.   Assessment & Plan  Epidermal inclusion cyst (3) Neck - Posterior; Right Abdomen (side) - Upper; Mid Back  Will plan excision of cyst of mid back and post neck on follow up appointments (02/03/20 and 02/10/20)  doxycycline (VIBRAMYCIN) 100 MG capsule - Mid Back  mupirocin ointment (BACTROBAN) 2 % - Right Abdomen (side) - Upper  Skin excision - Right Abdomen (side) - Upper  Lesion length (cm):  2.6 Lesion width (cm):  2 Margin per side (cm):  0.2 Total excision diameter (cm):  3 Informed consent: discussed and consent obtained   Timeout: patient name, date of birth, surgical site, and procedure verified   Procedure prep:  Patient was prepped and draped in usual sterile fashion Prep type:  Isopropyl alcohol and povidone-iodine Anesthesia: the lesion was anesthetized in a standard fashion   Anesthetic:  1% lidocaine w/ epinephrine 1-100,000 buffered w/ 8.4% NaHCO3 Instrument used: #15 blade   Hemostasis achieved with: pressure   Hemostasis achieved with comment:  Electrocautery Outcome: patient tolerated procedure well with no complications   Post-procedure details: sterile dressing applied and wound care instructions given   Dressing type: bandage and  pressure dressing (mupirocin)    Skin repair - Right Abdomen (side) - Upper Complexity:  Complex Final length (cm):  3 Reason for type of repair: reduce tension to allow closure, reduce the risk of dehiscence, infection, and necrosis, reduce subcutaneous dead space and avoid a hematoma, allow closure of the large defect, preserve normal anatomy, preserve normal anatomical and functional relationships and enhance both functionality and cosmetic results   Undermining: area extensively undermined   Undermining comment:  Undermining defect 2.5 cm Subcutaneous layers (deep stitches):  Suture size:  2-0 Suture type: Vicryl (polyglactin 910)   Subcutaneous suture technique: inverted dermal. Fine/surface layer approximation (top stitches):  Suture size:  3-0 Suture type comment:  Nylon Stitches: simple running   Suture removal (days):  7 Hemostasis achieved with: suture and pressure Outcome: patient tolerated procedure well with no complications   Post-procedure details: sterile dressing applied and wound care instructions given   Dressing type: bandage and pressure dressing (mupirocin)    Specimen 1 - Surgical pathology Differential Diagnosis: Cyst vs other  Check Margins: No 2.6 x 2.0 cm subcutaneous nodule of right abdomen.  Subcutaneous nodule with erythema of mid back.  Return in about 1 week (around 01/27/2020).   I, Ashok Cordia, CMA, am acting as scribe for Sarina Ser, MD .  Documentation: I have reviewed the above documentation for accuracy and completeness, and I agree with the above.  Sarina Ser, MD

## 2020-01-20 NOTE — Telephone Encounter (Signed)
Pt doing fine after today's' surgery.  Advised pt to call the office if any problems, otherwise we would see him next week for his post-op appointment./sh

## 2020-01-20 NOTE — Patient Instructions (Signed)

## 2020-01-21 ENCOUNTER — Encounter: Payer: Self-pay | Admitting: Dermatology

## 2020-01-27 ENCOUNTER — Ambulatory Visit (INDEPENDENT_AMBULATORY_CARE_PROVIDER_SITE_OTHER): Payer: Medicare Other

## 2020-01-27 ENCOUNTER — Other Ambulatory Visit: Payer: Self-pay

## 2020-01-27 DIAGNOSIS — Z4802 Encounter for removal of sutures: Secondary | ICD-10-CM

## 2020-01-27 DIAGNOSIS — L720 Epidermal cyst: Secondary | ICD-10-CM

## 2020-01-27 NOTE — Progress Notes (Addendum)
   Follow-Up Visit   Subjective  Curtis Black is a 79 y.o. male who presents for the following: post op/suture removal (R abdomen - pathology proven cyst).  The following portions of the chart were reviewed this encounter and updated as appropriate:     Review of Systems:  No other skin or systemic complaints except as noted in HPI or Assessment and Plan.  Objective  Well appearing patient in no apparent distress; mood and affect are within normal limits.  A focused examination was performed including the abdomen. Relevant physical exam findings are noted in the Assessment and Plan.  Objective  Right Abdomen (side) - Upper: Healing excision site   Assessment & Plan  Epidermal inclusion cyst Right Abdomen (side) - Upper  Encounter for Removal of Sutures - Incision site at the R abdomen is clean, dry and intact - Wound cleansed, sutures removed, wound cleansed and steri strips applied.  - Discussed pathology results showing a benign cyst - Patient advised to keep steri-strips dry until they fall off. - Scars remodel for a full year. - Once steri-strips fall off, patient can apply over-the-counter silicone scar cream each night to help with scar remodeling if desired. - Patient advised to call with any concerns or if they notice any new or changing lesions.   Other Related Medications doxycycline (VIBRAMYCIN) 100 MG capsule mupirocin ointment (BACTROBAN) 2 %  Return for appointment as scheduled.   Luther Redo, CMA, am acting as scribe for Rosaria Ferries, CMA .  Documentation: I have reviewed the above documentation for accuracy and completeness, and I agree with the above.  Brendolyn Patty MD

## 2020-02-03 ENCOUNTER — Ambulatory Visit (INDEPENDENT_AMBULATORY_CARE_PROVIDER_SITE_OTHER): Payer: Medicare Other | Admitting: Dermatology

## 2020-02-03 ENCOUNTER — Other Ambulatory Visit: Payer: Self-pay

## 2020-02-03 ENCOUNTER — Telehealth: Payer: Self-pay

## 2020-02-03 ENCOUNTER — Encounter: Payer: Self-pay | Admitting: Dermatology

## 2020-02-03 DIAGNOSIS — L720 Epidermal cyst: Secondary | ICD-10-CM | POA: Diagnosis not present

## 2020-02-03 DIAGNOSIS — D485 Neoplasm of uncertain behavior of skin: Secondary | ICD-10-CM

## 2020-02-03 DIAGNOSIS — D492 Neoplasm of unspecified behavior of bone, soft tissue, and skin: Secondary | ICD-10-CM

## 2020-02-03 NOTE — Addendum Note (Signed)
Addended by: Brendolyn Patty on: 02/03/2020 06:12 PM   Modules accepted: Level of Service

## 2020-02-03 NOTE — Progress Notes (Signed)
   Follow-Up Visit   Subjective  Curtis Black is a 79 y.o. male who presents for the following: Cyst (mid back, pt presents for excision today).   The following portions of the chart were reviewed this encounter and updated as appropriate:  Tobacco  Allergies  Meds  Problems  Med Hx  Surg Hx  Fam Hx      Review of Systems:  No other skin or systemic complaints except as noted in HPI or Assessment and Plan.  Objective  Well appearing patient in no apparent distress; mood and affect are within normal limits.  A focused examination was performed including back, neck. Relevant physical exam findings are noted in the Assessment and Plan.  Objective  Posterior neck, L mid back: Cystic paps  Objective  Mid Back: Cystic pap 2.1 x 1.0cm   Assessment & Plan  Epidermal cyst of mid back spinal area with recent past history of inflammation and rupture and drainage. Posterior neck, L mid back  Pt scheduled for excision of L mid back  Neoplasm of skin Mid Back  Skin excision  Lesion length (cm):  2.1 Lesion width (cm):  1 Margin per side (cm):  0.2 Total excision diameter (cm):  2.5 Informed consent: discussed and consent obtained   Timeout: patient name, date of birth, surgical site, and procedure verified   Procedure prep:  Patient was prepped and draped in usual sterile fashion Prep type:  Isopropyl alcohol and povidone-iodine Anesthesia: the lesion was anesthetized in a standard fashion   Anesthetic:  0.5% bupivicaine w/ epinephrine 1-100,000 local infiltration (9cc) Instrument used: #15 blade   Hemostasis achieved with: pressure   Hemostasis achieved with comment:  Electrocautery Outcome: patient tolerated procedure well with no complications   Post-procedure details: sterile dressing applied and wound care instructions given   Dressing type: bandage and pressure dressing (Mupirocin)    Skin repair Complexity:  Complex Final length (cm):  3.5 Reason for type of repair:  reduce tension to allow closure, reduce the risk of dehiscence, infection, and necrosis, reduce subcutaneous dead space and avoid a hematoma, allow closure of the large defect, preserve normal anatomy, preserve normal anatomical and functional relationships and enhance both functionality and cosmetic results   Undermining: area extensively undermined   Undermining comment:  Undermining Defect 1.5cm Subcutaneous layers (deep stitches):  Suture size:  2-0 Suture type: Vicryl (polyglactin 910)   Subcutaneous suture technique: Inverted Dermal. Fine/surface layer approximation (top stitches):  Suture size:  2-0 Stitches: horizontal mattress   Stitches comment:  Nylon Suture removal (days):  7 Hemostasis achieved with: suture and pressure Outcome: patient tolerated procedure well with no complications   Post-procedure details: sterile dressing applied and wound care instructions given   Dressing type: bandage and pressure dressing (Mupirocin)   Additional details:  [name] 200mg  Take 1/2 tablet bid with food and plenty of fluid x 1 week - samples given (Lot # [id number] Exp 12/2020)  Specimen 1 - Surgical pathology Differential Diagnosis: D48.5 Cyst vs other Check Margins: No Cystic pap 2.1 x 1.0cm  Return for as scheduled for exc of cyst of L mid back.  I, Curtis Black, RMA, am acting as scribe for Sarina Ser, MD .  Documentation: I have reviewed the above documentation for accuracy and completeness, and I agree with the above.  Sarina Ser, MD

## 2020-02-03 NOTE — Telephone Encounter (Signed)
Pt doing fine after today's surgery./sh 

## 2020-02-03 NOTE — Patient Instructions (Signed)

## 2020-02-10 ENCOUNTER — Telehealth: Payer: Self-pay

## 2020-02-10 ENCOUNTER — Ambulatory Visit (INDEPENDENT_AMBULATORY_CARE_PROVIDER_SITE_OTHER): Payer: Medicare Other | Admitting: Dermatology

## 2020-02-10 ENCOUNTER — Other Ambulatory Visit: Payer: Self-pay

## 2020-02-10 DIAGNOSIS — D485 Neoplasm of uncertain behavior of skin: Secondary | ICD-10-CM | POA: Diagnosis not present

## 2020-02-10 DIAGNOSIS — L720 Epidermal cyst: Secondary | ICD-10-CM

## 2020-02-10 NOTE — Patient Instructions (Signed)

## 2020-02-10 NOTE — Telephone Encounter (Signed)
Spoke with patient regarding surgery. He is doing fine.

## 2020-02-10 NOTE — Progress Notes (Signed)
   Follow-Up Visit   Subjective  Curtis Black is a 79 y.o. male who presents for the following: Cyst (post neck - Excise today.) and Other (Post op - cyst of mid back). The Cyst is growing and symptomatic.  The following portions of the chart were reviewed this encounter and updated as appropriate:  Tobacco  Allergies  Meds  Problems  Med Hx  Surg Hx  Fam Hx      Review of Systems:  No other skin or systemic complaints except as noted in HPI or Assessment and Plan.  Objective  Well appearing patient in no apparent distress; mood and affect are within normal limits.  A focused examination was performed including neck, back. Relevant physical exam findings are noted in the Assessment and Plan.  Objective  Left post neck: 1.6 cm cystic papule  Objective  Mid Back: Healing excision site   Assessment & Plan    Neoplasm of uncertain behavior of skin Left post neck  Skin excision  Lesion length (cm):  1.6 Lesion width (cm):  1.6 Margin per side (cm):  0.3 Total excision diameter (cm):  2.2 Informed consent: discussed and consent obtained   Timeout: patient name, date of birth, surgical site, and procedure verified   Procedure prep:  Patient was prepped and draped in usual sterile fashion Prep type:  Isopropyl alcohol and povidone-iodine Anesthesia: the lesion was anesthetized in a standard fashion   Anesthetic:  0.5% bupivicaine w/ epinephrine 1-100,000 local infiltration Instrument used: #15 blade   Hemostasis achieved with: pressure   Hemostasis achieved with comment:  Electrocautery Outcome: patient tolerated procedure well with no complications   Post-procedure details: sterile dressing applied and wound care instructions given   Dressing type: bandage and pressure dressing (mupirocin)    Skin repair Complexity:  Complex Final length (cm):  3.5 Reason for type of repair: reduce tension to allow closure, reduce the risk of dehiscence, infection, and necrosis, reduce  subcutaneous dead space and avoid a hematoma, allow closure of the large defect, preserve normal anatomy, preserve normal anatomical and functional relationships and enhance both functionality and cosmetic results   Undermining: area extensively undermined   Undermining comment:  Undermining defect 2.2 cm Subcutaneous layers (deep stitches):  Suture size:  3-0 Suture type: Vicryl (polyglactin 910)   Subcutaneous suture technique: inverted dermal. Fine/surface layer approximation (top stitches):  Suture type comment:  Nylon Stitches: simple running   Suture removal (days):  7 Hemostasis achieved with: suture and pressure Outcome: patient tolerated procedure well with no complications   Post-procedure details: sterile dressing applied and wound care instructions given   Dressing type: bandage and pressure dressing (mupirocin)   Additional details:  Mupirocin oint qd with dressing changes - patient has.  Specimen 1 - Surgical pathology Differential Diagnosis: Cyst vs other  Check Margins: No 1.6 cm cystic papule  Epidermal inclusion cyst Mid Back  Cyst - healing well. Wound cleansed today, sutures removed, steristrips applied.  Return in about 1 week (around 02/17/2020) for suture removal.  I, Ashok Cordia, CMA, am acting as scribe for Sarina Ser, MD . Documentation: I have reviewed the above documentation for accuracy and completeness, and I agree with the above.  Sarina Ser, MD

## 2020-02-14 ENCOUNTER — Encounter: Payer: Self-pay | Admitting: Dermatology

## 2020-02-17 ENCOUNTER — Ambulatory Visit (INDEPENDENT_AMBULATORY_CARE_PROVIDER_SITE_OTHER): Payer: Medicare Other | Admitting: Dermatology

## 2020-02-17 ENCOUNTER — Other Ambulatory Visit: Payer: Self-pay

## 2020-02-17 DIAGNOSIS — Z4802 Encounter for removal of sutures: Secondary | ICD-10-CM

## 2020-02-17 DIAGNOSIS — L720 Epidermal cyst: Secondary | ICD-10-CM

## 2020-02-17 NOTE — Progress Notes (Signed)
   Follow-Up Visit   Subjective  Curtis Black is a 79 y.o. male who presents for the following: post op/suture removal (pathology proven cyst on the posterior neck - no complaints per patient).  The following portions of the chart were reviewed this encounter and updated as appropriate:  Tobacco  Allergies  Meds  Problems  Med Hx  Surg Hx  Fam Hx     Review of Systems:  No other skin or systemic complaints except as noted in HPI or Assessment and Plan.  Objective  Well appearing patient in no apparent distress; mood and affect are within normal limits.  A focused examination was performed including the posterior neck. Relevant physical exam findings are noted in the Assessment and Plan.  Objective  Neck - Posterior: Healing excision site   Assessment & Plan    Epidermal inclusion cyst Neck - Posterior  Encounter for Removal of Sutures - Incision site at the posterior neck is clean, dry and intact - Wound cleansed, sutures removed, wound cleansed and steri strips applied.  - Discussed pathology results showing benign cyst  - Patient advised to keep steri-strips dry until they fall off. - Scars remodel for a full year. - Once steri-strips fall off, patient can apply over-the-counter silicone scar cream each night to help with scar remodeling if desired. - Patient advised to call with any concerns or if they notice any new or changing lesions.   Return in about 6 months (around 08/18/2020) for TBSE - hx skin CA.  Luther Redo, CMA, am acting as scribe for Sarina Ser, MD .  Documentation: I have reviewed the above documentation for accuracy and completeness, and I agree with the above.  Sarina Ser, MD

## 2020-02-19 ENCOUNTER — Encounter: Payer: Self-pay | Admitting: Dermatology

## 2020-03-09 ENCOUNTER — Other Ambulatory Visit: Payer: Self-pay | Admitting: Primary Care

## 2020-03-09 DIAGNOSIS — E785 Hyperlipidemia, unspecified: Secondary | ICD-10-CM

## 2020-03-17 ENCOUNTER — Ambulatory Visit: Payer: Medicare Other | Admitting: Gastroenterology

## 2020-04-27 ENCOUNTER — Telehealth: Payer: Self-pay | Admitting: Primary Care

## 2020-04-27 NOTE — Telephone Encounter (Signed)
Called pt on 04/27/20 and LVM to RTN my call to R/S his appt on 05/20/20.

## 2020-04-28 ENCOUNTER — Other Ambulatory Visit: Payer: Self-pay | Admitting: Primary Care

## 2020-04-28 DIAGNOSIS — F411 Generalized anxiety disorder: Secondary | ICD-10-CM

## 2020-05-06 DIAGNOSIS — H2513 Age-related nuclear cataract, bilateral: Secondary | ICD-10-CM | POA: Diagnosis not present

## 2020-05-10 ENCOUNTER — Telehealth (INDEPENDENT_AMBULATORY_CARE_PROVIDER_SITE_OTHER): Payer: Self-pay | Admitting: Gastroenterology

## 2020-05-10 ENCOUNTER — Other Ambulatory Visit: Payer: Self-pay

## 2020-05-10 DIAGNOSIS — Z86010 Personal history of colonic polyps: Secondary | ICD-10-CM

## 2020-05-10 MED ORDER — GOLYTELY 236 G PO SOLR: 4000.0000 mL | Freq: Once | ORAL | 0 refills | Status: AC

## 2020-05-10 NOTE — Progress Notes (Signed)
Gastroenterology Pre-Procedure Review  Request Date: Tuesday 06/15/20 Requesting Physician: Dr. Vicente Males  PATIENT REVIEW QUESTIONS: The patient responded to the following health history questions as indicated:    1. Are you having any GI issues? Blood in stool about a month ago but went away.  Pt said he figured it to be hemorrhoids. 2. Do you have a personal history of Polyps? yes (10//09/18 colonoscopy performed by Dr. Vicente Males) 3. Do you have a family history of Colon Cancer or Polyps? no 4. Diabetes Mellitus? no 5. Joint replacements in the past 12 months?no 6. Major health problems in the past 3 months?no 7. Any artificial heart valves, MVP, or defibrillator?no    MEDICATIONS & ALLERGIES:    Patient reports the following regarding taking any anticoagulation/antiplatelet therapy:   Plavix, Coumadin, Eliquis, Xarelto, Lovenox, Pradaxa, Brilinta, or Effient? no Aspirin? no  Patient confirms/reports the following medications:  Current Outpatient Medications  Medication Sig Dispense Refill  . amLODipine (NORVASC) 10 MG tablet TAKE 1 TABLET BY MOUTH EVERY DAY FOR BLOOD PRESSURE 90 tablet 1  . atorvastatin (LIPITOR) 20 MG tablet TAKE 1 TABLET DAILY FOR    CHOLESTEROL 90 tablet 2  . Coenzyme Q10 (COQ10 PO) Take 1 capsule by mouth daily.     . fenofibrate micronized (LOFIBRA) 134 MG capsule TAKE 1 CAPSULE DAILY BEFORE BREAKFAST will be due for appointment in Sept 90 capsule 0  . GLUCOSAMINE SULFATE PO Take by mouth.    . losartan (COZAAR) 100 MG tablet TAKE 1 TABLET DAILY FOR    BLOOD PRESSURE 90 tablet 1  . Multiple Vitamin (MULTIVITAMIN) tablet Take 1 tablet by mouth daily.    . Omega-3 Fatty Acids (FISH OIL) 1000 MG CAPS Take 1,000 mg by mouth 2 (two) times daily.    . sertraline (ZOLOFT) 50 MG tablet TAKE 1 TABLET BY MOUTH EVERY DAY 90 tablet 0   No current facility-administered medications for this visit.    Patient confirms/reports the following allergies:  Allergies  Allergen  Reactions  . Lidocaine     No orders of the defined types were placed in this encounter.   AUTHORIZATION INFORMATION Primary Insurance: 1D#: Group #:  Secondary Insurance: 1D#: Group #:  SCHEDULE INFORMATION: Date: Tuesday 06/15/20 Time: Location:[facility]

## 2020-05-15 ENCOUNTER — Other Ambulatory Visit: Payer: Self-pay | Admitting: Primary Care

## 2020-05-15 DIAGNOSIS — I10 Essential (primary) hypertension: Secondary | ICD-10-CM

## 2020-05-17 ENCOUNTER — Other Ambulatory Visit: Payer: Self-pay | Admitting: Primary Care

## 2020-05-17 DIAGNOSIS — I10 Essential (primary) hypertension: Secondary | ICD-10-CM

## 2020-05-19 ENCOUNTER — Other Ambulatory Visit: Payer: Self-pay | Admitting: Primary Care

## 2020-05-19 DIAGNOSIS — Z125 Encounter for screening for malignant neoplasm of prostate: Secondary | ICD-10-CM

## 2020-05-19 DIAGNOSIS — E785 Hyperlipidemia, unspecified: Secondary | ICD-10-CM

## 2020-05-19 DIAGNOSIS — Z23 Encounter for immunization: Secondary | ICD-10-CM | POA: Diagnosis not present

## 2020-05-19 DIAGNOSIS — I10 Essential (primary) hypertension: Secondary | ICD-10-CM

## 2020-05-19 DIAGNOSIS — Z1159 Encounter for screening for other viral diseases: Secondary | ICD-10-CM

## 2020-05-19 DIAGNOSIS — R7303 Prediabetes: Secondary | ICD-10-CM

## 2020-05-19 DIAGNOSIS — N183 Chronic kidney disease, stage 3 unspecified: Secondary | ICD-10-CM

## 2020-05-20 ENCOUNTER — Ambulatory Visit: Payer: Medicare Other

## 2020-05-21 ENCOUNTER — Other Ambulatory Visit: Payer: Self-pay | Admitting: Primary Care

## 2020-05-21 DIAGNOSIS — E785 Hyperlipidemia, unspecified: Secondary | ICD-10-CM

## 2020-05-25 ENCOUNTER — Other Ambulatory Visit (INDEPENDENT_AMBULATORY_CARE_PROVIDER_SITE_OTHER): Payer: Medicare Other

## 2020-05-25 ENCOUNTER — Ambulatory Visit (INDEPENDENT_AMBULATORY_CARE_PROVIDER_SITE_OTHER): Payer: Medicare Other

## 2020-05-25 ENCOUNTER — Other Ambulatory Visit: Payer: Self-pay

## 2020-05-25 DIAGNOSIS — E785 Hyperlipidemia, unspecified: Secondary | ICD-10-CM | POA: Diagnosis not present

## 2020-05-25 DIAGNOSIS — Z125 Encounter for screening for malignant neoplasm of prostate: Secondary | ICD-10-CM

## 2020-05-25 DIAGNOSIS — I10 Essential (primary) hypertension: Secondary | ICD-10-CM | POA: Diagnosis not present

## 2020-05-25 DIAGNOSIS — R7303 Prediabetes: Secondary | ICD-10-CM | POA: Diagnosis not present

## 2020-05-25 DIAGNOSIS — Z1159 Encounter for screening for other viral diseases: Secondary | ICD-10-CM

## 2020-05-25 DIAGNOSIS — Z0000 Encounter for general adult medical examination without abnormal findings: Secondary | ICD-10-CM | POA: Diagnosis not present

## 2020-05-25 LAB — COMPREHENSIVE METABOLIC PANEL
ALT: 25 U/L (ref 0–53)
AST: 29 U/L (ref 0–37)
Albumin: 4.7 g/dL (ref 3.5–5.2)
Alkaline Phosphatase: 37 U/L — ABNORMAL LOW (ref 39–117)
BUN: 27 mg/dL — ABNORMAL HIGH (ref 6–23)
CO2: 29 mEq/L (ref 19–32)
Calcium: 9.9 mg/dL (ref 8.4–10.5)
Chloride: 105 mEq/L (ref 96–112)
Creatinine, Ser: 1.44 mg/dL (ref 0.40–1.50)
GFR: 47.54 mL/min — ABNORMAL LOW (ref >60.00)
Glucose, Bld: 106 mg/dL — ABNORMAL HIGH (ref 70–99)
Potassium: 4.1 mEq/L (ref 3.5–5.1)
Sodium: 141 mEq/L (ref 135–145)
Total Bilirubin: 0.8 mg/dL (ref 0.2–1.2)
Total Protein: 7 g/dL (ref 6.0–8.3)

## 2020-05-25 LAB — CBC
HCT: 45.4 % (ref 39.0–52.0)
Hemoglobin: 15.5 g/dL (ref 13.0–17.0)
MCHC: 34 g/dL (ref 30.0–36.0)
MCV: 90.1 fl (ref 78.0–100.0)
Platelets: 251 10*3/uL (ref 150.0–400.0)
RBC: 5.04 Mil/uL (ref 4.22–5.81)
RDW: 13.3 % (ref 11.5–15.5)
WBC: 5.1 10*3/uL (ref 4.0–10.5)

## 2020-05-25 LAB — LIPID PANEL
Cholesterol: 160 mg/dL (ref 0–200)
HDL: 37.8 mg/dL — ABNORMAL LOW (ref >39.00)
LDL Cholesterol: 94 mg/dL (ref 0–99)
NonHDL: 122.39
Total CHOL/HDL Ratio: 4
Triglycerides: 144 mg/dL (ref 0.0–149.0)
VLDL: 28.8 mg/dL (ref 0.0–40.0)

## 2020-05-25 LAB — HEMOGLOBIN A1C: Hgb A1c MFr Bld: 5.9 % (ref 4.6–6.5)

## 2020-05-25 LAB — PSA, MEDICARE: PSA: 0 ng/ml — ABNORMAL LOW (ref 0.10–4.00)

## 2020-05-25 NOTE — Progress Notes (Signed)
Subjective:   Curtis Black. is a 79 y.o. male who presents for Medicare Annual/Subsequent preventive examination.  Review of Systems: N/A      I connected with the patient today by telephone and verified that I am speaking with the correct person using two identifiers. Location patient: home Location nurse: work Persons participating in the telephone visit: patient, nurse.   I discussed the limitations, risks, security and privacy concerns of performing an evaluation and management service by telephone and the availability of in person appointments. I also discussed with the patient that there may be a patient responsible charge related to this service. The patient expressed understanding and verbally consented to this telephonic visit.        Cardiac Risk Factors include: advanced age (>96men, >93 women);hypertension;male gender;Other (see comment), Risk factor comments: hyperlipidemia     Objective:    Today's Vitals   There is no height or weight on file to calculate BMI.  Advanced Directives 05/25/2020 04/19/2018 03/30/2017  Does Patient Have a Medical Advance Directive? Yes Yes Yes  Type of Paramedic of [place];Living will [place];Living will [place];Living will  Does patient want to make changes to medical advance directive? - - No - Patient declined  Copy of [place] in Chart? No - copy requested No - copy requested No - copy requested    Current Medications (verified) Outpatient Encounter Medications as of 05/25/2020  Medication Sig   amLODipine (NORVASC) 10 MG tablet TAKE 1 TABLET BY MOUTH EVERY DAY FOR BLOOD PRESSURE   atorvastatin (LIPITOR) 20 MG tablet TAKE 1 TABLET DAILY FOR    CHOLESTEROL   Coenzyme Q10 (COQ10 PO) Take 1 capsule by mouth daily.    fenofibrate micronized (LOFIBRA) 134 MG capsule TAKE 1 CAPSULE DAILY BEFOREBREAKFAST   GLUCOSAMINE SULFATE PO Take by mouth.    losartan (COZAAR) 100 MG tablet TAKE 1 TABLET DAILY FOR    BLOOD PRESSURE   Multiple Vitamin (MULTIVITAMIN) tablet Take 1 tablet by mouth daily.   Omega-3 Fatty Acids (FISH OIL) 1000 MG CAPS Take 1,000 mg by mouth 2 (two) times daily.   sertraline (ZOLOFT) 50 MG tablet TAKE 1 TABLET BY MOUTH EVERY DAY   No facility-administered encounter medications on file as of 05/25/2020.    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   CKD (chronic kidney disease) stage 3, GFR 30-59 ml/min    Essential hypertension    Hyperlipidemia    Prostate cancer ([place]) 2009   Psoriasis    Past Surgical History:  Procedure Laterality Date   APPENDECTOMY     BOWEL RESECTION     COLON SURGERY     COLONOSCOPY WITH PROPOFOL N/A 06/05/2017   Procedure: COLONOSCOPY WITH PROPOFOL;  Surgeon: Jonathon Bellows, MD;  Location: [facility] ENDOSCOPY;  Service: Gastroenterology;  Laterality: N/A;   KNEE ARTHROPLASTY Right    PROSTATECTOMY     thyroid biop     Family History  Problem Relation Age of Onset   Diabetes Sister    Social History   Socioeconomic History   Marital status: Married    Spouse name: Not on  file   Number of children: Not on file   Years of education: Not on file   Highest education level: Not on file  Occupational History   Not on file  Tobacco Use   Smoking status: Former Smoker   Smokeless tobacco: Never Used   Tobacco comment: 2000  Vaping Use   Vaping Use: Never used  Substance and Sexual Activity   Alcohol use: Yes    Alcohol/week: 7.0 standard drinks    Types: 7 Glasses of wine per week   Drug use: Not Currently   Sexual activity: Not Currently  Other Topics  Concern   Not on file  Social History Narrative   Married.   No children.   Retired. Once worked for [employer].   Enjoys working on his house, playing golf, riding his bike.    Social Determinants of Health   Financial Resource Strain:    Difficulty of Paying Living Expenses: Not on file  Food Insecurity:    Worried About Charity fundraiser in the Last Year: Not on file   [employer] of Food in the Last Year: Not on file  Transportation Needs:    Lack of Transportation (Medical): Not on file   Lack of Transportation (Non-Medical): Not on file  Physical Activity:    Days of Exercise per Week: Not on file   Minutes of Exercise per Session: Not on file  Stress:    Feeling of Stress : Not on file  Social Connections:    Frequency of Communication with Friends and Family: Not on file   Frequency of Social Gatherings with Friends and Family: Not on file   Attends Religious Services: Not on file   Active Member of Clubs or Organizations: Not on file   Attends Archivist Meetings: Not on file   Marital Status: Not on file    Tobacco Counseling Counseling given: Not Answered Comment: 2000   Clinical Intake:  Pre-visit preparation completed: Yes  Pain : No/denies pain     Nutritional Risks: None Diabetes: No  How often do you need to have someone help you when you read instructions, pamphlets, or other written materials from your doctor or pharmacy?: 1 - Never What is the last grade level you completed in school?: PhD  Diabetic: No Nutrition Risk Assessment:  Has the patient had any N/V/D within the last 2 months?  No  Does the patient have any non-healing wounds?  No  Has the patient had any unintentional weight loss or weight gain?  No   Diabetes:  Is the patient diabetic?  No  If diabetic, was a CBG obtained today?  N/A Did the patient bring in their glucometer from home?  N/A How often do you monitor your CBG's? N/A.   Financial Strains  and Diabetes Management:  Are you having any financial strains with the device, your supplies or your medication? N/A.  Does the patient want to be seen by Chronic Care Management for management of their diabetes?  N/A Would the patient like to be referred to a Nutritionist or for Diabetic Management?  N/A     Interpreter Needed?: No  Information entered by :: CJohnson, LPn   Activities of Daily Living In your present state of health, do you have any difficulty performing the following activities: 05/25/2020 12/16/2019  Hearing? N N  Vision? N N  Difficulty concentrating or making decisions? N N  Walking or climbing stairs? N N  Dressing or bathing? N N  Doing errands, shopping? N -  Preparing Food and eating ? N -  Using the Toilet? N -  In the past six months, have you accidently leaked urine? N -  Do you have problems with loss of bowel control? N -  Managing your Medications? N -  Managing your Finances? N -  Housekeeping or managing your Housekeeping? N -  Some recent data might be hidden    Patient Care Team: Pleas Koch, NP as PCP - General (Internal Medicine) Payton Spark as Consulting Physician (Dentistry) Ralene Bathe, MD (Dermatology)  Indicate any recent Medical Services you may have received from other than [facility] providers in the past year (date may be approximate).     Assessment:   This is a routine wellness examination for [place].  Hearing/Vision screen  Hearing Screening   125Hz  250Hz  500Hz  1000Hz  2000Hz  3000Hz  4000Hz  6000Hz  8000Hz   Right ear:           Left ear:           Vision Screening Comments: Patient gets annual eye exams.  Dietary issues and exercise activities discussed: Current Exercise Habits: Home exercise routine, Type of exercise: Other - see comments (golf), Time (Minutes): > 60, Frequency (Times/Week): 3, Weekly Exercise (Minutes/Week): 0, Intensity: Moderate, Exercise limited by: None identified  Goals     Exercise 6x  per week (30 min per time)     Starting 04/19/2018, I will continue working out 6x per week for 1.5-5 hours per day.       Patient Stated     05/25/2020, I will continue to play golf 3 days a week for 4-5 hours.      Depression Screen PHQ 2/9 Scores 05/25/2020 05/15/2019 04/19/2018 03/30/2017  PHQ - 2 Score 0 0 0 0  PHQ- 9 Score 0 - 0 -    Fall Risk Fall Risk  05/25/2020 05/15/2019 04/19/2018 03/30/2017  Falls in the past year? 0 0 No No  Number falls in past yr: 0 0 - -  Injury with Fall? 0 0 - -  Risk for fall due to : Medication side effect - - -  Follow up Falls evaluation completed;Falls prevention discussed - - -    Any stairs in or around the home? Yes  If so, are there any without handrails? No  Home free of loose throw rugs in walkways, pet beds, electrical cords, etc? Yes  Adequate lighting in your home to reduce risk of falls? Yes   ASSISTIVE DEVICES UTILIZED TO PREVENT FALLS:  Life alert? No  Use of a cane, walker or w/c? No  Grab bars in the bathroom? No  Shower chair or bench in shower? No  Elevated toilet seat or a handicapped toilet? No   TIMED UP AND GO:  Was the test performed? N/A, telephonic visit .    Cognitive Function: MMSE - Mini Mental State Exam 05/25/2020 04/19/2018 03/30/2017  Orientation to time 5 5 5   Orientation to Place 5 5 5   Registration 3 3 3   Attention/ Calculation 5 0 0  Recall 3 3 3   Language- name 2 objects - 0 0  Language- repeat 1 1 1   Language- follow 3 step command - 3 3  Language- read & follow direction - 0 0  Write a sentence - 0 0  Copy design - 0 0  Total score - 20 20  Mini Cog  Mini-Cog screen was completed. Maximum score is 22. A value of 0 denotes this  part of the MMSE was not completed or the patient failed this part of the Mini-Cog screening.       Immunizations Immunization History  Administered Date(s) Administered   Fluad Quad(high Dose 65+) 05/09/2019   Influenza, High Dose Seasonal PF 05/26/2014,  06/13/2017, 05/28/2018, 05/13/2019, 05/19/2020   PFIZER SARS-COV-2 Vaccination 10/02/2019, 10/23/2019   Pneumococcal Conjugate-13 04/06/2017   Pneumococcal Polysaccharide-23 01/05/2010, 06/18/2018, 04/29/2019   Tdap 11/13/2017   Zoster 08/28/2014   Zoster Recombinat (Shingrix) 05/28/2018, 08/26/2018, 04/29/2019    TDAP status: Up to date Flu Vaccine status: Up to date Pneumococcal vaccine status: Up to date Covid-19 vaccine status: Completed vaccines  Qualifies for Shingles Vaccine? Yes   Zostavax completed Yes   Shingrix Completed?: Yes  Screening Tests Health Maintenance  Topic Date Due   Hepatitis C Screening  Never done   COLONOSCOPY  06/05/2020   TETANUS/TDAP  11/14/2027   INFLUENZA VACCINE  Completed   COVID-19 Vaccine  Completed   PNA vac Low Risk Adult  Completed    Health Maintenance  Health Maintenance Due  Topic Date Due   Hepatitis C Screening  Never done    Colorectal cancer screening: Completed 06/05/2017. Repeat every 3 years scheduled 06/15/2020  Lung Cancer Screening: (Low Dose CT Chest recommended if Age 2-80 years, 30 pack-year currently smoking OR have quit w/in 15years.) does not qualify.   Additional Screening:  Hepatitis C Screening: does qualify; Completed 05/25/2020  Vision Screening: Recommended annual ophthalmology exams for early detection of glaucoma and other disorders of the eye. Is the patient up to date with their annual eye exam?  Yes  Who is the provider or what is the name of the office in which the patient attends annual eye exams? [facility] If pt is not established with a provider, would they like to be referred to a provider to establish care? No .   Dental Screening: Recommended annual dental exams for proper oral hygiene  Community Resource Referral / Chronic Care Management: CRR required this visit?  No   CCM required this visit?  No      Plan:     I have personally reviewed and noted the  following in the patients chart:    Medical and social history  Use of alcohol, tobacco or illicit drugs   Current medications and supplements  Functional ability and status  Nutritional status  Physical activity  Advanced directives  List of other physicians  Hospitalizations, surgeries, and ER visits in previous 12 months  Vitals  Screenings to include cognitive, depression, and falls  Referrals and appointments  In addition, I have reviewed and discussed with patient certain preventive protocols, quality metrics, and best practice recommendations. A written personalized care plan for preventive services as well as general preventive health recommendations were provided to patient.   Due to this being a telephonic visit, the after visit summary with patients personalized plan was offered to patient via office or my-chart.  Patient preferred to pick up at office at next visit or via mychart.   Andrez Grime, LPN   [id number]

## 2020-05-25 NOTE — Progress Notes (Signed)
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

## 2020-05-25 NOTE — Patient Instructions (Signed)
Curtis Black , Thank you for taking time to come for your Medicare Wellness Visit. I appreciate your ongoing commitment to your health goals. Please review the following plan we discussed and let me know if I can assist you in the future.   Screening recommendations/referrals: Colonoscopy: scheduled for 06/15/2020 Recommended yearly ophthalmology/optometry visit for glaucoma screening and checkup Recommended yearly dental visit for hygiene and checkup  Vaccinations: Influenza vaccine: Up to date, completed 05/19/2020, due 03/2021 Pneumococcal vaccine: Completed series Tdap vaccine: Up to date, completed 11/13/2017, due 32029 Shingles vaccine: Completed series   Covid-19: Completed series  Advanced directives: Please bring a copy of your POA (Power of Attorney) and/or Living Will to your next appointment.   Conditions/risks identified: hypertension, hyperlipidemia  Next appointment: Follow up in one year for your annual wellness visit.   Preventive Care 42 Years and Older, Male Preventive care refers to lifestyle choices and visits with your health care provider that can promote health and wellness. What does preventive care include?  A yearly physical exam. This is also called an annual well check.  Dental exams once or twice a year.  Routine eye exams. Ask your health care provider how often you should have your eyes checked.  Personal lifestyle choices, including:  Daily care of your teeth and gums.  Regular physical activity.  Eating a healthy diet.  Avoiding tobacco and drug use.  Limiting alcohol use.  Practicing safe sex.  Taking low doses of aspirin every day.  Taking vitamin and mineral supplements as recommended by your health care provider. What happens during an annual well check? The services and screenings done by your health care provider during your annual well check will depend on your age, overall health, lifestyle risk factors, and family history of  disease. Counseling  Your health care provider may ask you questions about your:  Alcohol use.  Tobacco use.  Drug use.  Emotional well-being.  Home and relationship well-being.  Sexual activity.  Eating habits.  History of falls.  Memory and ability to understand (cognition).  Work and work Statistician. Screening  You may have the following tests or measurements:  Height, weight, and BMI.  Blood pressure.  Lipid and cholesterol levels. These may be checked every 5 years, or more frequently if you are over 77 years old.  Skin check.  Lung cancer screening. You may have this screening every year starting at age 76 if you have a 30-pack-year history of smoking and currently smoke or have quit within the past 15 years.  Fecal occult blood test (FOBT) of the stool. You may have this test every year starting at age 95.  Flexible sigmoidoscopy or colonoscopy. You may have a sigmoidoscopy every 5 years or a colonoscopy every 10 years starting at age 71.  Prostate cancer screening. Recommendations will vary depending on your family history and other risks.  Hepatitis C blood test.  Hepatitis B blood test.  Sexually transmitted disease (STD) testing.  Diabetes screening. This is done by checking your blood sugar (glucose) after you have not eaten for a while (fasting). You may have this done every 1-3 years.  Abdominal aortic aneurysm (AAA) screening. You may need this if you are a current or former smoker.  Osteoporosis. You may be screened starting at age 15 if you are at high risk. Talk with your health care provider about your test results, treatment options, and if necessary, the need for more tests. Vaccines  Your health care provider may recommend certain  vaccines, such as:  Influenza vaccine. This is recommended every year.  Tetanus, diphtheria, and acellular pertussis (Tdap, Td) vaccine. You may need a Td booster every 10 years.  Zoster vaccine. You may  need this after age 63.  Pneumococcal 13-valent conjugate (PCV13) vaccine. One dose is recommended after age 25.  Pneumococcal polysaccharide (PPSV23) vaccine. One dose is recommended after age 22. Talk to your health care provider about which screenings and vaccines you need and how often you need them. This information is not intended to replace advice given to you by your health care provider. Make sure you discuss any questions you have with your health care provider. Document Released: 09/10/2015 Document Revised: 05/03/2016 Document Reviewed: 06/15/2015 Elsevier Interactive Patient Education  2017 [employer] Prevention in the Home Falls can cause injuries. They can happen to people of all ages. There are many things you can do to make your home safe and to help prevent falls. What can I do on the outside of my home?  Regularly fix the edges of walkways and driveways and fix any cracks.  Remove anything that might make you trip as you walk through a door, such as a raised step or threshold.  Trim any bushes or trees on the path to your home.  Use bright outdoor lighting.  Clear any walking paths of anything that might make someone trip, such as rocks or tools.  Regularly check to see if handrails are loose or broken. Make sure that both sides of any steps have handrails.  Any raised decks and porches should have guardrails on the edges.  Have any leaves, snow, or ice cleared regularly.  Use sand or salt on walking paths during winter.  Clean up any spills in your garage right away. This includes oil or grease spills. What can I do in the bathroom?  Use night lights.  Install grab bars by the toilet and in the tub and shower. Do not use towel bars as grab bars.  Use non-skid mats or decals in the tub or shower.  If you need to sit down in the shower, use a plastic, non-slip stool.  Keep the floor dry. Clean up any water that spills on the floor as soon as it  happens.  Remove soap buildup in the tub or shower regularly.  Attach bath mats securely with double-sided non-slip rug tape.  Do not have throw rugs and other things on the floor that can make you trip. What can I do in the bedroom?  Use night lights.  Make sure that you have a light by your bed that is easy to reach.  Do not use any sheets or blankets that are too big for your bed. They should not hang down onto the floor.  Have a firm chair that has side arms. You can use this for support while you get dressed.  Do not have throw rugs and other things on the floor that can make you trip. What can I do in the kitchen?  Clean up any spills right away.  Avoid walking on wet floors.  Keep items that you use a lot in easy-to-reach places.  If you need to reach something above you, use a strong step stool that has a grab bar.  Keep electrical cords out of the way.  Do not use floor polish or wax that makes floors slippery. If you must use wax, use non-skid floor wax.  Do not have throw rugs and other things  on the floor that can make you trip. What can I do with my stairs?  Do not leave any items on the stairs.  Make sure that there are handrails on both sides of the stairs and use them. Fix handrails that are broken or loose. Make sure that handrails are as long as the stairways.  Check any carpeting to make sure that it is firmly attached to the stairs. Fix any carpet that is loose or worn.  Avoid having throw rugs at the top or bottom of the stairs. If you do have throw rugs, attach them to the floor with carpet tape.  Make sure that you have a light switch at the top of the stairs and the bottom of the stairs. If you do not have them, ask someone to add them for you. What else can I do to help prevent falls?  Wear shoes that:  Do not have high heels.  Have rubber bottoms.  Are comfortable and fit you well.  Are closed at the toe. Do not wear sandals.  If you  use a stepladder:  Make sure that it is fully opened. Do not climb a closed stepladder.  Make sure that both sides of the stepladder are locked into place.  Ask someone to hold it for you, if possible.  Clearly mark and make sure that you can see:  Any grab bars or handrails.  First and last steps.  Where the edge of each step is.  Use tools that help you move around (mobility aids) if they are needed. These include:  Canes.  Walkers.  Scooters.  Crutches.  Turn on the lights when you go into a dark area. Replace any light bulbs as soon as they burn out.  Set up your furniture so you have a clear path. Avoid moving your furniture around.  If any of your floors are uneven, fix them.  If there are any pets around you, be aware of where they are.  Review your medicines with your doctor. Some medicines can make you feel dizzy. This can increase your chance of falling. Ask your doctor what other things that you can do to help prevent falls. This information is not intended to replace advice given to you by your health care provider. Make sure you discuss any questions you have with your health care provider. Document Released: 06/10/2009 Document Revised: 01/20/2016 Document Reviewed: 09/18/2014 Elsevier Interactive Patient Education  2017 [employer].

## 2020-05-26 LAB — HEPATITIS C ANTIBODY
Hepatitis C Ab: NONREACTIVE
SIGNAL TO CUT-OFF: 0.01 (ref <1.00)

## 2020-05-27 ENCOUNTER — Other Ambulatory Visit: Payer: Self-pay

## 2020-05-27 ENCOUNTER — Ambulatory Visit (INDEPENDENT_AMBULATORY_CARE_PROVIDER_SITE_OTHER): Payer: Medicare Other | Admitting: Primary Care

## 2020-05-27 ENCOUNTER — Encounter: Payer: Self-pay | Admitting: Primary Care

## 2020-05-27 VITALS — BP 136/78 | HR 58 | Temp 98.5°F | Ht 74.0 in | Wt 243.0 lb

## 2020-05-27 DIAGNOSIS — N183 Chronic kidney disease, stage 3 unspecified: Secondary | ICD-10-CM

## 2020-05-27 DIAGNOSIS — E785 Hyperlipidemia, unspecified: Secondary | ICD-10-CM | POA: Diagnosis not present

## 2020-05-27 DIAGNOSIS — D119 Benign neoplasm of major salivary gland, unspecified: Secondary | ICD-10-CM | POA: Diagnosis not present

## 2020-05-27 DIAGNOSIS — I10 Essential (primary) hypertension: Secondary | ICD-10-CM | POA: Diagnosis not present

## 2020-05-27 DIAGNOSIS — R7303 Prediabetes: Secondary | ICD-10-CM | POA: Diagnosis not present

## 2020-05-27 DIAGNOSIS — F411 Generalized anxiety disorder: Secondary | ICD-10-CM | POA: Diagnosis not present

## 2020-05-27 MED ORDER — ATORVASTATIN CALCIUM 20 MG PO TABS: 20.0000 mg | ORAL_TABLET | Freq: Every day | ORAL | 3 refills | Status: DC

## 2020-05-27 MED ORDER — SERTRALINE HCL 50 MG PO TABS: 50.0000 mg | ORAL_TABLET | Freq: Every day | ORAL | 3 refills | Status: DC

## 2020-05-27 NOTE — Progress Notes (Signed)
Subjective:    Patient ID: Curtis Black., male    DOB: Dec 19, 1940, 79 y.o.   MRN: [id number]  HPI  This visit occurred during the SARS-CoV-2 public health emergency.  Safety protocols were in place, including screening questions prior to the visit, additional usage of staff PPE, and extensive cleaning of exam room while observing appropriate contact time as indicated for disinfecting solutions.   [name] is a 79 year old male who presents today for [place] Part 2 and follow up of chronic conditions.  Overall doing well, compliant to all medications.  Doing well on sertraline 50 mg, denies SI/HI.  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 Overall he's not noticed enlargement or redness. Denies pain.  He is checking his BP at home which is running mostly 120's-130's/80's. He denies headaches, dizziness, chest pain.  BP Readings from Last 3 Encounters:  05/27/20 136/78  12/16/19 (!) 148/71  11/17/19 138/80    Immunizations: -Tetanus: Completed in 2019 -Influenza: Completed this season  -Shingles: Completed series -Pneumonia: Completed Prevnar and Pneumovax -Covid-19: Completed series   Colonoscopy: Scheduled for October 2021 PSA: Undetectable. Hep C Screen: Negative    Review of Systems  Constitutional: Negative for unexpected weight change.  HENT: Negative for rhinorrhea.   Respiratory: Negative for cough and shortness of breath.   Cardiovascular: Negative for chest pain.  Gastrointestinal: Negative for constipation and diarrhea.  Musculoskeletal: Negative for arthralgias.  Skin: Negative for rash.  Allergic/Immunologic: Negative for environmental allergies.  Neurological: Negative for dizziness, numbness and headaches.  Psychiatric/Behavioral: The patient is not nervous/anxious.        Feels well managed on Zoloft        Past Medical History:  Diagnosis Date  . Anxiety   . Basal cell carcinoma 03/06/2017   left medial infraorbital lat  nose inf to med canthus  . Basal cell carcinoma 06/12/2019   sup to glabella  . 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  . CKD (chronic kidney disease) stage 3, GFR 30-59 ml/min   . Essential hypertension   . Hyperlipidemia   . Prostate cancer ([place]) 2009  . Psoriasis      Social History   Socioeconomic History  . Marital status: Married    Spouse name: Not on file  . Number of children: Not on file  . Years of education: Not on file  . Highest education level: Not on file  Occupational History  . Not on file  Tobacco Use  . Smoking status: Former Research scientist (life sciences)  . Smokeless tobacco: Never Used  . Tobacco comment: 2000  Vaping Use  . Vaping Use: Never used  Substance and Sexual Activity  . Alcohol use: Yes    Alcohol/week: 7.0 standard drinks    Types: 7 Glasses of wine per week  . Drug use: Not Currently  . Sexual activity: Not Currently  Other Topics Concern  . Not on file  Social History Narrative   Married.   No children.   Retired. Once worked for [employer].   Enjoys working on his house, playing golf, riding his bike.    Social Determinants of Health   Financial Resource Strain:   . Difficulty of Paying Living Expenses: Not on file  Food Insecurity:   . Worried About Charity fundraiser in the Last Year: Not on file  . Ran Out of Food in the Last Year: Not on file  Transportation Needs:   . Film/video editor (Medical): Not on file  . Lack of Transportation (Non-Medical): Not on file  Physical Activity:   . Days of Exercise per Week: Not on file  . Minutes of Exercise per Session: Not on file  Stress:   . Feeling of Stress : Not on file  Social Connections:   . Frequency of Communication with Friends and Family: Not on file  . Frequency of Social Gatherings with Friends and Family: Not on file  . Attends Religious Services: Not on file  . Active Member of Clubs or Organizations: Not on file  .  Attends Archivist Meetings: Not on file  . Marital Status: Not on file  Intimate Partner Violence:   . Fear of Current or Ex-Partner: Not on file  . Emotionally Abused: Not on file  . Physically Abused: Not on file  . Sexually Abused: Not on file    Past Surgical History:  Procedure Laterality Date  . APPENDECTOMY    . BOWEL RESECTION    . COLON SURGERY    . COLONOSCOPY WITH PROPOFOL N/A 06/05/2017   Procedure: COLONOSCOPY WITH PROPOFOL;  Surgeon: Jonathon Bellows, MD;  [facility];  Service: Gastroenterology;  Laterality: N/A;  . KNEE ARTHROPLASTY Right   . PROSTATECTOMY    . thyroid biop      Family History  Problem Relation Age of Onset  . Diabetes Sister     Allergies  Allergen Reactions  . Lidocaine     Current Outpatient Medications on File Prior to Visit  Medication Sig Dispense Refill  . amLODipine (NORVASC) 10 MG tablet TAKE 1 TABLET BY MOUTH EVERY DAY FOR BLOOD PRESSURE 90 tablet 1  . Coenzyme Q10 (COQ10 PO) Take 1 capsule by mouth daily.     . fenofibrate micronized (LOFIBRA) 134 MG capsule TAKE 1 CAPSULE DAILY BEFOREBREAKFAST 90 capsule 0  . GLUCOSAMINE SULFATE PO Take by mouth.    . losartan (COZAAR) 100 MG tablet TAKE 1 TABLET DAILY FOR    BLOOD PRESSURE 90 tablet 1  . Multiple Vitamin (MULTIVITAMIN) tablet Take 1 tablet by mouth daily.    . Omega-3 Fatty Acids (FISH OIL) 1000 MG CAPS Take 1,000 mg by mouth 2 (two) times daily.     No current facility-administered medications on file prior to visit.    BP 136/78   Pulse (!) 58   Temp 98.5 F (36.9 C) (Temporal)   Ht 6\' 2"  (1.88 m)   Wt 243 lb (110.2 kg)   SpO2 96%   BMI 31.20 kg/m    Objective:   Physical Exam HENT:     Right Ear: Tympanic membrane and ear canal normal.     Left Ear: Tympanic membrane and ear canal normal.  Eyes:     Pupils: Pupils are equal, round, and reactive to light.  Cardiovascular:     Rate and Rhythm: Normal rate and regular rhythm.  Pulmonary:      Effort: Pulmonary effort is normal.     Breath sounds: Normal breath sounds.  Abdominal:     General: Bowel sounds are normal.     Palpations: Abdomen is soft.     Tenderness: There is no abdominal tenderness.  Musculoskeletal:        General: Normal range of motion.     Cervical back: Neck supple.  Skin:    General: Skin is warm and dry.  Neurological:     Mental Status: He is alert and oriented  to person, place, and time.     Cranial Nerves: No cranial nerve deficit.     Deep Tendon Reflexes:     Reflex Scores:      Patellar reflexes are 2+ on the right side and 2+ on the left side. Psychiatric:        Mood and Affect: Mood normal.            Assessment & Plan:

## 2020-05-27 NOTE — Patient Instructions (Signed)
It's important to improve your diet by reducing consumption of fast food, fried food, processed snack foods, sugary drinks. Increase consumption of fresh vegetables and fruits, whole grains, water.  Ensure you are drinking 64 ounces of water daily.  Start exercising. You should be getting 150 minutes of moderate intensity exercise weekly.  It was a pleasure to see you today!

## 2020-05-27 NOTE — Assessment & Plan Note (Signed)
Controlled on atorvastatin and fenofibrate.  Continue same.

## 2020-05-27 NOTE — Assessment & Plan Note (Signed)
Benign parotid gland tumor. Appears stable compared to last visit. Continue to monitor.

## 2020-05-27 NOTE — Assessment & Plan Note (Signed)
Stable and exactly the same as last year. Continue to monitor.

## 2020-05-27 NOTE — Assessment & Plan Note (Signed)
Stable compared to last check. He admits to increased alcohol intake and little water intake at times. Discussed to increase water intake.  Managed on ARB for renal protection. Continue to monitor.

## 2020-05-27 NOTE — Assessment & Plan Note (Signed)
Stable in the office today. Continue amlodipine and losartan. CMP reviewed.

## 2020-05-27 NOTE — Assessment & Plan Note (Signed)
Doing well overall on sertraline 50 mg, continue same.

## 2020-06-08 DIAGNOSIS — Z23 Encounter for immunization: Secondary | ICD-10-CM | POA: Diagnosis not present

## 2020-06-11 ENCOUNTER — Other Ambulatory Visit
Admission: RE | Admit: 2020-06-11 | Discharge: 2020-06-11 | Disposition: A | Discharge status: Home / Self Care | Payer: Medicare Other | Source: Ambulatory Visit | Attending: Gastroenterology | Admitting: Gastroenterology

## 2020-06-11 ENCOUNTER — Other Ambulatory Visit: Payer: Self-pay

## 2020-06-11 DIAGNOSIS — Z20822 Contact with and (suspected) exposure to covid-19: Secondary | ICD-10-CM | POA: Insufficient documentation

## 2020-06-11 DIAGNOSIS — Z01812 Encounter for preprocedural laboratory examination: Secondary | ICD-10-CM | POA: Diagnosis not present

## 2020-06-12 LAB — SARS CORONAVIRUS 2 (TAT 6-24 HRS): SARS Coronavirus 2: NEGATIVE

## 2020-06-15 ENCOUNTER — Encounter: Payer: Self-pay | Admitting: Gastroenterology

## 2020-06-15 ENCOUNTER — Ambulatory Visit
Admission: RE | Admit: 2020-06-15 | Discharge: 2020-06-15 | Disposition: A | Discharge status: Home / Self Care | Payer: Medicare Other | Attending: Gastroenterology | Admitting: Gastroenterology

## 2020-06-15 ENCOUNTER — Other Ambulatory Visit: Payer: Self-pay

## 2020-06-15 ENCOUNTER — Ambulatory Visit: Payer: Medicare Other | Admitting: Certified Registered"

## 2020-06-15 ENCOUNTER — Encounter
Admission: RE | Disposition: A | Discharge status: Home / Self Care | Payer: Self-pay | Source: Home / Self Care | Attending: Gastroenterology

## 2020-06-15 DIAGNOSIS — I129 Hypertensive chronic kidney disease with stage 1 through stage 4 chronic kidney disease, or unspecified chronic kidney disease: Secondary | ICD-10-CM | POA: Insufficient documentation

## 2020-06-15 DIAGNOSIS — Z9049 Acquired absence of other specified parts of digestive tract: Secondary | ICD-10-CM | POA: Diagnosis not present

## 2020-06-15 DIAGNOSIS — K5730 Diverticulosis of large intestine without perforation or abscess without bleeding: Secondary | ICD-10-CM | POA: Insufficient documentation

## 2020-06-15 DIAGNOSIS — F419 Anxiety disorder, unspecified: Secondary | ICD-10-CM | POA: Diagnosis not present

## 2020-06-15 DIAGNOSIS — Z884 Allergy status to anesthetic agent status: Secondary | ICD-10-CM | POA: Insufficient documentation

## 2020-06-15 DIAGNOSIS — Z1211 Encounter for screening for malignant neoplasm of colon: Secondary | ICD-10-CM | POA: Insufficient documentation

## 2020-06-15 DIAGNOSIS — D122 Benign neoplasm of ascending colon: Secondary | ICD-10-CM | POA: Diagnosis not present

## 2020-06-15 DIAGNOSIS — Z8546 Personal history of malignant neoplasm of prostate: Secondary | ICD-10-CM | POA: Diagnosis not present

## 2020-06-15 DIAGNOSIS — Z85828 Personal history of other malignant neoplasm of skin: Secondary | ICD-10-CM | POA: Diagnosis not present

## 2020-06-15 DIAGNOSIS — Z79899 Other long term (current) drug therapy: Secondary | ICD-10-CM | POA: Insufficient documentation

## 2020-06-15 DIAGNOSIS — K635 Polyp of colon: Secondary | ICD-10-CM | POA: Diagnosis not present

## 2020-06-15 DIAGNOSIS — Z9079 Acquired absence of other genital organ(s): Secondary | ICD-10-CM | POA: Diagnosis not present

## 2020-06-15 DIAGNOSIS — Z87891 Personal history of nicotine dependence: Secondary | ICD-10-CM | POA: Diagnosis not present

## 2020-06-15 DIAGNOSIS — E785 Hyperlipidemia, unspecified: Secondary | ICD-10-CM | POA: Insufficient documentation

## 2020-06-15 DIAGNOSIS — K5790 Diverticulosis of intestine, part unspecified, without perforation or abscess without bleeding: Secondary | ICD-10-CM | POA: Diagnosis not present

## 2020-06-15 DIAGNOSIS — Z86010 Personal history of colonic polyps: Secondary | ICD-10-CM | POA: Diagnosis not present

## 2020-06-15 DIAGNOSIS — N183 Chronic kidney disease, stage 3 unspecified: Secondary | ICD-10-CM | POA: Diagnosis not present

## 2020-06-15 HISTORY — PX: COLONOSCOPY WITH PROPOFOL: SHX5780

## 2020-06-15 SURGERY — COLONOSCOPY WITH PROPOFOL
Anesthesia: General

## 2020-06-15 MED ORDER — SODIUM CHLORIDE 0.9 % IV SOLN: INTRAVENOUS | Status: DC

## 2020-06-15 MED ORDER — PROPOFOL 500 MG/50ML IV EMUL
INTRAVENOUS | Status: DC | PRN
  Administered 2020-06-15: 165 ug/kg/min via INTRAVENOUS

## 2020-06-15 MED ORDER — PROPOFOL 10 MG/ML IV BOLUS
INTRAVENOUS | Status: DC | PRN
  Administered 2020-06-15: 50 mg via INTRAVENOUS
  Administered 2020-06-15 (×2): 10 mg via INTRAVENOUS

## 2020-06-15 MED ORDER — EPHEDRINE SULFATE 50 MG/ML IJ SOLN
INTRAMUSCULAR | Status: DC | PRN
  Administered 2020-06-15: 10 mg via INTRAVENOUS

## 2020-06-15 MED ORDER — GLYCOPYRROLATE 0.2 MG/ML IJ SOLN
INTRAMUSCULAR | Status: DC | PRN
  Administered 2020-06-15: .2 mg via INTRAVENOUS

## 2020-06-15 NOTE — Op Note (Signed)
[facility] Gastroenterology Patient Name: Jerret Mcbane Procedure Date: 06/15/2020 8:23 AM MRN: [id number] Account #: [id number] Date of Birth: 1941-08-06 Admit Type: Outpatient Age: 79 Room: [facility] ENDO ROOM 1 Gender: Male Note Status: Finalized Procedure:             Colonoscopy Indications:           High risk colon cancer surveillance: Personal history                         of multiple (3 or more) adenomas, Last colonoscopy:                         October 2018 Providers:             Jonathon Bellows MD, MD Medicines:             Monitored Anesthesia Care Complications:         No immediate complications. Procedure:             Pre-Anesthesia Assessment:                        - Prior to the procedure, a History and Physical was                         performed, and patient medications, allergies and                         sensitivities were reviewed. The patient's tolerance                         of previous anesthesia was reviewed.                        - The risks and benefits of the procedure and the                         sedation options and risks were discussed with the                         patient. All questions were answered and informed                         consent was obtained.                        - ASA Grade Assessment: II - A patient with mild                         systemic disease.                        After obtaining informed consent, the colonoscope was                         passed under direct vision. Throughout the procedure,                         the patient's blood pressure, pulse, and oxygen  saturations were monitored continuously. The                         Colonoscope was introduced through the anus and                         advanced to the the cecum, identified by the                         appendiceal orifice. The colonoscopy was performed                         with ease. The patient  tolerated the procedure well.                         The quality of the bowel preparation was poor. Findings:      Three sessile polyps were found in the ascending colon. The polyps were       4 to 6 mm in size. These polyps were removed with a cold snare.       Resection was complete, but the polyp tissue was only partially       retrieved.      Multiple small-mouthed diverticula were found in the entire colon.      Copious quantities of semi-solid stool was found in the entire colon,       interfering with visualization.      The exam was otherwise without abnormality on direct and retroflexion       views. Impression:            - Preparation of the colon was poor.                        - Three 4 to 6 mm polyps in the ascending colon,                         removed with a cold snare. Complete resection. Partial                         retrieval.                        - Diverticulosis in the entire examined colon.                        - Stool in the entire examined colon.                        - The examination was otherwise normal on direct and                         retroflexion views. Recommendation:        - Discharge patient to home (with escort).                        - Resume previous diet.                        - Continue present medications.                        -  Await pathology results.                        - Repeat colonoscopy in 6 months because the bowel                         preparation was suboptimal. Procedure Code(s):     --- Professional ---                        [id number], Colonoscopy, flexible; with removal of                         tumor(s), polyp(s), or other lesion(s) by snare                         technique Diagnosis Code(s):     --- Professional ---                        Z86.010, Personal history of colonic polyps                        K63.5, Polyp of colon                        K57.30, Diverticulosis of large intestine without                          perforation or abscess without bleeding CPT copyright 2019 American Medical Association. All rights reserved. The codes documented in this report are preliminary and upon coder review may  be revised to meet current compliance requirements. Jonathon Bellows, MD Jonathon Bellows MD, MD 06/15/2020 8:49:14 AM This report has been signed electronically. Number of Addenda: 0 Note Initiated On: 06/15/2020 8:23 AM Scope Withdrawal Time: 0 hours 6 minutes 29 seconds  Total Procedure Duration: 0 hours 13 minutes 0 seconds  Estimated Blood Loss:  Estimated blood loss: none.      [facility]

## 2020-06-15 NOTE — Anesthesia Preprocedure Evaluation (Addendum)
Anesthesia Evaluation  Patient identified by MRN, date of birth, ID band Patient awake    Reviewed: Allergy & Precautions, H&P , NPO status , Patient's Chart, lab work & pertinent test results  History of Anesthesia Complications Negative for: history of anesthetic complications  Airway Mallampati: III  TM Distance: >3 FB     Dental  (+) Chipped   Pulmonary neg sleep apnea, neg COPD, former smoker,    breath sounds clear to auscultation       Cardiovascular hypertension, (-) angina(-) Past MI and (-) Cardiac Stents (-) dysrhythmias  Rhythm:regular Rate:Normal     Neuro/Psych PSYCHIATRIC DISORDERS Anxiety negative neurological ROS     GI/Hepatic negative GI ROS, Neg liver ROS,   Endo/Other  negative endocrine ROS  Renal/GU Renal disease (CKD)  negative genitourinary   Musculoskeletal   Abdominal   Peds  Hematology negative hematology ROS (+)   Anesthesia Other Findings Past Medical History: No date: Anxiety 03/06/2017: Basal cell carcinoma     Comment:  left medial infraorbital lat nose inf to med canthus 06/12/2019: Basal cell carcinoma     Comment:  sup to glabella 06/12/2019: Basal cell carcinoma (BCC) of nostril     Comment:  Removed, no other intervention needed per pt. No date: CKD (chronic kidney disease) stage 3, GFR 30-59 ml/min (HCC) No date: Essential hypertension No date: Hyperlipidemia 2009: Prostate cancer ([place]) No date: Psoriasis  Past Surgical History: No date: APPENDECTOMY No date: BOWEL RESECTION No date: COLON SURGERY 06/05/2017: COLONOSCOPY WITH PROPOFOL; N/A     Comment:  Procedure: COLONOSCOPY WITH PROPOFOL;  Surgeon: Jonathon Bellows, MD;  Location: [facility] ENDOSCOPY;  Service:               Gastroenterology;  Laterality: N/A; No date: KNEE ARTHROPLASTY; Right No date: PROSTATECTOMY No date: thyroid biop  BMI    Body Mass Index: 29.53 kg/m       Reproductive/Obstetrics negative OB ROS                            Anesthesia Physical Anesthesia Plan  ASA: II  Anesthesia Plan: General   Post-op Pain Management:    Induction:   PONV Risk Score and Plan: Propofol infusion and TIVA  Airway Management Planned: Nasal Cannula  Additional Equipment:   Intra-op Plan:   Post-operative Plan:   Informed Consent: I have reviewed the patients History and Physical, chart, labs and discussed the procedure including the risks, benefits and alternatives for the proposed anesthesia with the patient or authorized representative who has indicated his/her understanding and acceptance.     Dental Advisory Given  Plan Discussed with: Anesthesiologist, CRNA and Surgeon  Anesthesia Plan Comments:        Anesthesia Quick Evaluation

## 2020-06-15 NOTE — Anesthesia Procedure Notes (Signed)
Procedure Name: General with mask airway Performed by: Fletcher-Harrison, Karmen Altamirano, CRNA Pre-anesthesia Checklist: Patient identified, Emergency Drugs available, Suction available and Patient being monitored Patient Re-evaluated:Patient Re-evaluated prior to induction Oxygen Delivery Method: Simple face mask Induction Type: IV induction Placement Confirmation: positive ETCO2 and CO2 detector       

## 2020-06-15 NOTE — H&P (Signed)
Jonathon Bellows, MD [address], [place], [place], [place], [address] [address], [place], [place], [place], [address] Phone: [phone]  Fax: [phone]  Primary Care Physician:  Pleas Koch, NP   Pre-Procedure History & Physical: HPI:  Curtis Arreola. is a 79 y.o. male is here for an colonoscopy.   Past Medical History:  Diagnosis Date  . Anxiety   . Basal cell carcinoma 03/06/2017   left medial infraorbital lat nose inf to med canthus  . Basal cell carcinoma 06/12/2019   sup to glabella  . 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  . CKD (chronic kidney disease) stage 3, GFR 30-59 ml/min (HCC)   . Essential hypertension   . Hyperlipidemia   . Prostate cancer ([place]) 2009  . Psoriasis     Past Surgical History:  Procedure Laterality Date  . APPENDECTOMY    . BOWEL RESECTION    . COLON SURGERY    . COLONOSCOPY WITH PROPOFOL N/A 06/05/2017   Procedure: COLONOSCOPY WITH PROPOFOL;  Surgeon: Jonathon Bellows, MD;  Location: [facility] ENDOSCOPY;  Service: Gastroenterology;  Laterality: N/A;  . KNEE ARTHROPLASTY Right   . PROSTATECTOMY    . thyroid biop      Prior to Admission medications   Medication Sig Start Date End Date Taking? Authorizing Provider  Coenzyme Q10 (COQ10 PO) Take 1 capsule by mouth daily.    Yes [provider]  fenofibrate micronized (LOFIBRA) 134 MG capsule TAKE 1 CAPSULE DAILY BEFOREBREAKFAST 05/24/20  Yes Pleas Koch, NP  GLUCOSAMINE SULFATE PO Take by mouth.   Yes [provider]  losartan (COZAAR) 100 MG tablet TAKE 1 TABLET DAILY FOR    BLOOD PRESSURE 05/18/20  Yes Pleas Koch, NP  Multiple Vitamin (MULTIVITAMIN) tablet Take 1 tablet by mouth daily.   Yes [provider]  Omega-3 Fatty Acids (FISH OIL) 1000 MG CAPS Take 1,000 mg by mouth 2 (two) times daily.   Yes [provider]  sertraline (ZOLOFT) 50 MG tablet Take 1 tablet (50 mg total)  by mouth daily. For anxiety. 05/27/20  Yes Pleas Koch, NP  amLODipine (NORVASC) 10 MG tablet TAKE 1 TABLET BY MOUTH EVERY DAY FOR BLOOD PRESSURE 05/17/20   Pleas Koch, NP  atorvastatin (LIPITOR) 20 MG tablet Take 1 tablet (20 mg total) by mouth daily. for cholesterol 05/27/20   Pleas Koch, NP    Allergies as of 05/10/2020 - Review Complete 05/10/2020  Allergen Reaction Noted  . Lidocaine  12/17/2019    Family History  Problem Relation Age of Onset  . Diabetes Sister     Social History   Socioeconomic History  . Marital status: Married    Spouse name: Not on file  . Number of children: Not on file  . Years of education: Not on file  . Highest education level: Not on file  Occupational History  . Not on file  Tobacco Use  . Smoking status: Former Research scientist (life sciences)  . Smokeless tobacco: Never Used  . Tobacco comment: 2000  Vaping Use  . Vaping Use: Never used  Substance and Sexual Activity  . Alcohol use: Not Currently    Alcohol/week: 7.0 standard drinks    Types: 7 Glasses of wine per week  . Drug use: Not Currently  . Sexual activity: Not Currently  Other Topics Concern  . Not on file  Social History Narrative   Married.   No  children.   Retired. Once worked for [employer].   Enjoys working on his house, playing golf, riding his bike.    Social Determinants of Health   Financial Resource Strain:   . Difficulty of Paying Living Expenses: Not on file  Food Insecurity:   . Worried About Charity fundraiser in the Last Year: Not on file  . Ran Out of Food in the Last Year: Not on file  Transportation Needs:   . Lack of Transportation (Medical): Not on file  . Lack of Transportation (Non-Medical): Not on file  Physical Activity:   . Days of Exercise per Week: Not on file  . Minutes of Exercise per Session: Not on file  Stress:   . Feeling of Stress : Not on file  Social Connections:   . Frequency of Communication with Friends and Family: Not on file  .  Frequency of Social Gatherings with Friends and Family: Not on file  . Attends Religious Services: Not on file  . Active Member of Clubs or Organizations: Not on file  . Attends Archivist Meetings: Not on file  . Marital Status: Not on file  Intimate Partner Violence:   . Fear of Current or Ex-Partner: Not on file  . Emotionally Abused: Not on file  . Physically Abused: Not on file  . Sexually Abused: Not on file    Review of Systems: See HPI, otherwise negative ROS  Physical Exam: BP (!) 175/97   Pulse (!) 48   Temp (!) 96.4 F (35.8 C) (Temporal)   Resp 20   Ht 6\' 2"  (1.88 m)   Wt 104.3 kg   SpO2 98%   BMI 29.53 kg/m  General:   Alert,  pleasant and cooperative in NAD Head:  Normocephalic and atraumatic. Neck:  Supple; no masses or thyromegaly. Lungs:  Clear throughout to auscultation, normal respiratory effort.    Heart:  +S1, +S2, Regular rate and rhythm, No edema. Abdomen:  Soft, nontender and nondistended. Normal bowel sounds, without guarding, and without rebound.   Neurologic:  Alert and  oriented x4;  grossly normal neurologically.  Impression/Plan: Curtis Black. is here for an colonoscopy to be performed for surveillance due to prior history of colon polyps   Risks, benefits, limitations, and alternatives regarding  colonoscopy have been reviewed with the patient.  Questions have been answered.  All parties agreeable.   Jonathon Bellows, MD  06/15/2020, 8:24 AM

## 2020-06-15 NOTE — Transfer of Care (Signed)
Immediate Anesthesia Transfer of Care Note  Patient: Curtis Black.  Procedure(s) Performed: COLONOSCOPY WITH PROPOFOL (N/A )  Patient Location: Endoscopy Unit  Anesthesia Type:General  Level of Consciousness: drowsy, patient cooperative and responds to stimulation  Airway & Oxygen Therapy: Patient Spontanous Breathing and Patient connected to face mask oxygen  Post-op Assessment: Report given to RN and Post -op Vital signs reviewed and stable  Post vital signs: Reviewed and stable  Last Vitals:  Vitals Value Taken Time  BP    Temp    Pulse    Resp    SpO2      Last Pain:  Vitals:   06/15/20 0758  TempSrc: Temporal  PainSc: 0-No pain         Complications: No complications documented.

## 2020-06-16 ENCOUNTER — Encounter: Payer: Self-pay | Admitting: Gastroenterology

## 2020-06-16 LAB — SURGICAL PATHOLOGY

## 2020-06-16 NOTE — Anesthesia Postprocedure Evaluation (Signed)
Anesthesia Post Note  Patient: Curtis Black.  Procedure(s) Performed: COLONOSCOPY WITH PROPOFOL (N/A )  Patient location during evaluation: PACU Anesthesia Type: General Level of consciousness: awake and alert Pain management: pain level controlled Vital Signs Assessment: post-procedure vital signs reviewed and stable Respiratory status: spontaneous breathing, nonlabored ventilation and respiratory function stable Cardiovascular status: blood pressure returned to baseline and stable Postop Assessment: no apparent nausea or vomiting Anesthetic complications: no   No complications documented.   Last Vitals:  Vitals:   06/15/20 0900 06/15/20 0910  BP: 133/88 138/88  Pulse: 60 61  Resp: 14 14  Temp:    SpO2: 98% 98%    Last Pain:  Vitals:   06/15/20 0850  TempSrc: Temporal  PainSc:                  Tera Mater

## 2020-06-26 ENCOUNTER — Encounter: Payer: Self-pay | Admitting: Gastroenterology

## 2020-07-20 ENCOUNTER — Other Ambulatory Visit: Payer: Self-pay | Admitting: Primary Care

## 2020-07-20 DIAGNOSIS — F411 Generalized anxiety disorder: Secondary | ICD-10-CM

## 2020-08-04 ENCOUNTER — Other Ambulatory Visit: Payer: Self-pay | Admitting: Primary Care

## 2020-08-04 DIAGNOSIS — E785 Hyperlipidemia, unspecified: Secondary | ICD-10-CM

## 2020-10-23 ENCOUNTER — Other Ambulatory Visit: Payer: Self-pay | Admitting: Primary Care

## 2020-10-23 DIAGNOSIS — E785 Hyperlipidemia, unspecified: Secondary | ICD-10-CM

## 2020-11-04 ENCOUNTER — Telehealth (INDEPENDENT_AMBULATORY_CARE_PROVIDER_SITE_OTHER): Payer: Self-pay | Admitting: Gastroenterology

## 2020-11-04 ENCOUNTER — Other Ambulatory Visit: Payer: Self-pay

## 2020-11-04 DIAGNOSIS — Z86010 Personal history of colonic polyps: Secondary | ICD-10-CM

## 2020-11-04 MED ORDER — PEG 3350-KCL-NA BICARB-NACL 420 G PO SOLR: 4000.0000 mL | Freq: Once | ORAL | 0 refills | Status: AC

## 2020-11-04 NOTE — Progress Notes (Signed)
Gastroenterology Pre-Procedure Review  Request Date: Tuesday 11/16/20 Requesting Physician: Dr. Vicente Males  PATIENT REVIEW QUESTIONS: The patient responded to the following health history questions as indicated:    1. Are you having any GI issues? no 2. Do you have a personal history of Polyps? yes (last colonoscopy 06/15/20 performed by Dr. Vicente Males) 3. Do you have a family history of Colon Cancer or Polyps? no 4. Diabetes Mellitus? no 5. Joint replacements in the past 12 months?no 6. Major health problems in the past 3 months?no 7. Any artificial heart valves, MVP, or defibrillator?no    MEDICATIONS & ALLERGIES:    Patient reports the following regarding taking any anticoagulation/antiplatelet therapy:   Plavix, Coumadin, Eliquis, Xarelto, Lovenox, Pradaxa, Brilinta, or Effient? no Aspirin? no  Patient confirms/reports the following medications:  Current Outpatient Medications  Medication Sig Dispense Refill  . polyethylene glycol-electrolytes (NULYTELY) 420 g solution Take 4,000 mLs by mouth once for 1 dose. Fill nulytely container to the fill line with clear liquid.  Mix well.  Drink 8 oz every 30 minutes until entire contents have been completed. 4000 mL 0  . amLODipine (NORVASC) 10 MG tablet TAKE 1 TABLET BY MOUTH EVERY DAY FOR BLOOD PRESSURE 90 tablet 1  . atorvastatin (LIPITOR) 20 MG tablet Take 1 tablet (20 mg total) by mouth daily. for cholesterol 90 tablet 3  . Coenzyme Q10 (COQ10 PO) Take 1 capsule by mouth daily.     . fenofibrate micronized (LOFIBRA) 134 MG capsule TAKE 1 CAPSULE DAILY BEFOREBREAKFAST 90 capsule 1  . GLUCOSAMINE SULFATE PO Take by mouth.    . losartan (COZAAR) 100 MG tablet TAKE 1 TABLET DAILY FOR    BLOOD PRESSURE 90 tablet 1  . Multiple Vitamin (MULTIVITAMIN) tablet Take 1 tablet by mouth daily.    . Omega-3 Fatty Acids (FISH OIL) 1000 MG CAPS Take 1,000 mg by mouth 2 (two) times daily.    . sertraline (ZOLOFT) 50 MG tablet TAKE 1 TABLET BY MOUTH EVERY DAY 90  tablet 3   No current facility-administered medications for this visit.    Patient confirms/reports the following allergies:  Allergies  Allergen Reactions  . Lidocaine     Cut off wind    Orders Placed This Encounter  Procedures  . Procedural/ Surgical Case Request: COLONOSCOPY WITH PROPOFOL    Standing Status:   Standing    Number of Occurrences:   1    Order Specific Question:   Pre-op diagnosis    Answer:   personal history of colon polyps    Order Specific Question:   CPT Code    Answer:   16109    AUTHORIZATION INFORMATION Primary Insurance: 1D#: Group #:  Secondary Insurance: 1D#: Group #:  SCHEDULE INFORMATION: Date: 11/16/20 Time: Location:[facility]

## 2020-11-05 ENCOUNTER — Other Ambulatory Visit: Payer: Self-pay

## 2020-11-05 MED ORDER — PEG 3350-KCL-NA BICARB-NACL 420 G PO SOLR: 8000.0000 mL | Freq: Once | ORAL | 0 refills | Status: AC

## 2020-11-12 ENCOUNTER — Other Ambulatory Visit
Admission: RE | Admit: 2020-11-12 | Discharge: 2020-11-12 | Disposition: A | Discharge status: Home / Self Care | Payer: Medicare Other | Source: Ambulatory Visit | Attending: Gastroenterology | Admitting: Gastroenterology

## 2020-11-12 ENCOUNTER — Other Ambulatory Visit: Payer: Self-pay

## 2020-11-12 DIAGNOSIS — Z01812 Encounter for preprocedural laboratory examination: Secondary | ICD-10-CM | POA: Insufficient documentation

## 2020-11-12 DIAGNOSIS — Z20822 Contact with and (suspected) exposure to covid-19: Secondary | ICD-10-CM | POA: Diagnosis not present

## 2020-11-13 LAB — SARS CORONAVIRUS 2 (TAT 6-24 HRS): SARS Coronavirus 2: NEGATIVE

## 2020-11-15 ENCOUNTER — Other Ambulatory Visit: Payer: Self-pay | Admitting: Primary Care

## 2020-11-15 DIAGNOSIS — H2513 Age-related nuclear cataract, bilateral: Secondary | ICD-10-CM | POA: Diagnosis not present

## 2020-11-15 DIAGNOSIS — I10 Essential (primary) hypertension: Secondary | ICD-10-CM

## 2020-11-16 ENCOUNTER — Other Ambulatory Visit: Payer: Self-pay

## 2020-11-16 ENCOUNTER — Encounter
Admission: RE | Disposition: A | Discharge status: Home / Self Care | Payer: Self-pay | Source: Home / Self Care | Attending: Gastroenterology

## 2020-11-16 ENCOUNTER — Ambulatory Visit: Payer: Medicare Other | Admitting: Anesthesiology

## 2020-11-16 ENCOUNTER — Ambulatory Visit
Admission: RE | Admit: 2020-11-16 | Discharge: 2020-11-16 | Disposition: A | Discharge status: Home / Self Care | Payer: Medicare Other | Attending: Gastroenterology | Admitting: Gastroenterology

## 2020-11-16 ENCOUNTER — Encounter: Payer: Self-pay | Admitting: Gastroenterology

## 2020-11-16 DIAGNOSIS — N183 Chronic kidney disease, stage 3 unspecified: Secondary | ICD-10-CM | POA: Insufficient documentation

## 2020-11-16 DIAGNOSIS — K5730 Diverticulosis of large intestine without perforation or abscess without bleeding: Secondary | ICD-10-CM | POA: Insufficient documentation

## 2020-11-16 DIAGNOSIS — K5790 Diverticulosis of intestine, part unspecified, without perforation or abscess without bleeding: Secondary | ICD-10-CM | POA: Diagnosis not present

## 2020-11-16 DIAGNOSIS — K635 Polyp of colon: Secondary | ICD-10-CM | POA: Diagnosis not present

## 2020-11-16 DIAGNOSIS — Z86010 Personal history of colonic polyps: Secondary | ICD-10-CM | POA: Diagnosis not present

## 2020-11-16 DIAGNOSIS — Z8546 Personal history of malignant neoplasm of prostate: Secondary | ICD-10-CM | POA: Diagnosis not present

## 2020-11-16 DIAGNOSIS — Z87891 Personal history of nicotine dependence: Secondary | ICD-10-CM | POA: Insufficient documentation

## 2020-11-16 DIAGNOSIS — D126 Benign neoplasm of colon, unspecified: Secondary | ICD-10-CM | POA: Diagnosis not present

## 2020-11-16 DIAGNOSIS — Z1211 Encounter for screening for malignant neoplasm of colon: Secondary | ICD-10-CM | POA: Insufficient documentation

## 2020-11-16 DIAGNOSIS — D122 Benign neoplasm of ascending colon: Secondary | ICD-10-CM | POA: Diagnosis not present

## 2020-11-16 DIAGNOSIS — D124 Benign neoplasm of descending colon: Secondary | ICD-10-CM | POA: Insufficient documentation

## 2020-11-16 DIAGNOSIS — I129 Hypertensive chronic kidney disease with stage 1 through stage 4 chronic kidney disease, or unspecified chronic kidney disease: Secondary | ICD-10-CM | POA: Diagnosis not present

## 2020-11-16 DIAGNOSIS — Z833 Family history of diabetes mellitus: Secondary | ICD-10-CM | POA: Diagnosis not present

## 2020-11-16 DIAGNOSIS — Z79899 Other long term (current) drug therapy: Secondary | ICD-10-CM | POA: Diagnosis not present

## 2020-11-16 HISTORY — PX: COLONOSCOPY WITH PROPOFOL: SHX5780

## 2020-11-16 SURGERY — COLONOSCOPY WITH PROPOFOL
Anesthesia: General

## 2020-11-16 MED ORDER — EPHEDRINE SULFATE 50 MG/ML IJ SOLN
INTRAMUSCULAR | Status: DC | PRN
  Administered 2020-11-16 (×2): 5 mg via INTRAVENOUS

## 2020-11-16 MED ORDER — SODIUM CHLORIDE 0.9 % IV SOLN: INTRAVENOUS | Status: DC

## 2020-11-16 MED ORDER — GLYCOPYRROLATE 0.2 MG/ML IJ SOLN
INTRAMUSCULAR | Status: DC | PRN
  Administered 2020-11-16: .2 mg via INTRAVENOUS

## 2020-11-16 MED ORDER — PROPOFOL 500 MG/50ML IV EMUL
INTRAVENOUS | Status: DC | PRN
  Administered 2020-11-16: 150 ug/kg/min via INTRAVENOUS

## 2020-11-16 MED ORDER — PROPOFOL 10 MG/ML IV BOLUS
INTRAVENOUS | Status: DC | PRN
  Administered 2020-11-16: 50 mg via INTRAVENOUS
  Administered 2020-11-16: 10 mg via INTRAVENOUS

## 2020-11-16 NOTE — Transfer of Care (Signed)
Immediate Anesthesia Transfer of Care Note  Patient: Curtis Black.  Procedure(s) Performed: COLONOSCOPY WITH PROPOFOL (N/A )  Patient Location: Endoscopy Unit  Anesthesia Type:General  Level of Consciousness: drowsy and patient cooperative  Airway & Oxygen Therapy: Patient Spontanous Breathing and Patient connected to face mask oxygen  Post-op Assessment: Report given to RN and Post -op Vital signs reviewed and stable  Post vital signs: Reviewed and stable  Last Vitals:  Vitals Value Taken Time  BP 96/54 11/16/20 1038  Temp 36.4 C 11/16/20 1035  Pulse 54 11/16/20 1041  Resp 18 11/16/20 1035  SpO2 97 % 11/16/20 1041  Vitals shown include unvalidated device data.  Last Pain:  Vitals:   11/16/20 1035  TempSrc: Temporal  PainSc: Asleep         Complications: No complications documented.

## 2020-11-16 NOTE — Anesthesia Procedure Notes (Signed)
Procedure Name: General with mask airway Performed by: Fletcher-Harrison, Yuliet Needs, CRNA Pre-anesthesia Checklist: Patient identified, Emergency Drugs available, Suction available and Patient being monitored Patient Re-evaluated:Patient Re-evaluated prior to induction Oxygen Delivery Method: Simple face mask Induction Type: IV induction Placement Confirmation: positive ETCO2 and CO2 detector Dental Injury: Teeth and Oropharynx as per pre-operative assessment        

## 2020-11-16 NOTE — Anesthesia Postprocedure Evaluation (Signed)
Anesthesia Post Note  Patient: Curtis Black.  Procedure(s) Performed: COLONOSCOPY WITH PROPOFOL (N/A )  Patient location during evaluation: PACU Anesthesia Type: General Level of consciousness: awake and alert Pain management: pain level controlled Vital Signs Assessment: post-procedure vital signs reviewed and stable Respiratory status: spontaneous breathing, nonlabored ventilation and respiratory function stable Cardiovascular status: blood pressure returned to baseline and stable Postop Assessment: no apparent nausea or vomiting Anesthetic complications: no   No complications documented.   Last Vitals:  Vitals:   11/16/20 1035 11/16/20 1045  BP: (!) 96/54 111/75  Pulse: (!) 55   Resp: 18   Temp: (!) 36.4 C   SpO2: 99%     Last Pain:  Vitals:   11/16/20 1055  TempSrc:   PainSc: 0-No pain                 Brett Canales Analyce Tavares

## 2020-11-16 NOTE — H&P (Signed)
Curtis Bellows, MD [address] [place], [place], [place], [address] [address], [place], [place], [place], [address] Phone: [phone]  Fax: [phone]  Primary Care Physician:  Pleas Koch, NP   Pre-Procedure History & Physical: HPI:  Curtis Cortright. is a 80 y.o. male is here for an colonoscopy.   Past Medical History:  Diagnosis Date  . Anxiety   . Basal cell carcinoma 03/06/2017   left medial infraorbital lat nose inf to med canthus  . Basal cell carcinoma 06/12/2019   sup to glabella  . 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  . CKD (chronic kidney disease) stage 3, GFR 30-59 ml/min (HCC)   . Essential hypertension   . Hyperlipidemia   . Prostate cancer ([place]) 2009  . Psoriasis     Past Surgical History:  Procedure Laterality Date  . APPENDECTOMY    . BOWEL RESECTION    . COLON SURGERY    . COLONOSCOPY WITH PROPOFOL N/A 06/05/2017   Procedure: COLONOSCOPY WITH PROPOFOL;  Surgeon: Curtis Bellows, MD;  [facility] ENDOSCOPY;  Service: Gastroenterology;  Laterality: N/A;  . COLONOSCOPY WITH PROPOFOL N/A 06/15/2020   Procedure: COLONOSCOPY WITH PROPOFOL;  Surgeon: Curtis Bellows, MD;  Location: [facility] ENDOSCOPY;  Service: Gastroenterology;  Laterality: N/A;  . KNEE ARTHROSCOPY Right   . PROSTATECTOMY    . thyroid biop      Prior to Admission medications   Medication Sig Start Date End Date Taking? Authorizing Provider  amLODipine (NORVASC) 10 MG tablet TAKE 1 TABLET BY MOUTH EVERY DAY FOR BLOOD PRESSURE 05/17/20  Yes Pleas Koch, NP  atorvastatin (LIPITOR) 20 MG tablet Take 1 tablet (20 mg total) by mouth daily. for cholesterol 05/27/20  Yes Pleas Koch, NP  Coenzyme Q10 (COQ10 PO) Take 1 capsule by mouth daily.    Yes [provider]  fenofibrate micronized (LOFIBRA) 134 MG capsule TAKE 1 CAPSULE DAILY BEFOREBREAKFAST 10/25/20  Yes Pleas Koch, NP  GLUCOSAMINE SULFATE  PO Take by mouth.   Yes [provider]  losartan (COZAAR) 100 MG tablet TAKE 1 TABLET DAILY FOR    BLOOD PRESSURE 05/18/20  Yes Pleas Koch, NP  Multiple Vitamin (MULTIVITAMIN) tablet Take 1 tablet by mouth daily.   Yes [provider]  Omega-3 Fatty Acids (FISH OIL) 1000 MG CAPS Take 1,000 mg by mouth 2 (two) times daily.   Yes [provider]  sertraline (ZOLOFT) 50 MG tablet TAKE 1 TABLET BY MOUTH EVERY DAY 07/21/20   Pleas Koch, NP    Allergies as of 11/04/2020 - Review Complete 11/04/2020  Allergen Reaction Noted  . Lidocaine  12/17/2019    Family History  Problem Relation Age of Onset  . Diabetes Sister     Social History   Socioeconomic History  . Marital status: Married    Spouse name: Not on file  . Number of children: Not on file  . Years of education: Not on file  . Highest education level: Not on file  Occupational History  . Not on file  Tobacco Use  . Smoking status: Former Research scientist (life sciences)  . Smokeless tobacco: Never Used  . Tobacco comment: 2000  Vaping Use  . Vaping Use: Never used  Substance and Sexual Activity  . Alcohol use: Yes    Alcohol/week: 7.0 standard drinks    Types: 7 Glasses of wine per week  . Drug use: Not  Currently  . Sexual activity: Not Currently  Other Topics Concern  . Not on file  Social History Narrative   Married.   No children.   Retired. Once worked for [employer].   Enjoys working on his house, playing golf, riding his bike.    Social Determinants of Health   Financial Resource Strain: Not on file  Food Insecurity: Not on file  Transportation Needs: Not on file  Physical Activity: Not on file  Stress: Not on file  Social Connections: Not on file  Intimate Partner Violence: Not on file    Review of Systems: See HPI, otherwise negative ROS  Physical Exam: There were no vitals taken for this visit. General:   Alert,  pleasant and cooperative in NAD Head:  Normocephalic and  atraumatic. Neck:  Supple; no masses or thyromegaly. Lungs:  Clear throughout to auscultation, normal respiratory effort.    Heart:  +S1, +S2, Regular rate and rhythm, No edema. Abdomen:  Soft, nontender and nondistended. Normal bowel sounds, without guarding, and without rebound.   Neurologic:  Alert and  oriented x4;  grossly normal neurologically.  Impression/Plan: Curtis Rankin. is here for an colonoscopy to be performed for Screening colonoscopy average risk   Risks, benefits, limitations, and alternatives regarding  colonoscopy have been reviewed with the patient.  Questions have been answered.  All parties agreeable.   Curtis Bellows, MD  11/16/2020, 9:50 AM

## 2020-11-16 NOTE — Anesthesia Preprocedure Evaluation (Addendum)
Anesthesia Evaluation  Patient identified by MRN, date of birth, ID band Patient awake    Reviewed: Allergy & Precautions, H&P , NPO status , Patient's Chart, lab work & pertinent test results  History of Anesthesia Complications Negative for: history of anesthetic complications  Airway Mallampati: II  TM Distance: >3 FB     Dental  (+) Chipped   Pulmonary neg sleep apnea, neg COPD, former smoker,    breath sounds clear to auscultation       Cardiovascular hypertension, (-) angina(-) Past MI and (-) Cardiac Stents (-) dysrhythmias  Rhythm:regular Rate:Normal     Neuro/Psych PSYCHIATRIC DISORDERS Anxiety negative neurological ROS     GI/Hepatic negative GI ROS, Neg liver ROS,   Endo/Other  negative endocrine ROS  Renal/GU Renal disease (CKD)  negative genitourinary   Musculoskeletal   Abdominal   Peds  Hematology negative hematology ROS (+)   Anesthesia Other Findings Past Medical History: No date: Anxiety 03/06/2017: Basal cell carcinoma     Comment:  left medial infraorbital lat nose inf to med canthus 06/12/2019: Basal cell carcinoma     Comment:  sup to glabella 06/12/2019: Basal cell carcinoma (BCC) of nostril     Comment:  Removed, no other intervention needed per pt. No date: CKD (chronic kidney disease) stage 3, GFR 30-59 ml/min (HCC) No date: Essential hypertension No date: Hyperlipidemia 2009: Prostate cancer ([place]) No date: Psoriasis  Past Surgical History: No date: APPENDECTOMY No date: BOWEL RESECTION No date: COLON SURGERY 06/05/2017: COLONOSCOPY WITH PROPOFOL; N/A     Comment:  Procedure: COLONOSCOPY WITH PROPOFOL;  Surgeon: Jonathon Bellows, MD;  Location: [facility] ENDOSCOPY;  Service:               Gastroenterology;  Laterality: N/A; 06/15/2020: COLONOSCOPY WITH PROPOFOL; N/A     Comment:  Procedure: COLONOSCOPY WITH PROPOFOL;  Surgeon: Jonathon Bellows, MD;  [facility]  ENDOSCOPY;  Service:               Gastroenterology;  Laterality: N/A; No date: KNEE ARTHROSCOPY; Right No date: PROSTATECTOMY No date: thyroid biop     Reproductive/Obstetrics negative OB ROS                            Anesthesia Physical Anesthesia Plan  ASA: II  Anesthesia Plan: General   Post-op Pain Management:    Induction:   PONV Risk Score and Plan: Propofol infusion and TIVA  Airway Management Planned:   Additional Equipment:   Intra-op Plan:   Post-operative Plan:   Informed Consent: I have reviewed the patients History and Physical, chart, labs and discussed the procedure including the risks, benefits and alternatives for the proposed anesthesia with the patient or authorized representative who has indicated his/her understanding and acceptance.     Dental Advisory Given  Plan Discussed with: Anesthesiologist, CRNA and Surgeon  Anesthesia Plan Comments:         Anesthesia Quick Evaluation

## 2020-11-16 NOTE — Op Note (Signed)
[facility] Gastroenterology Patient Name: Curtis Black Procedure Date: 11/16/2020 9:45 AM MRN: [id number] Account #: [id number] Date of Birth: [birth year] Admit Type: Outpatient Age: 80 Room: [facility] ENDO ROOM 2 Gender: Male Note Status: Finalized Procedure:             Colonoscopy Indications:           Screening for colorectal malignant neoplasm Providers:             Jonathon Bellows MD, MD Medicines:             Propofol per Anesthesia, Monitored Anesthesia Care Complications:         No immediate complications. Procedure:             Pre-Anesthesia Assessment:                        - Prior to the procedure, a History and Physical was                         performed, and patient medications, allergies and                         sensitivities were reviewed. The patient's tolerance                         of previous anesthesia was reviewed.                        - The risks and benefits of the procedure and the                         sedation options and risks were discussed with the                         patient. All questions were answered and informed                         consent was obtained.                        - ASA Grade Assessment: II - A patient with mild                         systemic disease.                        After obtaining informed consent, the colonoscope was                         passed under direct vision. Throughout the procedure,                         the patient's blood pressure, pulse, and oxygen                         saturations were monitored continuously. The                         Colonoscope was introduced through the anus and  advanced to the the cecum, identified by the                         appendiceal orifice. The colonoscopy was performed                         with ease. The patient tolerated the procedure well.                         The quality of the bowel preparation was  excellent.                         The quality of the bowel preparation was good. Findings:      The perianal and digital rectal examinations were normal.      A 5 mm polyp was found in the descending colon. The polyp was sessile.       The polyp was removed with a cold snare. Resection and retrieval were       complete.      Three sessile polyps were found in the ascending colon. The polyps were       4 to 6 mm in size. These polyps were removed with a cold snare.       Resection and retrieval were complete.      A 3 mm polyp was found in the ascending colon. The polyp was sessile.       The polyp was removed with a cold biopsy forceps. Resection and       retrieval were complete.      Multiple small-mouthed diverticula were found in the left colon.      The exam was otherwise without abnormality. Impression:            - One 5 mm polyp in the descending colon, removed with                         a cold snare. Resected and retrieved.                        - Three 4 to 6 mm polyps in the ascending colon,                         removed with a cold snare. Resected and retrieved.                        - One 3 mm polyp in the ascending colon, removed with                         a cold biopsy forceps. Resected and retrieved.                        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                        - The examination was otherwise normal. Recommendation:        - Discharge patient to home (with escort).                        - Resume previous diet.                        -  Continue present medications.                        - Await pathology results.                        - Repeat colonoscopy for surveillance based on                         pathology results. Procedure Code(s):     --- Professional ---                        [phone], Colonoscopy, flexible; with removal of                         tumor(s), polyp(s), or other lesion(s) by snare                          technique                        45380, 65, Colonoscopy, flexible; with biopsy, single                         or multiple Diagnosis Code(s):     --- Professional ---                        K63.5, Polyp of colon                        Z12.11, Encounter for screening for malignant neoplasm                         of colon                        K57.30, Diverticulosis of large intestine without                         perforation or abscess without bleeding CPT copyright 2019 American Medical Association. All rights reserved. The codes documented in this report are preliminary and upon coder review may  be revised to meet current compliance requirements. Jonathon Bellows, MD Jonathon Bellows MD, MD 11/16/2020 10:35:14 AM This report has been signed electronically. Number of Addenda: 0 Note Initiated On: 11/16/2020 9:45 AM Scope Withdrawal Time: 0 hours 22 minutes 10 seconds  Total Procedure Duration: 0 hours 26 minutes 6 seconds  Estimated Blood Loss:  Estimated blood loss: none.      [facility]

## 2020-11-17 ENCOUNTER — Encounter: Payer: Self-pay | Admitting: Gastroenterology

## 2020-11-17 LAB — SURGICAL PATHOLOGY

## 2020-12-09 ENCOUNTER — Ambulatory Visit (INDEPENDENT_AMBULATORY_CARE_PROVIDER_SITE_OTHER): Payer: Medicare Other | Admitting: Dermatology

## 2020-12-09 ENCOUNTER — Encounter: Payer: Self-pay | Admitting: Dermatology

## 2020-12-09 ENCOUNTER — Other Ambulatory Visit: Payer: Self-pay

## 2020-12-09 DIAGNOSIS — L578 Other skin changes due to chronic exposure to nonionizing radiation: Secondary | ICD-10-CM

## 2020-12-09 DIAGNOSIS — D229 Melanocytic nevi, unspecified: Secondary | ICD-10-CM

## 2020-12-09 DIAGNOSIS — L700 Acne vulgaris: Secondary | ICD-10-CM | POA: Diagnosis not present

## 2020-12-09 DIAGNOSIS — L570 Actinic keratosis: Secondary | ICD-10-CM | POA: Diagnosis not present

## 2020-12-09 DIAGNOSIS — L821 Other seborrheic keratosis: Secondary | ICD-10-CM

## 2020-12-09 DIAGNOSIS — L820 Inflamed seborrheic keratosis: Secondary | ICD-10-CM

## 2020-12-09 DIAGNOSIS — H2511 Age-related nuclear cataract, right eye: Secondary | ICD-10-CM | POA: Diagnosis not present

## 2020-12-09 DIAGNOSIS — L720 Epidermal cyst: Secondary | ICD-10-CM | POA: Diagnosis not present

## 2020-12-09 DIAGNOSIS — Z1283 Encounter for screening for malignant neoplasm of skin: Secondary | ICD-10-CM | POA: Diagnosis not present

## 2020-12-09 DIAGNOSIS — D1800 Hemangioma unspecified site: Secondary | ICD-10-CM | POA: Diagnosis not present

## 2020-12-09 DIAGNOSIS — Z85828 Personal history of other malignant neoplasm of skin: Secondary | ICD-10-CM | POA: Diagnosis not present

## 2020-12-09 DIAGNOSIS — L814 Other melanin hyperpigmentation: Secondary | ICD-10-CM | POA: Diagnosis not present

## 2020-12-09 NOTE — Patient Instructions (Addendum)

## 2020-12-09 NOTE — Progress Notes (Signed)
Follow-Up Visit   Subjective  Curtis Black is a 80 y.o. male who presents for the following: Total body skin exam (Hx of BCCs Nostril, L med infraorbital lat nose inf to med canthus, sup to glabella) and check spot (R sup temple, 9m, irritating). The patient presents for Total-Body Skin Exam (TBSE) for skin cancer screening and mole check.  The following portions of the chart were reviewed this encounter and updated as appropriate:   Tobacco  Allergies  Meds  Problems  Med Hx  Surg Hx  Fam Hx     Review of Systems:  No other skin or systemic complaints except as noted in HPI or Assessment and Plan.  Objective  Well appearing patient in no apparent distress; mood and affect are within normal limits.  A full examination was performed including scalp, head, eyes, ears, nose, lips, neck, chest, axillae, abdomen, back, buttocks, bilateral upper extremities, bilateral lower extremities, hands, feet, fingers, toes, fingernails, and toenails. All findings within normal limits unless otherwise noted below.  Objective  multiple: Well healed scar with no evidence of recurrence.   Objective  face: Comedones and cyst face  Objective  Left Upper Back: 1.0cm cystic pap  Objective  R temple x 1, bil arms x 15 (16): Erythematous keratotic or waxy stuck-on papule or plaque.   Objective  bil arms x 15 (15): Pink scaly macules    Assessment & Plan    Lentigines - Scattered tan macules - Due to sun exposure - Benign-appering, observe - Recommend daily broad spectrum sunscreen SPF 30+ to sun-exposed areas, reapply every 2 hours as needed. - Call for any changes  Seborrheic Keratoses - Stuck-on, waxy, tan-brown papules and/or plaques  - Benign-appearing - Discussed benign etiology and prognosis. - Observe - Call for any changes  Melanocytic Nevi - Tan-brown and/or pink-flesh-colored symmetric macules and papules - Benign appearing on exam today - Observation - Call clinic for  new or changing moles - Recommend daily use of broad spectrum spf 30+ sunscreen to sun-exposed areas.   Hemangiomas - Red papules - Discussed benign nature - Observe - Call for any changes  Actinic Damage - Chronic condition, secondary to cumulative UV/sun exposure - diffuse scaly erythematous macules with underlying dyspigmentation - Recommend daily broad spectrum sunscreen SPF 30+ to sun-exposed areas, reapply every 2 hours as needed.  - Staying in the shade or wearing long sleeves, sun glasses (UVA+UVB protection) and wide brim hats (4-inch brim around the entire circumference of the hat) are also recommended for sun protection.  - Call for new or changing lesions.  Skin cancer screening performed today.  History of basal cell carcinoma (BCC) multiple Clear. Observe for recurrence. Call clinic for new or changing lesions.  Recommend regular skin exams, daily broad-spectrum spf 30+ sunscreen use, and photoprotection.     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  Pt declines today.  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  I, [name], RMA, am acting as scribe for Sarina Ser, MD .  Documentation: I have reviewed the above documentation for accuracy and completeness, and I agree with the above.  Sarina Ser, MD

## 2020-12-28 ENCOUNTER — Encounter: Payer: Self-pay | Admitting: Ophthalmology

## 2020-12-28 ENCOUNTER — Other Ambulatory Visit: Payer: Self-pay

## 2021-01-03 NOTE — Discharge Instructions (Signed)

## 2021-01-05 ENCOUNTER — Ambulatory Visit
Admission: RE | Admit: 2021-01-05 | Discharge: 2021-01-05 | Disposition: A | Discharge status: Home / Self Care | Payer: Medicare Other | Attending: Ophthalmology | Admitting: Ophthalmology

## 2021-01-05 ENCOUNTER — Ambulatory Visit: Payer: Medicare Other | Admitting: Anesthesiology

## 2021-01-05 ENCOUNTER — Encounter: Payer: Self-pay | Admitting: Ophthalmology

## 2021-01-05 ENCOUNTER — Other Ambulatory Visit: Payer: Self-pay

## 2021-01-05 ENCOUNTER — Encounter
Admission: RE | Disposition: A | Discharge status: Home / Self Care | Payer: Self-pay | Source: Home / Self Care | Attending: Ophthalmology

## 2021-01-05 DIAGNOSIS — H2511 Age-related nuclear cataract, right eye: Secondary | ICD-10-CM | POA: Diagnosis not present

## 2021-01-05 DIAGNOSIS — I129 Hypertensive chronic kidney disease with stage 1 through stage 4 chronic kidney disease, or unspecified chronic kidney disease: Secondary | ICD-10-CM | POA: Insufficient documentation

## 2021-01-05 DIAGNOSIS — E785 Hyperlipidemia, unspecified: Secondary | ICD-10-CM | POA: Diagnosis not present

## 2021-01-05 DIAGNOSIS — Z8546 Personal history of malignant neoplasm of prostate: Secondary | ICD-10-CM | POA: Diagnosis not present

## 2021-01-05 DIAGNOSIS — H25811 Combined forms of age-related cataract, right eye: Secondary | ICD-10-CM | POA: Diagnosis not present

## 2021-01-05 DIAGNOSIS — Z9049 Acquired absence of other specified parts of digestive tract: Secondary | ICD-10-CM | POA: Insufficient documentation

## 2021-01-05 DIAGNOSIS — Z87891 Personal history of nicotine dependence: Secondary | ICD-10-CM | POA: Insufficient documentation

## 2021-01-05 DIAGNOSIS — Z9079 Acquired absence of other genital organ(s): Secondary | ICD-10-CM | POA: Diagnosis not present

## 2021-01-05 DIAGNOSIS — Z85828 Personal history of other malignant neoplasm of skin: Secondary | ICD-10-CM | POA: Diagnosis not present

## 2021-01-05 DIAGNOSIS — Z886 Allergy status to analgesic agent status: Secondary | ICD-10-CM | POA: Insufficient documentation

## 2021-01-05 DIAGNOSIS — N183 Chronic kidney disease, stage 3 unspecified: Secondary | ICD-10-CM | POA: Insufficient documentation

## 2021-01-05 HISTORY — PX: CATARACT EXTRACTION W/PHACO: SHX586

## 2021-01-05 SURGERY — PHACOEMULSIFICATION, CATARACT, WITH IOL INSERTION
Anesthesia: Monitor Anesthesia Care | Site: Eye | Laterality: Right

## 2021-01-05 MED ORDER — TETRACAINE HCL 0.5 % OP SOLN
OPHTHALMIC | Status: DC | PRN
  Administered 2021-01-05: 1 [drp] via OPHTHALMIC

## 2021-01-05 MED ORDER — OXYCODONE HCL 5 MG PO TABS: 5.0000 mg | ORAL_TABLET | Freq: Once | ORAL | Status: DC | PRN

## 2021-01-05 MED ORDER — MIDAZOLAM HCL 2 MG/2ML IJ SOLN
INTRAMUSCULAR | Status: DC | PRN
  Administered 2021-01-05: 1.5 mg via INTRAVENOUS

## 2021-01-05 MED ORDER — FENTANYL CITRATE (PF) 100 MCG/2ML IJ SOLN
INTRAMUSCULAR | Status: DC | PRN
  Administered 2021-01-05: 50 ug via INTRAVENOUS

## 2021-01-05 MED ORDER — BRIMONIDINE TARTRATE-TIMOLOL 0.2-0.5 % OP SOLN
OPHTHALMIC | Status: DC | PRN
  Administered 2021-01-05: 1 [drp] via OPHTHALMIC

## 2021-01-05 MED ORDER — LACTATED RINGERS IV SOLN: INTRAVENOUS | Status: DC

## 2021-01-05 MED ORDER — TETRACAINE HCL 0.5 % OP SOLN
1.0000 [drp] | OPHTHALMIC | Status: DC | PRN
  Administered 2021-01-05 (×3): 1 [drp] via OPHTHALMIC

## 2021-01-05 MED ORDER — NA HYALUR & NA CHOND-NA HYALUR 0.4-0.35 ML IO KIT
PACK | INTRAOCULAR | Status: DC | PRN
  Administered 2021-01-05: 1 mL via INTRAOCULAR

## 2021-01-05 MED ORDER — ARMC OPHTHALMIC DILATING DROPS
1.0000 "application " | OPHTHALMIC | Status: DC | PRN
  Administered 2021-01-05 (×3): 1 via OPHTHALMIC

## 2021-01-05 MED ORDER — OXYCODONE HCL 5 MG/5ML PO SOLN
5.0000 mg | Freq: Once | ORAL | Status: DC | PRN
Start: 2021-01-05 — End: 2021-01-05

## 2021-01-05 MED ORDER — CEFUROXIME OPHTHALMIC INJECTION 1 MG/0.1 ML
INJECTION | OPHTHALMIC | Status: DC | PRN
  Administered 2021-01-05: 0.1 mL via OPHTHALMIC

## 2021-01-05 MED ORDER — PHENYLEPHRINE-KETOROLAC 1-0.3 % IO SOLN
INTRAOCULAR | Status: DC | PRN
  Administered 2021-01-05: 58 mL via OPHTHALMIC

## 2021-01-05 SURGICAL SUPPLY — 18 items
CANNULA ANT/CHMB 27GA (MISCELLANEOUS) ×2 IMPLANT
GLOVE SURG ENC TEXT LTX SZ7.5 (GLOVE) ×2 IMPLANT
GLOVE SURG TRIUMPH 8.0 PF LTX (GLOVE) ×2 IMPLANT
GOWN STRL REUS W/ TWL LRG LVL3 (GOWN DISPOSABLE) ×2 IMPLANT
GOWN STRL REUS W/TWL LRG LVL3 (GOWN DISPOSABLE) ×4
LENS IOL TECNIS EYHANCE 19.0 (Intraocular Lens) ×2 IMPLANT
MARKER SKIN DUAL TIP RULER LAB (MISCELLANEOUS) ×2 IMPLANT
NEEDLE CAPSULORHEX 25GA (NEEDLE) ×2 IMPLANT
NEEDLE FILTER BLUNT 18X 1/2SAF (NEEDLE) ×2
NEEDLE FILTER BLUNT 18X1 1/2 (NEEDLE) ×2 IMPLANT
PACK CATARACT BRASINGTON (MISCELLANEOUS) ×2 IMPLANT
PACK EYE AFTER SURG (MISCELLANEOUS) ×2 IMPLANT
PACK OPTHALMIC (MISCELLANEOUS) ×2 IMPLANT
SOLUTION OPHTHALMIC SALT (MISCELLANEOUS) ×2 IMPLANT
SYR 3ML LL SCALE MARK (SYRINGE) ×4 IMPLANT
SYR TB 1ML LUER SLIP (SYRINGE) ×2 IMPLANT
WATER STERILE IRR 250ML POUR (IV SOLUTION) ×2 IMPLANT
WIPE NON LINTING 3.25X3.25 (MISCELLANEOUS) ×2 IMPLANT

## 2021-01-05 NOTE — Transfer of Care (Signed)
Immediate Anesthesia Transfer of Care Note  Patient: Curtis Black.  Procedure(s) Performed: CATARACT EXTRACTION PHACO AND INTRAOCULAR LENS PLACEMENT (IOC) RIGHT OMIDRIA (Right Eye)  Patient Location: PACU  Anesthesia Type: MAC  Level of Consciousness: awake, alert  and patient cooperative  Airway and Oxygen Therapy: Patient Spontanous Breathing and Patient connected to supplemental oxygen  Post-op Assessment: Post-op Vital signs reviewed, Patient's Cardiovascular Status Stable, Respiratory Function Stable, Patent Airway and No signs of Nausea or vomiting  Post-op Vital Signs: Reviewed and stable  Complications: No complications documented.

## 2021-01-05 NOTE — Anesthesia Preprocedure Evaluation (Signed)
Anesthesia Evaluation  Patient identified by MRN, date of birth, ID band Patient awake    Reviewed: NPO status   History of Anesthesia Complications Negative for: history of anesthetic complications  Airway Mallampati: II  TM Distance: >3 FB Neck ROM: full    Dental no notable dental hx.    Pulmonary neg pulmonary ROS, former smoker,    Pulmonary exam normal        Cardiovascular Exercise Tolerance: Good hypertension, Normal cardiovascular exam     Neuro/Psych Anxiety negative neurological ROS  negative psych ROS   GI/Hepatic negative GI ROS, Neg liver ROS,   Endo/Other  Morbid obesity (bmi 31)  Renal/GU Renal disease (ckd3)  negative genitourinary   Musculoskeletal  (+) Arthritis ,   Abdominal   Peds  Hematology negative hematology ROS (+) Prostate ca: 2009   Anesthesia Other Findings Hx of Bradycardia (HR down to 30's)  Reproductive/Obstetrics                             Anesthesia Physical Anesthesia Plan  ASA: II  Anesthesia Plan: MAC   Post-op Pain Management:    Induction:   PONV Risk Score and Plan: 1 and Midazolam  Airway Management Planned:   Additional Equipment:   Intra-op Plan:   Post-operative Plan:   Informed Consent: I have reviewed the patients History and Physical, chart, labs and discussed the procedure including the risks, benefits and alternatives for the proposed anesthesia with the patient or authorized representative who has indicated his/her understanding and acceptance.       Plan Discussed with: CRNA  Anesthesia Plan Comments:         Anesthesia Quick Evaluation

## 2021-01-05 NOTE — Anesthesia Procedure Notes (Signed)
Procedure Name: MAC Performed by: Keiara Sneeringer, CRNA Pre-anesthesia Checklist: Patient identified, Emergency Drugs available, Suction available, Timeout performed and Patient being monitored Patient Re-evaluated:Patient Re-evaluated prior to induction Oxygen Delivery Method: Nasal cannula Placement Confirmation: positive ETCO2       

## 2021-01-05 NOTE — Op Note (Signed)
  LOCATION:  [place]   PREOPERATIVE DIAGNOSIS:    Nuclear sclerotic cataract right eye. H25.11   POSTOPERATIVE DIAGNOSIS:  Nuclear sclerotic cataract right eye.     PROCEDURE:  Phacoemusification with posterior chamber intraocular lens placement of the right eye   ULTRASOUND TIME: Procedure(s) with comments: CATARACT EXTRACTION PHACO AND INTRAOCULAR LENS PLACEMENT (IOC) RIGHT OMIDRIA (Right) - cde 5.46 01:01.7 minutes 8.9%  LENS:   Implant Name Type Inv. Item Serial No. Manufacturer Lot No. LRB No. Used Action  LENS IOL TECNIS EYHANCE 19.0 - [id number] Intraocular Lens LENS IOL TECNIS EYHANCE 19.0 [id number] JOHNSON   Right 1 Implanted         SURGEON:  Wyonia Hough, MD   ANESTHESIA:  Topical with tetracaine drops and intracameral [place]   COMPLICATIONS:  None.   DESCRIPTION OF PROCEDURE:  The patient was identified in the holding room and transported to the operating room and placed in the supine position under the operating microscope.  The right eye was identified as the operative eye and it was prepped and draped in the usual sterile ophthalmic fashion.   A 1 millimeter clear-corneal paracentesis was made at the 12:00 position.  0.5 ml of preservative-free 1% lidocaine was injected into the anterior chamber. The anterior chamber was filled with Viscoat viscoelastic.  A 2.4 millimeter keratome was used to make a near-clear corneal incision at the 9:00 position.  A curvilinear capsulorrhexis was made with a cystotome and capsulorrhexis forceps.  Balanced salt solution was used to hydrodissect and hydrodelineate the nucleus.   Phacoemulsification was then used in stop and chop fashion to remove the lens nucleus and epinucleus.  The remaining cortex was then removed using the irrigation and aspiration handpiece. Provisc was then placed into the capsular bag to distend it for lens placement.  A lens was then injected into the capsular bag.  The remaining  viscoelastic was aspirated.   Wounds were hydrated with balanced salt solution.  The anterior chamber was inflated to a physiologic pressure with balanced salt solution.  No wound leaks were noted. Cefuroxime 0.1 ml of a 10mg /ml solution was injected into the anterior chamber for a dose of 1 mg of intracameral antibiotic at the completion of the case.   Timolol and Brimonidine drops were applied to the eye.  The patient was taken to the recovery room in stable condition without complications of anesthesia or surgery.   Aspasia Rude 01/05/2021, 12:49 PM

## 2021-01-05 NOTE — Anesthesia Postprocedure Evaluation (Signed)
Anesthesia Post Note  Patient: Curtis Black.  Procedure(s) Performed: CATARACT EXTRACTION PHACO AND INTRAOCULAR LENS PLACEMENT (IOC) RIGHT OMIDRIA (Right Eye)     Patient location during evaluation: PACU Anesthesia Type: MAC Level of consciousness: awake and alert Pain management: pain level controlled Vital Signs Assessment: post-procedure vital signs reviewed and stable Respiratory status: spontaneous breathing, nonlabored ventilation, respiratory function stable and patient connected to nasal cannula oxygen Cardiovascular status: stable and blood pressure returned to baseline Postop Assessment: no apparent nausea or vomiting Anesthetic complications: no   No complications documented.  Fidel Levy

## 2021-01-05 NOTE — H&P (Signed)
[facility]   Primary Care Physician:  Pleas Koch, NP Ophthalmologist: Dr. Leandrew Koyanagi  Pre-Procedure History & Physical: HPI:  Curtis Black. is a 80 y.o. male here for ophthalmic surgery.   Past Medical History:  Diagnosis Date  . Anxiety   . Basal cell carcinoma 03/06/2017   left medial infraorbital lat nose inf to med canthus  . Basal cell carcinoma 06/12/2019   sup to glabella  . 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  . CKD (chronic kidney disease) stage 3, GFR 30-59 ml/min (HCC)   . Essential hypertension   . Hyperlipidemia   . Prostate cancer ([place]) 2009  . Psoriasis     Past Surgical History:  Procedure Laterality Date  . APPENDECTOMY    . BOWEL RESECTION    . COLON SURGERY    . COLONOSCOPY WITH PROPOFOL N/A 06/05/2017   Procedure: COLONOSCOPY WITH PROPOFOL;  Surgeon: Jonathon Bellows, MD;  Location: [facility] ENDOSCOPY;  Service: Gastroenterology;  Laterality: N/A;  . COLONOSCOPY WITH PROPOFOL N/A 06/15/2020   Procedure: COLONOSCOPY WITH PROPOFOL;  Surgeon: Jonathon Bellows, MD;  Location: [facility] ENDOSCOPY;  Service: Gastroenterology;  Laterality: N/A;  . COLONOSCOPY WITH PROPOFOL N/A 11/16/2020   Procedure: COLONOSCOPY WITH PROPOFOL;  Surgeon: Jonathon Bellows, MD;  Location: [facility] ENDOSCOPY;  Service: Gastroenterology;  Laterality: N/A;  . KNEE ARTHROSCOPY Right   . PROSTATECTOMY    . thyroid biop      Prior to Admission medications   Medication Sig Start Date End Date Taking? Authorizing Provider  amLODipine (NORVASC) 10 MG tablet TAKE 1 TABLET BY MOUTH EVERY DAY FOR BLOOD PRESSURE 11/16/20  Yes Pleas Koch, NP  atorvastatin (LIPITOR) 20 MG tablet Take 1 tablet (20 mg total) by mouth daily. for cholesterol 05/27/20  Yes Pleas Koch, NP  Coenzyme Q10 (COQ10 PO) Take 1 capsule by mouth daily.    Yes [provider]  fenofibrate micronized (LOFIBRA) 134 MG capsule TAKE 1 CAPSULE  DAILY BEFOREBREAKFAST 10/25/20  Yes Pleas Koch, NP  GLUCOSAMINE SULFATE PO Take by mouth.   Yes [provider]  losartan (COZAAR) 100 MG tablet TAKE 1 TABLET DAILY FOR    BLOOD PRESSURE 05/18/20  Yes Pleas Koch, NP  Multiple Vitamin (MULTIVITAMIN) tablet Take 1 tablet by mouth daily.   Yes [provider]  Omega-3 Fatty Acids (FISH OIL) 1000 MG CAPS Take 1,000 mg by mouth 2 (two) times daily.   Yes [provider]  sertraline (ZOLOFT) 50 MG tablet TAKE 1 TABLET BY MOUTH EVERY DAY 07/21/20  Yes Pleas Koch, NP    Allergies as of 11/16/2020 - Review Complete 11/16/2020  Allergen Reaction Noted  . Lidocaine  12/17/2019    Family History  Problem Relation Age of Onset  . Diabetes Sister     Social History   Socioeconomic History  . Marital status: Married    Spouse name: Not on file  . Number of children: Not on file  . Years of education: Not on file  . Highest education level: Not on file  Occupational History  . Not on file  Tobacco Use  . Smoking status: Former Smoker    Types: Cigarettes    Quit date: 1998    Years since quitting: 24.3  . Smokeless tobacco: Never Used  . Tobacco comment: 2000  Vaping Use  . Vaping Use: Never used  Substance and Sexual Activity  . Alcohol  use: Yes    Alcohol/week: 7.0 standard drinks    Types: 7 Glasses of wine per week  . Drug use: Not Currently  . Sexual activity: Not Currently  Other Topics Concern  . Not on file  Social History Narrative   Married.   No children.   Retired. Once worked for [employer].   Enjoys working on his house, playing golf, riding his bike.    Social Determinants of Health   Financial Resource Strain: Not on file  Food Insecurity: Not on file  Transportation Needs: Not on file  Physical Activity: Not on file  Stress: Not on file  Social Connections: Not on file  Intimate Partner Violence: Not on file    Review of Systems: See HPI, otherwise negative  ROS  Physical Exam: BP (!) 166/87   Pulse (!) 49   Temp (!) 97.2 F (36.2 C) (Temporal)   Ht 6\' 2"  (1.88 m)   Wt 110.5 kg   SpO2 96%   BMI 31.26 kg/m  General:   Alert,  pleasant and cooperative in NAD Head:  Normocephalic and atraumatic. Lungs:  Clear to auscultation.    Heart:  Regular rate and rhythm.   Impression/Plan: Curtis Black. is here for ophthalmic surgery.  Risks, benefits, limitations, and alternatives regarding ophthalmic surgery have been reviewed with the patient.  Questions have been answered.  All parties agreeable.   Leandrew Koyanagi, MD  01/05/2021, 11:40 AM

## 2021-01-06 ENCOUNTER — Encounter: Payer: Self-pay | Admitting: Ophthalmology

## 2021-01-10 DIAGNOSIS — H2512 Age-related nuclear cataract, left eye: Secondary | ICD-10-CM | POA: Diagnosis not present

## 2021-01-12 ENCOUNTER — Encounter: Payer: Self-pay | Admitting: Ophthalmology

## 2021-01-21 NOTE — Discharge Instructions (Signed)

## 2021-01-26 ENCOUNTER — Encounter
Admission: RE | Disposition: A | Discharge status: Home / Self Care | Payer: Self-pay | Source: Home / Self Care | Attending: Ophthalmology

## 2021-01-26 ENCOUNTER — Encounter: Payer: Self-pay | Admitting: Ophthalmology

## 2021-01-26 ENCOUNTER — Ambulatory Visit: Payer: Medicare Other | Admitting: Anesthesiology

## 2021-01-26 ENCOUNTER — Ambulatory Visit
Admission: RE | Admit: 2021-01-26 | Discharge: 2021-01-26 | Disposition: A | Discharge status: Home / Self Care | Payer: Medicare Other | Attending: Ophthalmology | Admitting: Ophthalmology

## 2021-01-26 ENCOUNTER — Other Ambulatory Visit: Payer: Self-pay

## 2021-01-26 DIAGNOSIS — I129 Hypertensive chronic kidney disease with stage 1 through stage 4 chronic kidney disease, or unspecified chronic kidney disease: Secondary | ICD-10-CM | POA: Diagnosis not present

## 2021-01-26 DIAGNOSIS — Z833 Family history of diabetes mellitus: Secondary | ICD-10-CM | POA: Diagnosis not present

## 2021-01-26 DIAGNOSIS — Z8546 Personal history of malignant neoplasm of prostate: Secondary | ICD-10-CM | POA: Diagnosis not present

## 2021-01-26 DIAGNOSIS — H2512 Age-related nuclear cataract, left eye: Secondary | ICD-10-CM | POA: Diagnosis not present

## 2021-01-26 DIAGNOSIS — H25812 Combined forms of age-related cataract, left eye: Secondary | ICD-10-CM | POA: Diagnosis not present

## 2021-01-26 DIAGNOSIS — Z884 Allergy status to anesthetic agent status: Secondary | ICD-10-CM | POA: Insufficient documentation

## 2021-01-26 DIAGNOSIS — N183 Chronic kidney disease, stage 3 unspecified: Secondary | ICD-10-CM | POA: Insufficient documentation

## 2021-01-26 DIAGNOSIS — Z79899 Other long term (current) drug therapy: Secondary | ICD-10-CM | POA: Diagnosis not present

## 2021-01-26 DIAGNOSIS — Z87891 Personal history of nicotine dependence: Secondary | ICD-10-CM | POA: Diagnosis not present

## 2021-01-26 HISTORY — PX: CATARACT EXTRACTION W/PHACO: SHX586

## 2021-01-26 SURGERY — PHACOEMULSIFICATION, CATARACT, WITH IOL INSERTION
Anesthesia: Monitor Anesthesia Care | Site: Eye | Laterality: Left

## 2021-01-26 MED ORDER — MIDAZOLAM HCL 2 MG/2ML IJ SOLN
INTRAMUSCULAR | Status: DC | PRN
  Administered 2021-01-26: 1 mg via INTRAVENOUS
  Administered 2021-01-26: .5 mg via INTRAVENOUS

## 2021-01-26 MED ORDER — CEFUROXIME OPHTHALMIC INJECTION 1 MG/0.1 ML
INJECTION | OPHTHALMIC | Status: DC | PRN
  Administered 2021-01-26: 0.1 mL via INTRACAMERAL

## 2021-01-26 MED ORDER — ARMC OPHTHALMIC DILATING DROPS
1.0000 "application " | OPHTHALMIC | Status: DC | PRN
  Administered 2021-01-26 (×3): 1 via OPHTHALMIC

## 2021-01-26 MED ORDER — MOXIFLOXACIN HCL 0.5 % OP SOLN
OPHTHALMIC | Status: DC | PRN
  Administered 2021-01-26: 0.2 mL via OPHTHALMIC

## 2021-01-26 MED ORDER — FENTANYL CITRATE (PF) 100 MCG/2ML IJ SOLN
INTRAMUSCULAR | Status: DC | PRN
  Administered 2021-01-26: 50 ug via INTRAVENOUS

## 2021-01-26 MED ORDER — LACTATED RINGERS IV SOLN: INTRAVENOUS | Status: DC

## 2021-01-26 MED ORDER — BRIMONIDINE TARTRATE-TIMOLOL 0.2-0.5 % OP SOLN
OPHTHALMIC | Status: DC | PRN
  Administered 2021-01-26: 1 [drp] via OPHTHALMIC

## 2021-01-26 MED ORDER — PHENYLEPHRINE-KETOROLAC 1-0.3 % IO SOLN
INTRAOCULAR | Status: DC | PRN
  Administered 2021-01-26: 55 mL via OPHTHALMIC

## 2021-01-26 MED ORDER — NA HYALUR & NA CHOND-NA HYALUR 0.4-0.35 ML IO KIT
PACK | INTRAOCULAR | Status: DC | PRN
  Administered 2021-01-26: 1 mL via INTRAOCULAR

## 2021-01-26 MED ORDER — TETRACAINE HCL 0.5 % OP SOLN
1.0000 [drp] | OPHTHALMIC | Status: DC | PRN
  Administered 2021-01-26 (×3): 1 [drp] via OPHTHALMIC

## 2021-01-26 SURGICAL SUPPLY — 18 items
CANNULA ANT/CHMB 27GA (MISCELLANEOUS) ×2 IMPLANT
GLOVE SURG ENC TEXT LTX SZ7.5 (GLOVE) ×2 IMPLANT
GLOVE SURG TRIUMPH 8.0 PF LTX (GLOVE) ×2 IMPLANT
GOWN STRL REUS W/ TWL LRG LVL3 (GOWN DISPOSABLE) ×2 IMPLANT
GOWN STRL REUS W/TWL LRG LVL3 (GOWN DISPOSABLE) ×4
LENS IOL TECNIS EYHANCE 19.0 (Intraocular Lens) ×2 IMPLANT
MARKER SKIN DUAL TIP RULER LAB (MISCELLANEOUS) ×2 IMPLANT
NEEDLE CAPSULORHEX 25GA (NEEDLE) ×2 IMPLANT
NEEDLE FILTER BLUNT 18X 1/2SAF (NEEDLE) ×2
NEEDLE FILTER BLUNT 18X1 1/2 (NEEDLE) ×2 IMPLANT
PACK CATARACT BRASINGTON (MISCELLANEOUS) ×2 IMPLANT
PACK EYE AFTER SURG (MISCELLANEOUS) ×2 IMPLANT
PACK OPTHALMIC (MISCELLANEOUS) ×2 IMPLANT
SOLUTION OPHTHALMIC SALT (MISCELLANEOUS) ×2 IMPLANT
SYR 3ML LL SCALE MARK (SYRINGE) ×4 IMPLANT
SYR TB 1ML LUER SLIP (SYRINGE) ×2 IMPLANT
WATER STERILE IRR 250ML POUR (IV SOLUTION) ×2 IMPLANT
WIPE NON LINTING 3.25X3.25 (MISCELLANEOUS) ×2 IMPLANT

## 2021-01-26 NOTE — Transfer of Care (Signed)
Immediate Anesthesia Transfer of Care Note  Patient: Curtis Black.  Procedure(s) Performed: CATARACT EXTRACTION PHACO AND INTRAOCULAR LENS PLACEMENT (IOC) LEFT OMIDRIA 5.35 00:50.6 (Left Eye)  Patient Location: PACU  Anesthesia Type: MAC  Level of Consciousness: awake, alert  and patient cooperative  Airway and Oxygen Therapy: Patient Spontanous Breathing and Patient connected to supplemental oxygen  Post-op Assessment: Post-op Vital signs reviewed, Patient's Cardiovascular Status Stable, Respiratory Function Stable, Patent Airway and No signs of Nausea or vomiting  Post-op Vital Signs: Reviewed and stable  Complications: No complications documented.

## 2021-01-26 NOTE — Anesthesia Postprocedure Evaluation (Signed)
Anesthesia Post Note  Patient: Curtis Black.  Procedure(s) Performed: CATARACT EXTRACTION PHACO AND INTRAOCULAR LENS PLACEMENT (IOC) LEFT OMIDRIA 5.35 00:50.6 (Left Eye)     Patient location during evaluation: PACU Anesthesia Type: MAC Level of consciousness: awake and alert Pain management: pain level controlled Vital Signs Assessment: post-procedure vital signs reviewed and stable Respiratory status: spontaneous breathing, nonlabored ventilation, respiratory function stable and patient connected to nasal cannula oxygen Cardiovascular status: stable and blood pressure returned to baseline Postop Assessment: no apparent nausea or vomiting Anesthetic complications: no   No complications documented.  Sinda Du

## 2021-01-26 NOTE — H&P (Signed)
[facility]   Primary Care Physician:  Pleas Koch, NP Ophthalmologist: Dr. Leandrew Koyanagi  Pre-Procedure History & Physical: HPI:  Curtis Hulbert. is a 80 y.o. male here for ophthalmic surgery.   Past Medical History:  Diagnosis Date  . Anxiety   . Basal cell carcinoma 03/06/2017   left medial infraorbital lat nose inf to med canthus  . Basal cell carcinoma 06/12/2019   sup to glabella  . 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  . CKD (chronic kidney disease) stage 3, GFR 30-59 ml/min (HCC)   . Essential hypertension   . Hyperlipidemia   . Prostate cancer ([place]) 2009  . Psoriasis     Past Surgical History:  Procedure Laterality Date  . APPENDECTOMY    . BOWEL RESECTION    . CATARACT EXTRACTION W/PHACO Right 01/05/2021   Procedure: CATARACT EXTRACTION PHACO AND INTRAOCULAR LENS PLACEMENT ([place]) RIGHT OMIDRIA;  Surgeon: Leandrew Koyanagi, MD;  Location: [place];  Service: Ophthalmology;  Laterality: Right;  cde 5.46 01:01.7 minutes 8.9%  . COLON SURGERY    . COLONOSCOPY WITH PROPOFOL N/A 06/05/2017   Procedure: COLONOSCOPY WITH PROPOFOL;  Surgeon: Jonathon Bellows, MD;  Location: [facility] ENDOSCOPY;  Service: Gastroenterology;  Laterality: N/A;  . COLONOSCOPY WITH PROPOFOL N/A 06/15/2020   Procedure: COLONOSCOPY WITH PROPOFOL;  Surgeon: Jonathon Bellows, MD;  Location: [facility] ENDOSCOPY;  Service: Gastroenterology;  Laterality: N/A;  . COLONOSCOPY WITH PROPOFOL N/A 11/16/2020   Procedure: COLONOSCOPY WITH PROPOFOL;  Surgeon: Jonathon Bellows, MD;  Location: [facility] ENDOSCOPY;  Service: Gastroenterology;  Laterality: N/A;  . KNEE ARTHROSCOPY Right   . PROSTATECTOMY    . thyroid biop      Prior to Admission medications   Medication Sig Start Date End Date Taking? Authorizing Provider  amLODipine (NORVASC) 10 MG tablet TAKE 1 TABLET BY MOUTH EVERY DAY FOR BLOOD PRESSURE 11/16/20  Yes Pleas Koch, NP   atorvastatin (LIPITOR) 20 MG tablet Take 1 tablet (20 mg total) by mouth daily. for cholesterol 05/27/20  Yes Pleas Koch, NP  Coenzyme Q10 (COQ10 PO) Take 1 capsule by mouth daily.    Yes [provider]  fenofibrate micronized (LOFIBRA) 134 MG capsule TAKE 1 CAPSULE DAILY BEFOREBREAKFAST 10/25/20  Yes Pleas Koch, NP  GLUCOSAMINE SULFATE PO Take by mouth.   Yes [provider]  losartan (COZAAR) 100 MG tablet TAKE 1 TABLET DAILY FOR    BLOOD PRESSURE 05/18/20  Yes Pleas Koch, NP  Multiple Vitamin (MULTIVITAMIN) tablet Take 1 tablet by mouth daily.   Yes [provider]  Omega-3 Fatty Acids (FISH OIL) 1000 MG CAPS Take 1,000 mg by mouth 2 (two) times daily.   Yes [provider]  sertraline (ZOLOFT) 50 MG tablet TAKE 1 TABLET BY MOUTH EVERY DAY 07/21/20  Yes Pleas Koch, NP    Allergies as of 11/16/2020 - Review Complete 11/16/2020  Allergen Reaction Noted  . Lidocaine  12/17/2019    Family History  Problem Relation Age of Onset  . Diabetes Sister     Social History   Socioeconomic History  . Marital status: Married    Spouse name: Not on file  . Number of children: Not on file  . Years of education: Not on file  . Highest education level: Not on file  Occupational History  . Not on file  Tobacco Use  . Smoking status: Former Smoker    Types:  Cigarettes    Quit date: 1998    Years since quitting: 24.4  . Smokeless tobacco: Never Used  . Tobacco comment: 2000  Vaping Use  . Vaping Use: Never used  Substance and Sexual Activity  . Alcohol use: Yes    Alcohol/week: 7.0 standard drinks    Types: 7 Glasses of wine per week  . Drug use: Not Currently  . Sexual activity: Not Currently  Other Topics Concern  . Not on file  Social History Narrative   Married.   No children.   Retired. Once worked for [employer].   Enjoys working on his house, playing golf, riding his bike.    Social Determinants of Health    Financial Resource Strain: Not on file  Food Insecurity: Not on file  Transportation Needs: Not on file  Physical Activity: Not on file  Stress: Not on file  Social Connections: Not on file  Intimate Partner Violence: Not on file    Review of Systems: See HPI, otherwise negative ROS  Physical Exam: BP (!) 162/92   Pulse (!) 56   Temp (!) 97 F (36.1 C) (Temporal)   Resp 16   Ht 6\' 2"  (1.88 m)   Wt 110.7 kg   SpO2 95%   BMI 31.33 kg/m  General:   Alert,  pleasant and cooperative in NAD Head:  Normocephalic and atraumatic. Lungs:  Clear to auscultation.    Heart:  Regular rate and rhythm.   Impression/Plan: Curtis Rankin. is here for ophthalmic surgery.  Risks, benefits, limitations, and alternatives regarding ophthalmic surgery have been reviewed with the patient.  Questions have been answered.  All parties agreeable.   Leandrew Koyanagi, MD  01/26/2021, 1:25 PM

## 2021-01-26 NOTE — Op Note (Signed)
  OPERATIVE NOTE  Curtis Black [id number] 01/26/2021   PREOPERATIVE DIAGNOSIS:  Nuclear sclerotic cataract left eye. H25.12   POSTOPERATIVE DIAGNOSIS:    Nuclear sclerotic cataract left eye.     PROCEDURE:  Phacoemusification with posterior chamber intraocular lens placement of the left eye  Ultrasound time: Procedure(s): CATARACT EXTRACTION PHACO AND INTRAOCULAR LENS PLACEMENT (IOC) LEFT OMIDRIA 5.35 00:50.6 (Left)  LENS:   Implant Name Type Inv. Item Serial No. Manufacturer Lot No. LRB No. Used Action  LENS IOL TECNIS EYHANCE 19.0 - [id number] Intraocular Lens LENS IOL TECNIS EYHANCE 19.0 [id number] JOHNSON   Left 1 Implanted      SURGEON:  Wyonia Hough, MD   ANESTHESIA:  Topical with tetracaine drops   COMPLICATIONS:  None.   DESCRIPTION OF PROCEDURE:  The patient was identified in the holding room and transported to the operating room and placed in the supine position under the operating microscope.  The left eye was identified as the operative eye and it was prepped and draped in the usual sterile ophthalmic fashion.   A 1 millimeter clear-corneal paracentesis was made at the 1:30 position.  0.5 ml of preservative-free 1% lidocaine was injected into the anterior chamber.  The anterior chamber was filled with Viscoat viscoelastic.  A 2.4 millimeter keratome was used to make a near-clear corneal incision at the 10:30 position.  .  A curvilinear capsulorrhexis was made with a cystotome and capsulorrhexis forceps.  Balanced salt solution was used to hydrodissect and hydrodelineate the nucleus.   Phacoemulsification was then used in stop and chop fashion to remove the lens nucleus and epinucleus.  The remaining cortex was then removed using the irrigation and aspiration handpiece. Provisc was then placed into the capsular bag to distend it for lens placement.  A lens was then injected into the capsular bag.  The remaining viscoelastic was aspirated.   Wounds were  hydrated with balanced salt solution.  The anterior chamber was inflated to a physiologic pressure with balanced salt solution.  No wound leaks were noted. Cefuroxime 0.1 ml of a 10mg /ml solution was injected into the anterior chamber for a dose of 1 mg of intracameral antibiotic at the completion of the case.   Vigamox, Timolol and Brimonidine drops were applied to the eye.  The patient was taken to the recovery room in stable condition without complications of anesthesia or surgery.  [name] 01/26/2021, 2:24 PM

## 2021-01-26 NOTE — Anesthesia Preprocedure Evaluation (Signed)
Anesthesia Evaluation  Patient identified by MRN, date of birth, ID band Patient awake, Patient confused and Patient unresponsive    Reviewed: NPO status   History of Anesthesia Complications Negative for: history of anesthetic complications  Airway Mallampati: II  TM Distance: >3 FB Neck ROM: full    Dental no notable dental hx.    Pulmonary neg pulmonary ROS, former smoker,    Pulmonary exam normal breath sounds clear to auscultation       Cardiovascular Exercise Tolerance: Good hypertension, Normal cardiovascular exam Rhythm:Regular Rate:Normal     Neuro/Psych PSYCHIATRIC DISORDERS Anxiety negative neurological ROS     GI/Hepatic negative GI ROS, Neg liver ROS,   Endo/Other  Morbid obesity (bmi 31)  Renal/GU Renal disease (ckd3)  negative genitourinary   Musculoskeletal  (+) Arthritis ,   Abdominal Normal abdominal exam  (+) - obese,  Abdomen: soft.    Peds  Hematology negative hematology ROS (+) Prostate ca: 2009   Anesthesia Other Findings Hx of Bradycardia (HR down to 30's)  Reproductive/Obstetrics                             Anesthesia Physical  Anesthesia Plan  ASA: III  Anesthesia Plan: MAC   Post-op Pain Management:    Induction:   PONV Risk Score and Plan: 1 and Midazolam  Airway Management Planned: Natural Airway and Nasal Cannula  Additional Equipment:   Intra-op Plan:   Post-operative Plan:   Informed Consent: I have reviewed the patients History and Physical, chart, labs and discussed the procedure including the risks, benefits and alternatives for the proposed anesthesia with the patient or authorized representative who has indicated his/her understanding and acceptance.       Plan Discussed with: CRNA  Anesthesia Plan Comments:         Anesthesia Quick Evaluation  Patient Active Problem List   Diagnosis Date Noted  . 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 Medicare annual wellness visit, subsequent 05/15/2019  . CKD (chronic kidney disease) stage 3, GFR 30-59 ml/min (HCC) 04/23/2018  . Prediabetes 04/06/2017  . GAD (generalized anxiety disorder) 09/01/2016  . Essential hypertension 09/01/2016  . Hyperlipidemia 09/01/2016    CBC Latest Ref Rng & Units 05/25/2020 08/12/2019 05/08/2019  WBC 4.0 - 10.5 K/uL 5.1 6.2 5.9  Hemoglobin 13.0 - 17.0 g/dL 15.5 15.9 16.0  Hematocrit 39.0 - 52.0 % 45.4 46.3 46.8  Platelets 150.0 - 400.0 K/uL 251.0 267.0 267.0   BMP Latest Ref Rng & Units 05/25/2020 12/02/2019 08/12/2019  Glucose 70 - 99 mg/dL 106(H) - 116(H)  BUN 6 - 23 mg/dL 27(H) - 19  Creatinine 0.40 - 1.50 mg/dL 1.44 1.45 1.45  Sodium 135 - 145 mEq/L 141 - 141  Potassium 3.5 - 5.1 mEq/L 4.1 - 3.9  Chloride 96 - 112 mEq/L 105 - 106  CO2 19 - 32 mEq/L 29 - 29  Calcium 8.4 - 10.5 mg/dL 9.9 - 9.9    Risks and benefits of anesthesia discussed at length, patient or surrogate demonstrates understanding. Appropriately NPO. Plan to proceed with anesthesia.  Champ Mungo, MD 01/26/21

## 2021-01-26 NOTE — Anesthesia Procedure Notes (Signed)
Procedure Name: MAC Date/Time: 01/26/2021 2:08 PM Performed by: Cameron Ali, CRNA Pre-anesthesia Checklist: Patient identified, Emergency Drugs available, Suction available, Timeout performed and Patient being monitored Patient Re-evaluated:Patient Re-evaluated prior to induction Oxygen Delivery Method: Nasal cannula Placement Confirmation: positive ETCO2

## 2021-01-27 ENCOUNTER — Encounter: Payer: Self-pay | Admitting: Ophthalmology

## 2021-02-09 DIAGNOSIS — M67912 Unspecified disorder of synovium and tendon, left shoulder: Secondary | ICD-10-CM | POA: Diagnosis not present

## 2021-03-02 DIAGNOSIS — M67912 Unspecified disorder of synovium and tendon, left shoulder: Secondary | ICD-10-CM | POA: Diagnosis not present

## 2021-03-29 ENCOUNTER — Other Ambulatory Visit: Payer: Self-pay | Admitting: Primary Care

## 2021-03-29 DIAGNOSIS — E785 Hyperlipidemia, unspecified: Secondary | ICD-10-CM

## 2021-04-02 ENCOUNTER — Other Ambulatory Visit: Payer: Self-pay | Admitting: Primary Care

## 2021-04-02 DIAGNOSIS — E785 Hyperlipidemia, unspecified: Secondary | ICD-10-CM

## 2021-05-13 ENCOUNTER — Other Ambulatory Visit: Payer: Self-pay | Admitting: Primary Care

## 2021-05-13 DIAGNOSIS — I10 Essential (primary) hypertension: Secondary | ICD-10-CM

## 2021-05-14 ENCOUNTER — Other Ambulatory Visit: Payer: Self-pay | Admitting: Primary Care

## 2021-05-14 DIAGNOSIS — I10 Essential (primary) hypertension: Secondary | ICD-10-CM

## 2021-05-31 ENCOUNTER — Other Ambulatory Visit: Payer: Self-pay

## 2021-05-31 ENCOUNTER — Encounter: Payer: Self-pay | Admitting: Primary Care

## 2021-05-31 ENCOUNTER — Ambulatory Visit (INDEPENDENT_AMBULATORY_CARE_PROVIDER_SITE_OTHER): Payer: Medicare Other | Admitting: Primary Care

## 2021-05-31 VITALS — BP 134/78 | HR 47 | Temp 97.2°F | Ht 74.0 in | Wt 237.0 lb

## 2021-05-31 DIAGNOSIS — R001 Bradycardia, unspecified: Secondary | ICD-10-CM | POA: Insufficient documentation

## 2021-05-31 DIAGNOSIS — Z125 Encounter for screening for malignant neoplasm of prostate: Secondary | ICD-10-CM | POA: Diagnosis not present

## 2021-05-31 DIAGNOSIS — I10 Essential (primary) hypertension: Secondary | ICD-10-CM

## 2021-05-31 DIAGNOSIS — E785 Hyperlipidemia, unspecified: Secondary | ICD-10-CM

## 2021-05-31 DIAGNOSIS — Z0000 Encounter for general adult medical examination without abnormal findings: Secondary | ICD-10-CM

## 2021-05-31 DIAGNOSIS — R7303 Prediabetes: Secondary | ICD-10-CM

## 2021-05-31 DIAGNOSIS — N183 Chronic kidney disease, stage 3 unspecified: Secondary | ICD-10-CM

## 2021-05-31 DIAGNOSIS — F411 Generalized anxiety disorder: Secondary | ICD-10-CM | POA: Diagnosis not present

## 2021-05-31 LAB — COMPREHENSIVE METABOLIC PANEL
ALT: 21 U/L (ref 0–53)
AST: 26 U/L (ref 0–37)
Albumin: 4.8 g/dL (ref 3.5–5.2)
Alkaline Phosphatase: 45 U/L (ref 39–117)
BUN: 26 mg/dL — ABNORMAL HIGH (ref 6–23)
CO2: 30 mEq/L (ref 19–32)
Calcium: 10.2 mg/dL (ref 8.4–10.5)
Chloride: 104 mEq/L (ref 96–112)
Creatinine, Ser: 1.52 mg/dL — ABNORMAL HIGH (ref 0.40–1.50)
GFR: 43.69 mL/min — ABNORMAL LOW (ref >60.00)
Glucose, Bld: 100 mg/dL — ABNORMAL HIGH (ref 70–99)
Potassium: 5 mEq/L (ref 3.5–5.1)
Sodium: 141 mEq/L (ref 135–145)
Total Bilirubin: 0.7 mg/dL (ref 0.2–1.2)
Total Protein: 7.4 g/dL (ref 6.0–8.3)

## 2021-05-31 LAB — CBC
HCT: 47.8 % (ref 39.0–52.0)
Hemoglobin: 16.1 g/dL (ref 13.0–17.0)
MCHC: 33.8 g/dL (ref 30.0–36.0)
MCV: 90.5 fl (ref 78.0–100.0)
Platelets: 234 10*3/uL (ref 150.0–400.0)
RBC: 5.27 Mil/uL (ref 4.22–5.81)
RDW: 13.4 % (ref 11.5–15.5)
WBC: 5.2 10*3/uL (ref 4.0–10.5)

## 2021-05-31 LAB — LIPID PANEL
Cholesterol: 180 mg/dL (ref 0–200)
HDL: 47 mg/dL (ref >39.00)
LDL Cholesterol: 105 mg/dL — ABNORMAL HIGH (ref 0–99)
NonHDL: 133.27
Total CHOL/HDL Ratio: 4
Triglycerides: 143 mg/dL (ref 0.0–149.0)
VLDL: 28.6 mg/dL (ref 0.0–40.0)

## 2021-05-31 LAB — TSH: TSH: 2.76 u[IU]/mL (ref 0.35–5.50)

## 2021-05-31 LAB — HEMOGLOBIN A1C: Hgb A1c MFr Bld: 5.8 % (ref 4.6–6.5)

## 2021-05-31 LAB — PSA, MEDICARE: PSA: 0 ng/ml — ABNORMAL LOW (ref 0.10–4.00)

## 2021-05-31 NOTE — Patient Instructions (Signed)
Stop by the lab prior to leaving today. I will notify you of your results once received.   It was a pleasure to see you today!  

## 2021-05-31 NOTE — Assessment & Plan Note (Signed)
Immunizations UTD, will get influenza vaccine later this week. PSA due and pending. Colonoscopy UTD. Discussed the importance of a healthy diet and regular exercise in order for weight loss, and to reduce the risk of further co-morbidity.  Exam today stable. Labs pending.  I have personally reviewed and have noted: 1. The patient's medical and social history 2. Their use of alcohol, tobacco or illicit drugs 3. Their current medications and supplements 4. The patient's functional ability including ADL's, fall risks, home safety risks and  hearing or visual impairment. 5. Diet and physical activities 6. Evidence for depression or mood disorder

## 2021-05-31 NOTE — Assessment & Plan Note (Signed)
Chronic for years ranging 40's-60's. Asymptomatic. No family history of heart block or need of pacemaker.  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  He is not on beta blocker.   Continue to monitor.

## 2021-05-31 NOTE — Assessment & Plan Note (Signed)
Doing well on sertraline 50 mg, continue same.

## 2021-05-31 NOTE — Assessment & Plan Note (Signed)
Managed on ARB. Continue same. Repeat renal function pending.

## 2021-05-31 NOTE — Progress Notes (Signed)
Subjective:    Patient ID: Curtis Black., male    DOB: 28-Nov-1940, 80 y.o.   MRN: [id number]  HPI  Curtis Black. is a very pleasant 80 y.o. male who presents today for medicare Wellness visit and follow up of chronic conditions.   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   CKD (chronic kidney disease) stage 3, GFR 30-59 ml/min (HCC)    Essential hypertension    Hyperlipidemia    Prostate cancer ([place]) 2009   Psoriasis     Current Outpatient Medications  Medication Sig Dispense Refill   amLODipine (NORVASC) 10 MG tablet Take 1 tablet (10 mg total) by mouth daily. For blood pressure. Office visit required for further refills. 90 tablet 0   atorvastatin (LIPITOR) 20 MG tablet TAKE 1 TABLET DAILY FOR    CHOLESTEROL 30 tablet 0   Coenzyme Q10 (COQ10 PO) Take 1 capsule by mouth daily.      fenofibrate micronized (LOFIBRA) 134 MG capsule TAKE 1 CAPSULE DAILY BEFORE BREAKFAST for cholesterol. 90 capsule 0   GLUCOSAMINE SULFATE PO Take by mouth.     losartan (COZAAR) 100 MG tablet Take 1 tablet (100 mg total) by mouth daily. For blood pressure. Office visit required for further refills. 90 tablet 0   Multiple Vitamin (MULTIVITAMIN) tablet Take 1 tablet by mouth daily.     Omega-3 Fatty Acids (FISH OIL) 1000 MG CAPS Take 1,000 mg by mouth 2 (two) times daily.     sertraline (ZOLOFT) 50 MG tablet TAKE 1 TABLET BY MOUTH EVERY DAY 90 tablet 3   No current facility-administered medications for this visit.    Allergies  Allergen Reactions   Lidocaine     Cut off wind    Family History  Problem Relation Age of Onset   Diabetes Sister     Social History   Socioeconomic History   Marital status: Married    Spouse name:  Not on file   Number of children: Not on file   Years of education: Not on file   Highest education level: Not on file  Occupational History   Not on file  Tobacco Use   Smoking status: Former    Types: Cigarettes    Quit date: 1998    Years since quitting: 24.7   Smokeless tobacco: Never   Tobacco comments:    2000  Vaping Use   Vaping Use: Never used  Substance and Sexual Activity   Alcohol use: Yes    Alcohol/week: 7.0 standard drinks    Types: 7 Glasses of wine per week   Drug use: Not Currently   Sexual activity: Not Currently  Other Topics Concern   Not on file  Social History Narrative   Married.   No children.   Retired. Once worked for [employer].   Enjoys working on his house, playing golf, riding his bike.    Social Determinants of Health   Financial Resource Strain: Not on file  Food Insecurity: Not on file  Transportation Needs: Not on file  Physical Activity: Not on file  Stress: Not on file  Social Connections: Not on file  Intimate Partner Violence: Not on file  Hospitiliaztions: None  Health Maintenance:    Flu: Declines   Tetanus: 2019  Pneumovax: 2020  Prevnar: 2018  Zostavax: 2016, Shingrix in 2019  Colonoscopy: 2022, due again 3 years   Eye Doctor: Annually   Dental Exam: Semi-annually   PSA: Due  Hep C Screening: Negative    Providers: Alma Friendly, PCP; [facility], Dermatology    I have personally reviewed and have noted: 1. The patient's medical and social history 2. Their use of alcohol, tobacco or illicit drugs 3. Their current medications and supplements 4. The patient's functional ability including ADL's, fall risks, home safety risks and  hearing or visual impairment. 5. Diet and physical activities 6. Evidence for depression or mood disorder  Subjective:   Review of Systems:   Constitutional: Denies fever, malaise, fatigue, headache or abrupt weight changes.  HEENT: Denies eye pain, eye redness, ear pain, ringing  in the ears, wax buildup, runny nose, nasal congestion, bloody nose, or sore throat. Respiratory: Denies difficulty breathing, shortness of breath, cough or sputum production.   Cardiovascular: Denies chest pain, chest tightness, palpitations or swelling in the hands or feet.  Gastrointestinal: Denies abdominal pain, bloating, constipation, diarrhea or blood in the stool.  GU: Denies urgency, frequency, pain with urination, burning sensation, blood in urine, odor or discharge. Musculoskeletal: Denies decrease in range of motion, difficulty with gait, muscle pain or joint pain and swelling.  Skin: Denies redness, rashes, lesions or ulcercations.  Neurological: Denies dizziness, difficulty with memory, difficulty with speech or problems with balance and coordination.  Psychiatric: Denies concerns for anxiety or depression.   No other specific complaints in a complete review of systems (except as listed in HPI above).  Objective:  PE:   BP 134/78   Pulse (!) 47   Temp (!) 97.2 F (36.2 C) (Temporal)   Ht 6\' 2"  (1.88 m)   Wt 237 lb (107.5 kg)   SpO2 98%   BMI 30.43 kg/m  Wt Readings from Last 3 Encounters:  05/31/21 237 lb (107.5 kg)  01/26/21 244 lb (110.7 kg)  01/05/21 243 lb 8 oz (110.5 kg)    General: Appears their stated age, well developed, well nourished in NAD. Skin: Warm, dry and intact. No rashes, lesions or ulcerations noted. HEENT: Head: normal shape and size; Eyes: sclera white, no icterus, conjunctiva pink, PERRLA and EOMs intact; Ears: Tm's gray and intact, normal light reflex; Nose: mucosa pink and moist, septum midline; Throat/Mouth: Teeth present, mucosa pink and moist, no exudate, lesions or ulcerations noted.  Neck: Normal range of motion. Neck supple, trachea midline. No massses, lumps or thyromegaly present.  Cardiovascular: Normal rate and rhythm. S1,S2 noted.  No murmur, rubs or gallops noted. No JVD or BLE edema. No carotid bruits noted. Pulmonary/Chest:  Normal effort and positive vesicular breath sounds. No respiratory distress. No wheezes, rales or ronchi noted.  Abdomen: Soft and nontender. Normal bowel sounds, no bruits noted. No distention or masses noted. Liver, spleen and kidneys non palpable. Musculoskeletal: Normal range of motion. No signs of joint swelling. No difficulty with gait.  Neurological: Alert and oriented. Cranial nerves II-XII intact. Coordination normal. +DTRs bilaterally. Psychiatric: Mood and affect normal. Behavior is normal. Judgment and thought content normal.   EKG: Reviewed from 2017, Sinus bradycardia with rate of 45.  BMET    Component Value Date/Time   NA 141 05/25/2020 0803   K 4.1 05/25/2020 0803   CL 105 05/25/2020 0803   CO2 29 05/25/2020 0803   GLUCOSE 106 (  H) 05/25/2020 0803   BUN 27 (H) 05/25/2020 0803   CREATININE 1.44 05/25/2020 0803   CALCIUM 9.9 05/25/2020 0803    Lipid Panel     Component Value Date/Time   CHOL 160 05/25/2020 0803   TRIG 144.0 05/25/2020 0803   HDL 37.80 (L) 05/25/2020 0803   CHOLHDL 4 05/25/2020 0803   VLDL 28.8 05/25/2020 0803   LDLCALC 94 05/25/2020 0803    CBC    Component Value Date/Time   WBC 5.1 05/25/2020 0803   RBC 5.04 05/25/2020 0803   HGB 15.5 05/25/2020 0803   HCT 45.4 05/25/2020 0803   PLT 251.0 05/25/2020 0803   MCV 90.1 05/25/2020 0803   MCHC 34.0 05/25/2020 0803   RDW 13.3 05/25/2020 0803   LYMPHSABS 2.2 08/12/2019 0751   MONOABS 0.7 08/12/2019 0751   EOSABS 0.1 08/12/2019 0751   BASOSABS 0.0 08/12/2019 0751    Hgb A1C Lab Results  Component Value Date   HGBA1C 5.9 05/25/2020      Assessment and Plan:   Medicare Annual Wellness Visit:  Diet: Fair diet Physical activity: Regular golfing, some walking.  Depression/mood screen: Negative Hearing: Intact to whispered voice Visual acuity: Grossly normal, performs annual eye exam  ADLs: Capable Fall risk: None Home safety: Good Cognitive evaluation: Intact to orientation,  naming, recall and repetition EOL planning: Adv directives.   Preventative Medicine: Immunizations UTD, will get influenza vaccine later this week. PSA due and pending. Colonoscopy UTD. Discussed the importance of a healthy diet and regular exercise in order for weight loss, and to reduce the risk of further co-morbidity.  Exam today stable. Labs pending.  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   CKD (chronic kidney disease) stage 3, GFR 30-59 ml/min (HCC)    Essential hypertension    Hyperlipidemia    Prostate cancer ([place]) 2009   Psoriasis     Social History   Socioeconomic History   Marital status: Married    Spouse name: Not on file   Number of children: Not on file   Years of education: Not on file   Highest education level: Not on file  Occupational History   Not on file  Tobacco Use   Smoking status: Former    Types: Cigarettes    Quit date: 1998    Years since quitting: 24.7   Smokeless tobacco: Never   Tobacco comments:    2000  Vaping Use   Vaping Use: Never used  Substance and Sexual Activity   Alcohol use: Yes    Alcohol/week: 7.0 standard drinks    Types: 7 Glasses of wine per week   Drug use: Not Currently   Sexual activity: Not Currently  Other Topics Concern   Not on file  Social History Narrative   Married.   No children.   Retired. Once worked for [employer].   Enjoys working on his house, playing golf, riding his bike.    Social Determinants of Health   Financial Resource Strain: Not on file  Food Insecurity: Not on file  Transportation Needs: Not on file  Physical Activity: Not  on file  Stress: Not on file  Social  Connections: Not on file  Intimate Partner Violence: Not on file    Past Surgical History:  Procedure Laterality Date   APPENDECTOMY     BOWEL RESECTION     CATARACT EXTRACTION W/PHACO Right 01/05/2021   Procedure: CATARACT EXTRACTION PHACO AND INTRAOCULAR LENS PLACEMENT ([place]) RIGHT OMIDRIA;  Surgeon: Leandrew Koyanagi, MD;  Location: [place];  Service: Ophthalmology;  Laterality: Right;  cde 5.46 01:01.7 minutes 8.9%   CATARACT EXTRACTION W/PHACO Left 01/26/2021   Procedure: CATARACT EXTRACTION PHACO AND INTRAOCULAR LENS PLACEMENT ([place]) LEFT OMIDRIA 5.35 00:50.6;  Surgeon: Leandrew Koyanagi, MD;  Location: [place];  Service: Ophthalmology;  Laterality: Left;   COLON SURGERY     COLONOSCOPY WITH PROPOFOL N/A 06/05/2017   Procedure: COLONOSCOPY WITH PROPOFOL;  Surgeon: Jonathon Bellows, MD;  Location: [facility] ENDOSCOPY;  Service: Gastroenterology;  Laterality: N/A;   COLONOSCOPY WITH PROPOFOL N/A 06/15/2020   Procedure: COLONOSCOPY WITH PROPOFOL;  Surgeon: Jonathon Bellows, MD;  Location: [facility] ENDOSCOPY;  Service: Gastroenterology;  Laterality: N/A;   COLONOSCOPY WITH PROPOFOL N/A 11/16/2020   Procedure: COLONOSCOPY WITH PROPOFOL;  Surgeon: Jonathon Bellows, MD;  Location: [facility] ENDOSCOPY;  Service: Gastroenterology;  Laterality: N/A;   KNEE ARTHROSCOPY Right    PROSTATECTOMY     thyroid biop      Family History  Problem Relation Age of Onset   Diabetes Sister     Allergies  Allergen Reactions   Lidocaine     Cut off wind    Current Outpatient Medications on File Prior to Visit  Medication Sig Dispense Refill   amLODipine (NORVASC) 10 MG tablet Take 1 tablet (10 mg total) by mouth daily. For blood pressure. Office visit required for further refills. 90 tablet 0   atorvastatin (LIPITOR) 20 MG tablet TAKE 1 TABLET DAILY FOR    CHOLESTEROL 30 tablet 0   Coenzyme Q10 (COQ10 PO) Take 1 capsule by mouth daily.      fenofibrate micronized (LOFIBRA) 134 MG capsule TAKE 1  CAPSULE DAILY BEFORE BREAKFAST for cholesterol. 90 capsule 0   GLUCOSAMINE SULFATE PO Take by mouth.     losartan (COZAAR) 100 MG tablet Take 1 tablet (100 mg total) by mouth daily. For blood pressure. Office visit required for further refills. 90 tablet 0   Multiple Vitamin (MULTIVITAMIN) tablet Take 1 tablet by mouth daily.     Omega-3 Fatty Acids (FISH OIL) 1000 MG CAPS Take 1,000 mg by mouth 2 (two) times daily.     sertraline (ZOLOFT) 50 MG tablet TAKE 1 TABLET BY MOUTH EVERY DAY 90 tablet 3   No current facility-administered medications on file prior to visit.    BP 134/78   Pulse (!) 47   Temp (!) 97.2 F (36.2 C) (Temporal)   Ht 6\' 2"  (1.88 m)   Wt 237 lb (107.5 kg)   SpO2 98%   BMI 30.43 kg/m  Objective:   Physical Exam        Assessment & Plan:      This visit occurred during the SARS-CoV-2 public health emergency.  Safety protocols were in place, including screening questions prior to the visit, additional usage of staff PPE, and extensive cleaning of exam room while observing appropriate contact time as indicated for disinfecting solutions.

## 2021-05-31 NOTE — Assessment & Plan Note (Signed)
Controlled in the office today.  Continue amlodipine 10 mg, losartan 100 mg.   Continue same.  Labs pending.

## 2021-05-31 NOTE — Assessment & Plan Note (Signed)
Discussed the importance of a healthy diet and regular exercise in order for weight loss, and to reduce the risk of further co-morbidity. ? ?Repeat A1C pending. ?

## 2021-05-31 NOTE — Assessment & Plan Note (Signed)
Compliant to atorvastatin 20 mg and fenofibrate 134 mg, continue same.  Repeat lipid panel pending.

## 2021-06-01 DIAGNOSIS — N183 Chronic kidney disease, stage 3 unspecified: Secondary | ICD-10-CM

## 2021-06-15 ENCOUNTER — Other Ambulatory Visit: Payer: Self-pay

## 2021-06-15 ENCOUNTER — Ambulatory Visit
Admission: RE | Admit: 2021-06-15 | Discharge: 2021-06-15 | Disposition: A | Discharge status: Home / Self Care | Payer: Medicare Other | Source: Ambulatory Visit | Attending: Primary Care | Admitting: Primary Care

## 2021-06-15 DIAGNOSIS — N281 Cyst of kidney, acquired: Secondary | ICD-10-CM | POA: Diagnosis not present

## 2021-06-15 DIAGNOSIS — N183 Chronic kidney disease, stage 3 unspecified: Secondary | ICD-10-CM | POA: Diagnosis not present

## 2021-06-15 DIAGNOSIS — N189 Chronic kidney disease, unspecified: Secondary | ICD-10-CM | POA: Diagnosis not present

## 2021-06-17 ENCOUNTER — Other Ambulatory Visit: Payer: Self-pay | Admitting: Primary Care

## 2021-06-17 DIAGNOSIS — N183 Chronic kidney disease, stage 3 unspecified: Secondary | ICD-10-CM

## 2021-07-19 ENCOUNTER — Other Ambulatory Visit: Payer: Self-pay | Admitting: Primary Care

## 2021-07-19 DIAGNOSIS — I10 Essential (primary) hypertension: Secondary | ICD-10-CM

## 2021-07-19 DIAGNOSIS — E785 Hyperlipidemia, unspecified: Secondary | ICD-10-CM

## 2021-07-29 ENCOUNTER — Ambulatory Visit (HOSPITAL_BASED_OUTPATIENT_CLINIC_OR_DEPARTMENT_OTHER)
Admission: RE | Admit: 2021-07-29 | Discharge: 2021-07-29 | Disposition: A | Discharge status: Home / Self Care | Payer: Medicare Other | Source: Ambulatory Visit | Attending: Primary Care | Admitting: Primary Care

## 2021-07-29 ENCOUNTER — Other Ambulatory Visit: Payer: Self-pay

## 2021-07-29 ENCOUNTER — Ambulatory Visit (INDEPENDENT_AMBULATORY_CARE_PROVIDER_SITE_OTHER): Payer: Medicare Other | Admitting: Primary Care

## 2021-07-29 ENCOUNTER — Encounter: Payer: Self-pay | Admitting: Primary Care

## 2021-07-29 ENCOUNTER — Ambulatory Visit: Payer: Medicare Other

## 2021-07-29 VITALS — BP 152/86 | HR 49 | Temp 97.0°F | Wt 246.4 lb

## 2021-07-29 DIAGNOSIS — N281 Cyst of kidney, acquired: Secondary | ICD-10-CM | POA: Diagnosis not present

## 2021-07-29 DIAGNOSIS — R310 Gross hematuria: Secondary | ICD-10-CM | POA: Diagnosis not present

## 2021-07-29 DIAGNOSIS — N202 Calculus of kidney with calculus of ureter: Secondary | ICD-10-CM | POA: Diagnosis not present

## 2021-07-29 DIAGNOSIS — K5730 Diverticulosis of large intestine without perforation or abscess without bleeding: Secondary | ICD-10-CM | POA: Diagnosis not present

## 2021-07-29 HISTORY — DX: Gross hematuria: R31.0

## 2021-07-29 LAB — POC URINALSYSI DIPSTICK (AUTOMATED)
Bilirubin, UA: NEGATIVE
Blood, UA: 200
Glucose, UA: NEGATIVE
Ketones, UA: NEGATIVE
Nitrite, UA: NEGATIVE
Protein, UA: POSITIVE — AB
Spec Grav, UA: >=1.03 — AB (ref 1.010–1.025)
Urobilinogen, UA: 0.2 E.U./dL
pH, UA: 5.5 (ref 5.0–8.0)

## 2021-07-29 LAB — URINALYSIS, MICROSCOPIC ONLY

## 2021-07-29 MED ORDER — TAMSULOSIN HCL 0.4 MG PO CAPS: 0.4000 mg | ORAL_CAPSULE | Freq: Every day | ORAL | 0 refills | Status: DC

## 2021-07-29 NOTE — Patient Instructions (Addendum)
Curtis Black will call you shortly regarding where to go for the CT scan.  I will be in touch later today.  Start tamsulosin 0.4 mg once daily for urine flow and kidney stone.  Increase water intake.  Go to the hospital if this gets worse.  It was a pleasure to see you today!

## 2021-07-29 NOTE — Progress Notes (Signed)
Subjective:    Patient ID: Curtis Rankin., male    DOB: 1941-05-14, 80 y.o.   MRN: [id number]  HPI  Curtis Eppinger. is a very pleasant 80 y.o. male with a history of hypertension, CKD stage III, prediabetes, hyperlipidemia, tobacco abuse (quit in 1998), prostate cancer who presents today to discuss gross hematuria.  Hematuria began four days ago that he noticed when urinating in the afternoon. Since then he's noticed daily gross hematuria, clearer in the mornings, gradually darkens in the afternoon and evening. Towards the evening his color is like "coca cola".   He's also noticed non radiating right groin pain that began around the same time, urinary urgency and some gas.   He denies fevers, difficulty initiating his stream, foul smell to urine. Prostate was removed years ago due to prostate cancer.   He underwent renal US in October 2022 which revealed "probable non obstructing right renal calculi".   BP Readings from Last 3 Encounters:  07/29/21 (!) 152/86  05/31/21 134/78  01/26/21 (!) 141/95      Review of Systems  Constitutional:  Negative for fever.  Gastrointestinal:  Negative for abdominal pain.  Genitourinary:  Positive for hematuria and urgency. Negative for dysuria, flank pain and frequency.       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   CKD (chronic kidney disease) stage 3, GFR 30-59 ml/min (HCC)    Essential hypertension    Hyperlipidemia    Prostate cancer ([place]) 2009   Psoriasis     Social History   Socioeconomic History   Marital status: Married    Spouse name: Not on file   Number of children: Not on file   Years of education:  Not on file   Highest education level: Not on file  Occupational History   Not on file  Tobacco Use   Smoking status: Former    Types: Cigarettes    Quit date: 1998    Years since quitting: 24.9   Smokeless tobacco: Never   Tobacco comments:    2000  Vaping Use   Vaping Use: Never used  Substance and Sexual Activity   Alcohol use: Yes    Alcohol/week: 7.0 standard drinks    Types: 7 Glasses of wine per week   Drug use: Not Currently   Sexual activity: Not Currently  Other Topics Concern   Not on file  Social History Narrative   Married.   No children.   Retired. Once worked for [employer].   Enjoys working on his house, playing golf, riding his bike.    Social Determinants of Health   Financial Resource Strain: Not on file  Food Insecurity: Not on file  Transportation Needs: Not on file  Physical Activity: Not on file  Stress: Not on file  Social Connections: Not on file  Intimate Partner Violence: Not on file    Past Surgical History:  Procedure Laterality Date   APPENDECTOMY     BOWEL RESECTION     CATARACT EXTRACTION W/PHACO Right 01/05/2021   Procedure: CATARACT EXTRACTION PHACO AND INTRAOCULAR LENS PLACEMENT ([place]) [place];  Surgeon: Leandrew Koyanagi, MD;  Location: [place];  Service: Ophthalmology;  Laterality: Right;  cde 5.46 01:01.7 minutes 8.9%   CATARACT EXTRACTION W/PHACO Left 01/26/2021   Procedure: CATARACT EXTRACTION PHACO AND INTRAOCULAR LENS PLACEMENT ([place]) LEFT OMIDRIA 5.35 00:50.6;  Surgeon: Leandrew Koyanagi, MD;  Location: [place];  Service: Ophthalmology;  Laterality: Left;   COLON SURGERY     COLONOSCOPY WITH PROPOFOL N/A 06/05/2017   Procedure: COLONOSCOPY WITH PROPOFOL;  Surgeon: Jonathon Bellows, MD;  Location: [facility] ENDOSCOPY;  Service: Gastroenterology;  Laterality: N/A;   COLONOSCOPY WITH PROPOFOL N/A 06/15/2020   Procedure: COLONOSCOPY WITH PROPOFOL;  Surgeon: Jonathon Bellows, MD;  Location: [facility] ENDOSCOPY;   Service: Gastroenterology;  Laterality: N/A;   COLONOSCOPY WITH PROPOFOL N/A 11/16/2020   Procedure: COLONOSCOPY WITH PROPOFOL;  Surgeon: Jonathon Bellows, MD;  Location: [facility] ENDOSCOPY;  Service: Gastroenterology;  Laterality: N/A;   KNEE ARTHROSCOPY Right    PROSTATECTOMY     thyroid biop      Family History  Problem Relation Age of Onset   Diabetes Sister     Allergies  Allergen Reactions   Lidocaine     Cut off wind    Current Outpatient Medications on File Prior to Visit  Medication Sig Dispense Refill   amLODipine (NORVASC) 10 MG tablet Take 1 tablet (10 mg total) by mouth daily. For blood pressure. Office visit required for further refills. 90 tablet 0   atorvastatin (LIPITOR) 20 MG tablet TAKE 1 TABLET DAILY FOR    CHOLESTEROL 90 tablet 2   Coenzyme Q10 (COQ10 PO) Take 1 capsule by mouth daily.      fenofibrate micronized (LOFIBRA) 134 MG capsule TAKE 1 CAPSULE DAILY BEFOREBREAKFAST FOR CHOLESTEROL 90 capsule 2   GLUCOSAMINE SULFATE PO Take by mouth.     losartan (COZAAR) 100 MG tablet TAKE 1 TABLET DAILY FOR    BLOOD PRESSURE 90 tablet 2   Multiple Vitamin (MULTIVITAMIN) tablet Take 1 tablet by mouth daily.     Omega-3 Fatty Acids (FISH OIL) 1000 MG CAPS Take 1,000 mg by mouth 2 (two) times daily.     sertraline (ZOLOFT) 50 MG tablet TAKE 1 TABLET BY MOUTH EVERY DAY 90 tablet 3   No current facility-administered medications on file prior to visit.    BP (!) 152/86 (BP Location: Left Arm, Patient Position: Sitting, Cuff Size: Normal)   Pulse (!) 49   Temp (!) 97 F (36.1 C) (Temporal)   Wt 246 lb 7 oz (111.8 kg)   SpO2 97%   BMI 31.64 kg/m  Objective:   Physical Exam Constitutional:      General: He is not in acute distress.    Appearance: He is not ill-appearing.  Cardiovascular:     Rate and Rhythm: Normal rate and regular rhythm.  Pulmonary:     Effort: Pulmonary effort is normal.     Breath sounds: Normal breath sounds. No wheezing or rales.  Abdominal:      Tenderness: There is no right CVA tenderness or left CVA tenderness.       Comments: Tenderness to right suprapubic region  Musculoskeletal:     Cervical back: Neck supple.  Skin:    General: Skin is warm and dry.  Neurological:     Mental Status: He is alert and oriented to person, place, and time.          Assessment & Plan:      This visit occurred during the SARS-CoV-2 public health emergency.  Safety protocols  were in place, including screening questions prior to the visit, additional usage of staff PPE, and extensive cleaning of exam room while observing appropriate contact time as indicated for disinfecting solutions.

## 2021-07-29 NOTE — Assessment & Plan Note (Signed)
Acute x 4 days.  Differentials include renal stone, UTI.  Need to rule out cancerous causes given medical history.  Reviewed renal US from October 2022. STAT renal stone study ordered and pending.  He appears very stable for outpatient treatment. Is in no distress.  Rx for Flomax 0.4 mg sent to pharmacy for potential renal stone.  Strict ED precautions provided.

## 2021-07-29 NOTE — Addendum Note (Signed)
Addended by: Virl Cagey on: 07/29/2021 03:24 PM   Modules accepted: Orders

## 2021-07-30 LAB — URINE CULTURE
MICRO NUMBER:: 12707592
Result:: NO GROWTH
SPECIMEN QUALITY:: ADEQUATE

## 2021-07-31 NOTE — Telephone Encounter (Signed)
Curtis Black :)

## 2021-08-02 ENCOUNTER — Ambulatory Visit: Payer: Medicare Other | Admitting: Primary Care

## 2021-08-02 NOTE — Telephone Encounter (Signed)
Called patient states that he is feeling better. He has some left over hydrocodone 5-325 from past surgery he has taken two and helped with pain.

## 2021-08-08 ENCOUNTER — Other Ambulatory Visit: Payer: Self-pay | Admitting: Primary Care

## 2021-08-08 DIAGNOSIS — I10 Essential (primary) hypertension: Secondary | ICD-10-CM

## 2021-08-08 NOTE — Telephone Encounter (Signed)
LAST APPOINTMENT DATE: 05/31/21 CPE and f/u HTN    NEXT APPOINTMENT DATE: Visit date not found    LAST REFILL: 05/13/2021 not that needs refill    QTY: 90 with 0 r/f

## 2021-08-26 ENCOUNTER — Other Ambulatory Visit: Payer: Self-pay | Admitting: Primary Care

## 2021-08-26 DIAGNOSIS — R310 Gross hematuria: Secondary | ICD-10-CM

## 2021-09-09 DIAGNOSIS — Z961 Presence of intraocular lens: Secondary | ICD-10-CM | POA: Diagnosis not present

## 2021-12-15 ENCOUNTER — Ambulatory Visit (INDEPENDENT_AMBULATORY_CARE_PROVIDER_SITE_OTHER): Payer: Medicare Other | Admitting: Dermatology

## 2021-12-15 DIAGNOSIS — Z1283 Encounter for screening for malignant neoplasm of skin: Secondary | ICD-10-CM

## 2021-12-15 DIAGNOSIS — Z85828 Personal history of other malignant neoplasm of skin: Secondary | ICD-10-CM | POA: Diagnosis not present

## 2021-12-15 DIAGNOSIS — L578 Other skin changes due to chronic exposure to nonionizing radiation: Secondary | ICD-10-CM | POA: Diagnosis not present

## 2021-12-15 DIAGNOSIS — L720 Epidermal cyst: Secondary | ICD-10-CM | POA: Diagnosis not present

## 2021-12-15 DIAGNOSIS — C44319 Basal cell carcinoma of skin of other parts of face: Secondary | ICD-10-CM | POA: Diagnosis not present

## 2021-12-15 DIAGNOSIS — D1800 Hemangioma unspecified site: Secondary | ICD-10-CM | POA: Diagnosis not present

## 2021-12-15 DIAGNOSIS — D229 Melanocytic nevi, unspecified: Secondary | ICD-10-CM

## 2021-12-15 DIAGNOSIS — L409 Psoriasis, unspecified: Secondary | ICD-10-CM

## 2021-12-15 DIAGNOSIS — L814 Other melanin hyperpigmentation: Secondary | ICD-10-CM

## 2021-12-15 DIAGNOSIS — L820 Inflamed seborrheic keratosis: Secondary | ICD-10-CM | POA: Diagnosis not present

## 2021-12-15 DIAGNOSIS — D489 Neoplasm of uncertain behavior, unspecified: Secondary | ICD-10-CM

## 2021-12-15 DIAGNOSIS — L821 Other seborrheic keratosis: Secondary | ICD-10-CM

## 2021-12-15 MED ORDER — TRIAMCINOLONE ACETONIDE 0.1 % EX CREA: 1.0000 "application " | TOPICAL_CREAM | CUTANEOUS | 2 refills | Status: DC

## 2021-12-15 NOTE — Progress Notes (Signed)
? ?Follow-Up Visit ?  ?Subjective  ?Curtis Black is a 81 y.o. male who presents for the following: Annual Exam (Hx BCC, AK's  patient reports a couple spots at chest, forehead and psoriasis using cerave cream and wash at elbows and arms and right hip). The patient presents for Total-Body Skin Exam (TBSE) for skin cancer screening and mole check.  The patient has spots, moles and lesions to be evaluated, some may be new or changing.  ? ?The following portions of the chart were reviewed this encounter and updated as appropriate:  ? Tobacco  Allergies  Meds  Problems  Med Hx  Surg Hx  Fam Hx   ?  ?Review of Systems:  No other skin or systemic complaints except as noted in HPI or Assessment and Plan. ? ?Objective  ?Well appearing patient in no apparent distress; mood and affect are within normal limits. ? ?A full examination was performed including scalp, head, eyes, ears, nose, lips, neck, chest, axillae, abdomen, back, buttocks, bilateral upper extremities, bilateral lower extremities, hands, feet, fingers, toes, fingernails, and toenails. All findings within normal limits unless otherwise noted below. ? ?left chest x 2 ?Erythematous stuck-on, waxy papule or plaque ? ?Left Upper Back ?1.0cm cystic pap ? ?Trunk, extremities ?Guttate areas on arms and plaque on right buttock  ? ?superior to glabella ?1.2 cm pink patch  ? ? ? ? ? ? ? ?Assessment & Plan  ?Inflamed seborrheic keratosis ?left chest x 2 ?Destruction of lesion - left chest x 2 ?Complexity: simple   ?Destruction method: cryotherapy   ?Informed consent: discussed and consent obtained   ?Timeout:  patient name, date of birth, surgical site, and procedure verified ?Lesion destroyed using liquid nitrogen: Yes   ?Region frozen until ice ball extended beyond lesion: Yes   ?Outcome: patient tolerated procedure well with no complications   ?Post-procedure details: wound care instructions given   ? ?Epidermoid cyst of skin ?Left Upper Back ?Benign-appearing. Exam most  consistent with an epidermal inclusion cyst. Discussed that a cyst is a benign growth that can grow over time and sometimes get irritated or inflamed. Recommend observation if it is not bothersome. Discussed option of surgical excision to remove it if it is growing, symptomatic, or other changes noted. Please call for new or changing lesions so they can be evaluated. ? ?Psoriasis ?Trunk, extremities ?Psoriasis is a chronic non-curable, but treatable genetic/hereditary disease that may have other systemic features affecting other organ systems such as joints (Psoriatic Arthritis). It is associated with an increased risk of inflammatory bowel disease, heart disease, non-alcoholic fatty liver disease, and depression.   ? ?Start TMC 0.1 cream bid 5d/wk prn until clear. Topical steroids (such as triamcinolone, fluocinolone, fluocinonide, mometasone, clobetasol, halobetasol, betamethasone, hydrocortisone) can cause thinning and lightening of the skin if they are used for too long in the same area. Your physician has selected the right strength medicine for your problem and area affected on the body. Please use your medication only as directed by your physician to prevent side effects.  ? ?triamcinolone cream (KENALOG) 0.1 % - Trunk, extremities ?Apply 1 application. topically See admin instructions. Use twice daily for  5 days weekly as needed to affected areas of body for psoriasis. Avoid applying to face, groin, and axilla. Use as directed. ? ?Neoplasm of uncertain behavior -rule out recurrent BCC ?superior to glabella ?Skin / nail biopsy ?Type of biopsy: tangential   ?Informed consent: discussed and consent obtained   ?Timeout: patient name, date of birth, surgical  site, and procedure verified   ?Procedure prep:  Patient was prepped and draped in usual sterile fashion ?Prep type:  Isopropyl alcohol ?Anesthesia: the lesion was anesthetized in a standard fashion   ?Anesthetic:  1% lidocaine w/ epinephrine 1-100,000  buffered w/ 8.4% NaHCO3 ?Instrument used: flexible razor blade   ?Hemostasis achieved with: pressure, aluminum chloride and electrodesiccation   ?Outcome: patient tolerated procedure well   ?Post-procedure details: sterile dressing applied and wound care instructions given   ?Dressing type: petrolatum and bandage   ? ?Specimen 1 - Surgical pathology ?Differential Diagnosis: D48.5 r/o recurrent BCC ?[id number]  ?Check Margins: No  ? ?Will recommend Mohs if positive results and send to Dr. Lacinda Axon  ? ?Skin cancer screening ? ?Lentigines ?- Scattered tan macules ?- Due to sun exposure ?- Benign-appearing, observe ?- Recommend daily broad spectrum sunscreen SPF 30+ to sun-exposed areas, reapply every 2 hours as needed. ?- Call for any changes ? ?Seborrheic Keratoses ?- Stuck-on, waxy, tan-brown papules and/or plaques  ?- Benign-appearing ?- Discussed benign etiology and prognosis. ?- Observe ?- Call for any changes ? ?Melanocytic Nevi ?- Tan-brown and/or pink-flesh-colored symmetric macules and papules ?- Benign appearing on exam today ?- Observation ?- Call clinic for new or changing moles ?- Recommend daily use of broad spectrum spf 30+ sunscreen to sun-exposed areas.  ? ?Hemangiomas ?- Red papules ?- Discussed benign nature ?- Observe ?- Call for any changes ? 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  ?- Staying in the shade or wearing long sleeves, sun glasses (UVA+UVB protection) and wide brim hats (4-inch brim around the entire circumference of the hat) are also recommended for sun protection.  ?- Call for new or changing lesions. ? ?History of Basal Cell Carcinoma of the Skin ?- No evidence of recurrence today ?- Recommend regular full body skin exams ?- Recommend daily broad spectrum sunscreen SPF 30+ to  sun-exposed areas, reapply every 2 hours as needed.  ?- Call if any new or changing lesions are noted between office visits ? ?Skin cancer screening performed today. ? ?Return for 3 month psoriasis , 1 year tbse . ? ?IRudell Cobb, CMA, am acting as scribe for Sarina Ser, MD . ?Documentation: I have reviewed the above documentation for accuracy and completeness, and I agree with the above. ? ?Sarina Ser, MD ? ?

## 2021-12-15 NOTE — Patient Instructions (Addendum)
Biopsy Wound Care Instructions ? ?Leave the original bandage on for 24 hours if possible.  If the bandage becomes soaked or soiled before that time, it is OK to remove it and examine the wound.  A small amount of post-operative bleeding is normal.  If excessive bleeding occurs, remove the bandage, place gauze over the site and apply continuous pressure (no peeking) over the area for 30 minutes. If this does not work, please call our clinic as soon as possible or page your doctor if it is after hours.  ? ?Once a day, cleanse the wound with soap and water. It is fine to shower. If a thick crust develops you may use a Q-tip dipped into dilute hydrogen peroxide (mix 1:1 with water) to dissolve it.  Hydrogen peroxide can slow the healing process, so use it only as needed.   ? ?After washing, apply petroleum jelly (Vaseline) or an antibiotic ointment if your doctor prescribed one for you, followed by a bandage.   ? ?For best healing, the wound should be covered with a layer of ointment at all times. If you are not able to keep the area covered with a bandage to hold the ointment in place, this may mean re-applying the ointment several times a day.  Continue this wound care until the wound has healed and is no longer open.  ? ?Itching and mild discomfort is normal during the healing process. However, if you develop pain or severe itching, please call our office.  ? ?If you have any discomfort, you can take Tylenol (acetaminophen) or ibuprofen as directed on the bottle. (Please do not take these if you have an allergy to them or cannot take them for another reason). ? ?Some redness, tenderness and white or yellow material in the wound is normal healing.  If the area becomes very sore and red, or develops a thick yellow-green material (pus), it may be infected; please notify us.   ? ?If you have stitches, return to clinic as directed to have the stitches removed. You will continue wound care for 2-3 days after the stitches  are removed.  ? ?Wound healing continues for up to one year following surgery. It is not unusual to experience pain in the scar from time to time during the interval.  If the pain becomes severe or the scar thickens, you should notify the office.   ? ?A slight amount of redness in a scar is expected for the first six months.  After six months, the redness will fade and the scar will soften and fade.  The color difference becomes less noticeable with time.  If there are any problems, return for a post-op surgery check at your earliest convenience. ? ?To improve the appearance of the scar, you can use silicone scar gel, cream, or sheets (such as Mederma or Serica) every night for up to one year. These are available over the counter (without a prescription). ? ?Please call our office at [phone] for any questions or concerns. ? ? ? ? ? ?If You Need Anything After Your Visit ? ?If you have any questions or concerns for your doctor, please call our main line at [phone] and press option 4 to reach your doctor's medical assistant. If no one answers, please leave a voicemail as directed and we will return your call as soon as possible. Messages left after 4 pm will be answered the following business day.  ? ?You may also send [place] a message via MyChart. We typically  respond to MyChart messages within 1-2 business days. ? ?For prescription refills, please ask your pharmacy to contact our office. Our fax number is [phone]. ? ?If you have an urgent issue when the clinic is closed that cannot wait until the next business day, you can page your doctor at the number below.   ? ?Please note that while we do our best to be available for urgent issues outside of office hours, we are not available 24/7.  ? ?If you have an urgent issue and are unable to reach [place], you may choose to seek medical care at your doctor's office, retail clinic, urgent care center, or emergency room. ? ?If you have a medical emergency, please  immediately call 911 or go to the emergency department. ? ?Pager Numbers ? ?- Dr. Nehemiah Massed: [phone] ? ?- Dr. Laurence Ferrari: [phone] ? ?- Dr. Nicole Kindred: [phone] ? ?In the event of inclement weather, please call our main line at [phone] for an update on the status of any delays or closures. ? ?Dermatology Medication Tips: ?Please keep the boxes that topical medications come in in order to help keep track of the instructions about where and how to use these. Pharmacies typically print the medication instructions only on the boxes and not directly on the medication tubes.  ? ?If your medication is too expensive, please contact our office at [phone] option 4 or send [place] a message through [place].  ? ?We are unable to tell what your co-pay for medications will be in advance as this is different depending on your insurance coverage. However, we may be able to find a substitute medication at lower cost or fill out paperwork to get insurance to cover a needed medication.  ? ?If a prior authorization is required to get your medication covered by your insurance company, please allow [place] 1-2 business days to complete this process. ? ?Drug prices often vary depending on where the prescription is filled and some pharmacies may offer cheaper prices. ? ?The website www.goodrx.com contains coupons for medications through different pharmacies. The prices here do not account for what the cost may be with help from insurance (it may be cheaper with your insurance), but the website can give you the price if you did not use any insurance.  ?- You can print the associated coupon and take it with your prescription to the pharmacy.  ?- You may also stop by our office during regular business hours and pick up a GoodRx coupon card.  ?- If you need your prescription sent electronically to a different pharmacy, notify our office through [facility] or by phone at [phone] option 4. ? ? ? ? ?Si Usted Necesita Algo Despu?s  de Su Visita ? ?Tambi?n puede enviarnos un mensaje a trav?s de MyChart. Por lo general respondemos a los mensajes de MyChart en el transcurso de 1 a 2 d?as h?biles. ? ?Para renovar recetas, por favor pida a su farmacia que se ponga en contacto con nuestra oficina. Nuestro n?mero de fax es el [phone]. ? ?Si tiene un asunto urgente cuando la cl?nica est? cerrada y que no puede esperar hasta el siguiente d?a h?bil, puede llamar/localizar a su doctor(a) al n?mero que aparece a continuaci?n.  ? ?Por favor, tenga en cuenta que aunque hacemos todo lo posible para estar disponibles para asuntos urgentes fuera del horario de oficina, no estamos disponibles las 24 horas del d?a, los 7 d?as de la semana.  ? ?Si tiene un problema urgente y no puede comunicarse con  nosotros, puede optar por buscar atenci?n m?dica  en el consultorio de su doctor(a), en una cl?nica privada, en un centro de atenci?n urgente o en una sala de emergencias. ? ?Si tiene Engineer, maintenance (IT) m?dica, por favor llame inmediatamente al 911 o vaya a la sala de emergencias. ? ?N?meros de b?per ? ?- Dr. Nehemiah Massed: [phone] ? ?- Dra. Moye: [phone] ? ?- Dra. Nicole Kindred: [phone] ? ?En caso de inclemencias del tiempo, por favor llame a nuestra l?nea principal al [phone] para una actualizaci?n sobre el estado de cualquier retraso o cierre. ? ?Consejos para la medicaci?n en dermatolog?a: ?Por favor, guarde las cajas en las que vienen los medicamentos de uso t?pico para ayudarle a seguir las instrucciones sobre d?nde y c?mo usarlos. Las farmacias generalmente imprimen las instrucciones del medicamento s?lo en las cajas y no directamente en los tubos del [place].  ? ?Si su medicamento es muy caro, por favor, p?ngase en contacto con Zigmund Daniel llamando al [phone] y presione la opci?n 4 o env?enos un mensaje a trav?s de MyChart.  ? ?No podemos decirle cu?l ser? su copago por los medicamentos por adelantado ya que esto es diferente dependiendo  de la cobertura de su seguro. Sin embargo, es posible que podamos encontrar un medicamento sustituto a Electrical engineer un formulario para que el seguro cubra el medicamento que se Restaurant manager, fast food

## 2021-12-20 ENCOUNTER — Telehealth: Payer: Self-pay

## 2021-12-20 DIAGNOSIS — C44319 Basal cell carcinoma of skin of other parts of face: Secondary | ICD-10-CM

## 2021-12-20 NOTE — Telephone Encounter (Signed)
-----   Message from Ralene Bathe, MD sent at 12/16/2021  5:42 PM EDT ----- ?Diagnosis ?Skin , superior to glabella ?BASAL CELL CARCINOMA, NODULAR AND INFILTRATIVE PATTERNS, BASE INVOLVED ? ?Recurrent cancer  / basal cell carcinoma ?Schedule for Mohs ?

## 2021-12-20 NOTE — Telephone Encounter (Signed)
Called pt discussed biopsy results confirmed BCC we will refer pt to Mohs at [facility] with Dr Lacinda Axon ? ? ?Referral sent to Dr Lacinda Axon  ?

## 2021-12-25 ENCOUNTER — Encounter: Payer: Self-pay | Admitting: Dermatology

## 2021-12-26 NOTE — Telephone Encounter (Signed)
[place] [facility] - Client ?TELEPHONE ADVICE RECORD ?AccessNurse? ?Patient ?Name: ?Curtis TA ?Black ?Gender: Male ?DOB: 30-Mar-1941 ?Age: 81 Y 31 M 9 D ?Return ?Phone ?Number: ?[id number] ?(Primary) ?Address: ?City/ ?State/ ?Zip: ?[place] ?[address] ?Client [place] [facility] - Client ?Client Site [place] - Day ?Provider Alma Friendly - NP ?Contact Type Call ?Who Is Calling Patient / Member / Family / Caregiver ?Call Type Triage / Clinical ?Relationship To Patient Self ?Return Phone Number [phone] (Primary) ?Chief Complaint Leg Swelling And Edema ?Reason for Call Symptomatic / Request for Health Information ?Initial Comment Caller is experiencing right leg pain. His right foot ?and leg are swollen. ?Translation No ?Nurse Assessment ?Nurse: Velta Addison, RN, [place] Date/Time (Eastern Time): 12/26/2021 1:41:03 PM ?Confirm and document reason for call. If ?symptomatic, describe symptoms. ?---Caller is experiencing right leg pain. His right foot is ?swollen. States it has a burning or hot sensation when ?it is swelling. Symptoms started about a week ago and ?has been bad since it started. Unable to sleep,cannot ?walk well. No fever ?Does the patient have any new or worsening ?symptoms? ---Yes ?Will a triage be completed? ---Yes ?Related visit to physician within the last 2 weeks? ---No ?Does the PT have any chronic conditions? (i.e. ?diabetes, asthma, this includes High risk factors for ?pregnancy, etc.) ?---Yes ?List chronic conditions. ---HTN ?Is this a behavioral health or substance abuse call? ---No ?Guidelines ?Guideline Title Affirmed Question Affirmed Notes Nurse Date/Time (Eastern ?Time) ?Leg Swelling and ?Edema ?[1] Can't walk or can ?barely walk AND [2] ?new-onset ?Parrott, RN, [place] 12/26/2021 1:42:51 PM ?Disp. Time (Eastern ?Time) Disposition Final User ?12/26/2021 1:46:27 PM Go to ED Now Yes Parrott, RN, [place] ?PLEASE NOTE: All timestamps  contained within this report are represented as [place] Standard Time. ?CONFIDENTIALTY NOTICE: This fax transmission is intended only for the addressee. It contains information that is legally privileged, confidential or ?otherwise protected from use or disclosure. If you are not the intended recipient, you are strictly prohibited from reviewing, disclosing, copying using ?or disseminating any of this information or taking any action in reliance on or regarding this information. If you have received this fax in error, please ?notify us immediately by telephone so that we can arrange for its return to [place]. Phone: [phone], Toll-Free: [phone], Fax: [phone] ?Page: 2 of 2 ?Call Id: [id number] ?Caller Disagree/Comply Comply ?Caller Understands Yes ?PreDisposition Call Doctor ?Care Advice Given Per Guideline ?GO TO ED NOW: * You need to be seen in the Emergency Department. * Go to the ED at ___________ [place] now. ?Drive carefully. CARE ADVICE given per Leg Swelling and Edema (Adult) guideline. * If the patient cannot walk at all because ?of significant pain or leg weakness, then the patient will need to be transported via ambulance and examined at the emergency ?department. NOTE TO TRIAGER - AMBULANCE TRANSPORT IF UNABLE TO WALK: ?Referrals ?[place]

## 2021-12-26 NOTE — Telephone Encounter (Signed)
I spoke with pt; pt said for about 1 wk has had pain from rt hip to rt ankle. Pt said also has swelling,redness and pain in rt foot. Pt said the pain in calf of rt leg is more of a crampy pain and the pain in rt hip is about a pain level of 5. No CP or SOB. Pt said the pain was worse last wk. Pt spoke with access nurse and was advised to go to ED. Pt did not want to go to ED. Pt only wants to see Gentry Fitz NP. Anda Kraft had cancellation on 12/27/21 at 7:20 and pt will be at [facility] on 12/27/21 at 7:05 am. UC & ED precautions given and pt voiced understanding.sending note to Gentry Fitz NP who is out of office today and Romilda Garret NP who is in office and Anastasiya CMA.will teams anastasiya also. ?

## 2021-12-26 NOTE — Telephone Encounter (Signed)
Noted. Thanks.

## 2021-12-27 ENCOUNTER — Ambulatory Visit (INDEPENDENT_AMBULATORY_CARE_PROVIDER_SITE_OTHER): Payer: Medicare Other | Admitting: Primary Care

## 2021-12-27 ENCOUNTER — Encounter: Payer: Self-pay | Admitting: Primary Care

## 2021-12-27 VITALS — BP 140/62 | HR 50 | Temp 98.6°F | Ht 74.0 in | Wt 250.0 lb

## 2021-12-27 DIAGNOSIS — M5441 Lumbago with sciatica, right side: Secondary | ICD-10-CM

## 2021-12-27 DIAGNOSIS — G8929 Other chronic pain: Secondary | ICD-10-CM | POA: Insufficient documentation

## 2021-12-27 DIAGNOSIS — I10 Essential (primary) hypertension: Secondary | ICD-10-CM | POA: Diagnosis not present

## 2021-12-27 MED ORDER — PREDNISONE 20 MG PO TABS: ORAL_TABLET | ORAL | 0 refills | Status: DC

## 2021-12-27 MED ORDER — TIZANIDINE HCL 4 MG PO TABS: 4.0000 mg | ORAL_TABLET | Freq: Every evening | ORAL | 0 refills | Status: DC | PRN

## 2021-12-27 NOTE — Assessment & Plan Note (Signed)
Mild on exam today.  Suspect right pedal edema to be secondary to amlodipine side effects.  No evidence of DVT on exam today. ? ?We discussed reducing his amlodipine to 5 mg, monitoring his blood pressure, and updating regarding his swelling. ?

## 2021-12-27 NOTE — Patient Instructions (Signed)
Start prednisone 20 mg tablets. Take 2 tablets by mouth once daily in the morning for 5 days. ? ?You may take tizanidine muscle relaxer at bedtime as needed for muscle spasms.  ? ?Try cutting your amlodipine blood pressure pill in half to see if this helps to reduce your foot swelling. Monitor your blood pressure as discussed. ? ?It was a pleasure to see you today! ? ? ?

## 2021-12-27 NOTE — Assessment & Plan Note (Signed)
No alarm signs on exam today. ?Symptoms appear to be provoked from riding his recumbent bicycle. ? ?Prescription for prednisone 40 mg daily x5 days sent to pharmacy.  Prescription for tizanidine 4 mg at bedtime as needed sent to pharmacy. ? ?We discussed stretching, walking. ?Consider imaging/PT if no improvement.  ?

## 2021-12-27 NOTE — Progress Notes (Signed)
? ?Subjective:  ? ? Patient ID: Curtis Rankin., male    DOB: 1940-12-10, 81 y.o.   MRN: [id number] ? ?HPI ? ?Curtis Rankin. is a very pleasant 81 y.o. male with a history of hypertension, CKD, prediabetes, hyperlipidemia who presents today to discuss back pain and right pedal edema.  ? ?His back pain is located to the right lower back with radiation down to his hip and extension to his right calf at times. His pain began about one week ago. He has noticed pain to the left lower back, not as much as the right. He's noticed intermittent numbness down his right lower extremity. Prior to his back pain he rode his recumbent bicycle for which he had not used for about 3 years. He suspects this provoked his symptoms.  ? ?About one month ago he noticed right dorsal foot swelling, sometimes to the ankle. He is managed on amlodipine 10 mg daily. He's active during his day, balanced with walking and resting. He checks his BP at home which runs 130's/60's-70's.  ? ?He denies calf swelling, redness, warmth. He denies falls. He's taken Aleve, OxyContin, Vicodin, Motrin, used hot and cold compresses and Icy hot with temporary improvement.  ? ?BP Readings from Last 3 Encounters:  ?12/27/21 140/62  ?07/29/21 (!) 152/86  ?05/31/21 134/78  ? ? ? ? ?Review of Systems  ?Musculoskeletal:  Positive for back pain.  ?Skin:  Negative for color change.  ?Neurological:  Positive for numbness. Negative for weakness.  ? ?   ? ? ?Past Medical History:  ?Diagnosis Date  ? Anxiety   ? Basal cell carcinoma 03/06/2017  ? left medial infraorbital lat nose inf to med canthus  ? Basal cell carcinoma 06/12/2019  ? sup to glabella  ? Basal cell carcinoma 12/15/2021  ? superior to glabella, schedule Mohs  ? 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 Removed, no other intervention needed per pt.  ? CKD (chronic kidney disease) stage 3, GFR 30-59 ml/min (HCC)   ? Essential hypertension   ? Hyperlipidemia   ? Prostate cancer [facility]) 2009  ?  Psoriasis   ? ? ?Social History  ? ?Socioeconomic History  ? Marital status: Married  ?  Spouse name: Not on file  ? Number of children: Not on file  ? Years of education: Not on file  ? Highest education level: Not on file  ?Occupational History  ? Not on file  ?Tobacco Use  ? Smoking status: Former  ?  Types: Cigarettes  ?  Quit date: 1998  ?  Years since quitting: 25.3  ? Smokeless tobacco: Never  ? Tobacco comments:  ?  2000  ?Vaping Use  ? Vaping Use: Never used  ?Substance and Sexual Activity  ? Alcohol use: Yes  ?  Alcohol/week: 7.0 standard drinks  ?  Types: 7 Glasses of wine per week  ? Drug use: Not Currently  ? Sexual activity: Not Currently  ?Other Topics Concern  ? Not on file  ?Social History Narrative  ? Married.  ? No children.  ? Retired. Once worked for [employer].  ? Enjoys working on his house, playing golf, riding his bike.   ? ?Social Determinants of Health  ? ?Financial Resource Strain: Not on file  ?Food Insecurity: Not on file  ?Transportation Needs: Not on file  ?Physical Activity: Not on file  ?Stress: Not on file  ?Social Connections: Not on file  ?Intimate Partner Violence: Not on file  ? ? ?  Past Surgical History:  ?Procedure Laterality Date  ? APPENDECTOMY    ? BOWEL RESECTION    ? CATARACT EXTRACTION W/PHACO Right 01/05/2021  ? Procedure: CATARACT EXTRACTION PHACO AND INTRAOCULAR LENS PLACEMENT ([place]) RIGHT OMIDRIA;  Surgeon: Leandrew Koyanagi, MD;  Location: [place];  Service: Ophthalmology;  Laterality: Right;  cde 5.46 ?01:01.7 minutes ?8.9%  ? CATARACT EXTRACTION W/PHACO Left 01/26/2021  ? Procedure: CATARACT EXTRACTION PHACO AND INTRAOCULAR LENS PLACEMENT ([place]) LEFT OMIDRIA 5.35 00:50.6;  Surgeon: Leandrew Koyanagi, MD;  Location: [facility];  Service: Ophthalmology;  Laterality: Left;  ? COLON SURGERY    ? COLONOSCOPY WITH PROPOFOL N/A 06/05/2017  ? Procedure: COLONOSCOPY WITH PROPOFOL;  Surgeon: Jonathon Bellows, MD;  Location: [facility] ENDOSCOPY;  Service:  Gastroenterology;  Laterality: N/A;  ? COLONOSCOPY WITH PROPOFOL N/A 06/15/2020  ? Procedure: COLONOSCOPY WITH PROPOFOL;  Surgeon: Jonathon Bellows, MD;  Location: [facility] ENDOSCOPY;  Service: Gastroenterology;  Laterality: N/A;  ? COLONOSCOPY WITH PROPOFOL N/A 11/16/2020  ? Procedure: COLONOSCOPY WITH PROPOFOL;  Surgeon: Jonathon Bellows, MD;  Location: [facility] ENDOSCOPY;  Service: Gastroenterology;  Laterality: N/A;  ? KNEE ARTHROSCOPY Right   ? PROSTATECTOMY    ? thyroid biop    ? ? ?Family History  ?Problem Relation Age of Onset  ? Diabetes Sister   ? ? ?Allergies  ?Allergen Reactions  ? Lidocaine   ?  Cut off wind  ? ? ?Current Outpatient Medications on File Prior to Visit  ?Medication Sig Dispense Refill  ? amLODipine (NORVASC) 10 MG tablet Take 1 tablet (10 mg total) by mouth daily. For blood pressure. 90 tablet 2  ? atorvastatin (LIPITOR) 20 MG tablet TAKE 1 TABLET DAILY FOR    CHOLESTEROL 90 tablet 2  ? Coenzyme Q10 (COQ10 PO) Take 1 capsule by mouth daily.     ? fenofibrate micronized (LOFIBRA) 134 MG capsule TAKE 1 CAPSULE DAILY BEFOREBREAKFAST FOR CHOLESTEROL 90 capsule 2  ? GLUCOSAMINE SULFATE PO Take by mouth.    ? losartan (COZAAR) 100 MG tablet TAKE 1 TABLET DAILY FOR    BLOOD PRESSURE 90 tablet 2  ? Multiple Vitamin (MULTIVITAMIN) tablet Take 1 tablet by mouth daily.    ? Omega-3 Fatty Acids (FISH OIL) 1000 MG CAPS Take 1,000 mg by mouth 2 (two) times daily.    ? sertraline (ZOLOFT) 50 MG tablet TAKE 1 TABLET BY MOUTH EVERY DAY 90 tablet 3  ? triamcinolone cream (KENALOG) 0.1 % Apply 1 application. topically See admin instructions. Use twice daily for  5 days weekly as needed to affected areas of body for psoriasis. Avoid applying to face, groin, and axilla. Use as directed. 80 g 2  ? ?No current facility-administered medications on file prior to visit.  ? ? ?BP 140/62   Pulse (!) 50   Temp 98.6 ?F (37 ?C) (Temporal)   Ht '6\' 2"'$  (1.88 m)   Wt 250 lb (113.4 kg)   SpO2 95%   BMI 32.10 kg/m?  ?Objective:  ?  Physical Exam ?Constitutional:   ?   General: He is not in acute distress. ?Cardiovascular:  ?   Comments: Mild right ankle and right dorsal pedal edema.  No pitting. ?Musculoskeletal:  ?   Lumbar back: No tenderness or bony tenderness. Negative right straight leg raise test and negative left straight leg raise test.  ?   Comments: Decrease in ROM with right lower extremity extension   ?Skin: ?   General: Skin is warm and dry.  ?Neurological:  ?  Mental Status: He is alert.  ? ? ? ? ? ?   ?Assessment & Plan:  ? ? ? ? ?This visit occurred during the SARS-CoV-2 public health emergency.  Safety protocols were in place, including screening questions prior to the visit, additional usage of staff PPE, and extensive cleaning of exam room while observing appropriate contact time as indicated for disinfecting solutions.  ?

## 2022-01-03 DIAGNOSIS — I10 Essential (primary) hypertension: Secondary | ICD-10-CM

## 2022-01-04 MED ORDER — HYDROCHLOROTHIAZIDE 12.5 MG PO TABS: 12.5000 mg | ORAL_TABLET | Freq: Every day | ORAL | 0 refills | Status: DC

## 2022-01-10 DIAGNOSIS — F411 Generalized anxiety disorder: Secondary | ICD-10-CM

## 2022-01-17 MED ORDER — SERTRALINE HCL 50 MG PO TABS: 50.0000 mg | ORAL_TABLET | Freq: Every day | ORAL | 1 refills | Status: DC

## 2022-01-19 ENCOUNTER — Ambulatory Visit (INDEPENDENT_AMBULATORY_CARE_PROVIDER_SITE_OTHER)
Admission: RE | Admit: 2022-01-19 | Discharge: 2022-01-19 | Disposition: A | Discharge status: Home / Self Care | Payer: Medicare Other | Source: Ambulatory Visit | Attending: Primary Care | Admitting: Primary Care

## 2022-01-19 ENCOUNTER — Encounter: Payer: Self-pay | Admitting: Primary Care

## 2022-01-19 ENCOUNTER — Ambulatory Visit (INDEPENDENT_AMBULATORY_CARE_PROVIDER_SITE_OTHER): Payer: Medicare Other | Admitting: Primary Care

## 2022-01-19 VITALS — BP 120/76 | HR 82 | Temp 98.6°F | Ht 74.0 in | Wt 240.0 lb

## 2022-01-19 DIAGNOSIS — M5441 Lumbago with sciatica, right side: Secondary | ICD-10-CM | POA: Diagnosis not present

## 2022-01-19 DIAGNOSIS — I10 Essential (primary) hypertension: Secondary | ICD-10-CM

## 2022-01-19 DIAGNOSIS — M545 Low back pain, unspecified: Secondary | ICD-10-CM | POA: Diagnosis not present

## 2022-01-19 LAB — BASIC METABOLIC PANEL
BUN: 30 mg/dL — ABNORMAL HIGH (ref 6–23)
CO2: 29 mEq/L (ref 19–32)
Calcium: 10.1 mg/dL (ref 8.4–10.5)
Chloride: 101 mEq/L (ref 96–112)
Creatinine, Ser: 1.59 mg/dL — ABNORMAL HIGH (ref 0.40–1.50)
GFR: 41.2 mL/min — ABNORMAL LOW (ref >60.00)
Glucose, Bld: 97 mg/dL (ref 70–99)
Potassium: 3.7 mEq/L (ref 3.5–5.1)
Sodium: 141 mEq/L (ref 135–145)

## 2022-01-19 MED ORDER — GABAPENTIN 100 MG PO CAPS: 100.0000 mg | ORAL_CAPSULE | Freq: Three times a day (TID) | ORAL | 0 refills | Status: DC

## 2022-01-19 MED ORDER — KETOROLAC TROMETHAMINE 60 MG/2ML IM SOLN
60.0000 mg | Freq: Once | INTRAMUSCULAR | Status: AC
  Administered 2022-01-19: 60 mg via INTRAMUSCULAR

## 2022-01-19 MED ORDER — CYCLOBENZAPRINE HCL 10 MG PO TABS: 10.0000 mg | ORAL_TABLET | Freq: Three times a day (TID) | ORAL | 0 refills | Status: DC | PRN

## 2022-01-19 NOTE — Addendum Note (Signed)
Addended by: Francella Solian on: 01/19/2022 08:21 AM   Modules accepted: Orders

## 2022-01-19 NOTE — Patient Instructions (Signed)
Stop by the lab and xray prior to leaving today. I will notify you of your results once received.   You may take gabapentin for your back pain. Take 1 to 3 capsules by mouth up to three times daily for pain. This may cause drowsiness.   You may take cyclobenzaprine 10 mg muscle relaxer three times daily as needed. This may cause drowsiness.   You will be contacted regarding your referral to neurosurgery.  Please let us know if you have not been contacted within two weeks.   Try to stretch when possible.   It was a pleasure to see you today!

## 2022-01-19 NOTE — Assessment & Plan Note (Signed)
Controlled.  Continue losartan 100 mg and HCTZ 12.5 mg daily. Checking BMP today.  Will combine 2 pills into losartan-HCTZ 100-12.5 mg once BMP returns.

## 2022-01-19 NOTE — Progress Notes (Signed)
Subjective:    Patient ID: Curtis Black., male    DOB: 1941/06/21, 81 y.o.   MRN: [id number]  HPI  Curtis Black. is a very pleasant 81 y.o. male with a history of hypertension, chronic sinus bradycardia, CKD, prediabetes, hyperlipidemia, acute back pain with sciatica who presents today to discuss ongoing back pain and follow-up for hypertension.  1) Hypertension: Currently managed on losartan 100 mg daily and hydrochlorothiazide 12.5 mg daily.  He was last evaluated in the office on 12/27/2021, endorsed a 1 month history of right dorsal foot and ankle swelling.  Suspicion for DVT was low, swelling was suspected to be secondary to a side effect of amlodipine for which she was taking 10 mg daily.  During this visit we continued his losartan 100 mg, reduce his amlodipine to 5 mg daily, and had him update Curtis Black with blood pressure readings a few weeks later.  He updated Curtis Black and notified Curtis Black of elevated blood pressure readings in the 160s/80's-90's and pedal edema was still present.  We discontinued his amlodipine and advised he start hydrochlorothiazide 12.5 mg daily.  He is here for follow-up today.  Since his last visit he is compliant to losartan 100 mg daily and hydrochlorothiazide 12.5 mg daily.   BP Readings from Last 3 Encounters:  01/19/22 120/76  12/27/21 140/62  07/29/21 (!) 152/86   2) Acute Back Pain: Acute to the right lower back with radiation to the right hip and also to right calf at times.  Intermittent numbness to right lower extremity.  He was last evaluated for this on 12/27/2021, symptoms began 1 week prior.  No alarm signs on exam so he was treated with oral steroids, tizanidine as needed, stretching/walking.  He contacted Curtis Black on 01/10/2022 reporting continued lower back pain with sciatica to right lower extremity.  He was asked to come in for reevaluation.  Today he continues to experience right lower back and buttock pain which is now radiating down to his foot. He  continues to notice intermittent numbness which is worse after sitting for prolonged periods of time. He describes his pain as sharp, shooting, tightness. His pain is worse with prolonged sitting and standing.   He denies injury, trauma, loss of bladder control. He's tried Ibuprofen, Tylenol, numerous topical agents, heat and ice with minimal relief. The Tizanidine and prednisone prescribed last visit did not help to reduce pain. He is not stretching due to his pain.    Review of Systems  Respiratory:  Negative for shortness of breath.   Cardiovascular:  Negative for chest pain.  Musculoskeletal:  Positive for arthralgias and back pain.  Neurological:  Positive for numbness. Negative for dizziness and weakness.        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   CKD (chronic kidney disease) stage 3, GFR 30-59 ml/min (HCC)    Essential hypertension    Hyperlipidemia    Prostate cancer ([place]) 2009   Psoriasis     Social History   Socioeconomic History   Marital status: Married    Spouse name: Not on file   Number of children: Not  on file   Years of education: Not on file   Highest education level: Not on file  Occupational History   Not on file  Tobacco Use   Smoking status: Former    Types: Cigarettes    Quit date: 1998    Years since quitting: 25.4   Smokeless tobacco: Never   Tobacco comments:    2000  Vaping Use   Vaping Use: Never used  Substance and Sexual Activity   Alcohol use: Yes    Alcohol/week: 7.0 standard drinks    Types: 7 Glasses of wine per week   Drug use: Not Currently   Sexual activity: Not Currently  Other Topics Concern   Not  on file  Social History Narrative   Married.   No children.   Retired. Once worked for [employer].   Enjoys working on his house, playing golf, riding his bike.    Social Determinants of Health   Financial Resource Strain: Not on file  Food Insecurity: Not on file  Transportation Needs: Not on file  Physical Activity: Not on file  Stress: Not on file  Social Connections: Not on file  Intimate Partner Violence: Not on file    Past Surgical History:  Procedure Laterality Date   APPENDECTOMY     BOWEL RESECTION     CATARACT EXTRACTION W/PHACO Right 01/05/2021   Procedure: CATARACT EXTRACTION PHACO AND INTRAOCULAR LENS PLACEMENT ([place]) RIGHT OMIDRIA;  Surgeon: Leandrew Koyanagi, MD;  Location: [facility];  Service: Ophthalmology;  Laterality: Right;  cde 5.46 01:01.7 minutes 8.9%   CATARACT EXTRACTION W/PHACO Left 01/26/2021   Procedure: CATARACT EXTRACTION PHACO AND INTRAOCULAR LENS PLACEMENT ([place]) LEFT OMIDRIA 5.35 00:50.6;  Surgeon: Leandrew Koyanagi, MD;  Location: [facility];  Service: Ophthalmology;  Laterality: Left;   COLON SURGERY     COLONOSCOPY WITH PROPOFOL N/A 06/05/2017   Procedure: COLONOSCOPY WITH PROPOFOL;  Surgeon: Jonathon Bellows, MD;  Location: [facility] ENDOSCOPY;  Service: Gastroenterology;  Laterality: N/A;   COLONOSCOPY WITH PROPOFOL N/A 06/15/2020   Procedure: COLONOSCOPY WITH PROPOFOL;  Surgeon: Jonathon Bellows, MD;  Location: [facility] ENDOSCOPY;  Service: Gastroenterology;  Laterality: N/A;   COLONOSCOPY WITH PROPOFOL N/A 11/16/2020   Procedure: COLONOSCOPY WITH PROPOFOL;  Surgeon: Jonathon Bellows, MD;  Location: [facility] ENDOSCOPY;  Service: Gastroenterology;  Laterality: N/A;   KNEE ARTHROSCOPY Right    PROSTATECTOMY     thyroid biop      Family History  Problem Relation Age of Onset   Diabetes Sister     Allergies  Allergen Reactions   Lidocaine     Cut off wind    Current Outpatient Medications on File Prior to Visit  Medication Sig Dispense  Refill   atorvastatin (LIPITOR) 20 MG tablet TAKE 1 TABLET DAILY FOR    CHOLESTEROL 90 tablet 2   Coenzyme Q10 (COQ10 PO) Take 1 capsule by mouth daily.      fenofibrate micronized (LOFIBRA) 134 MG capsule TAKE 1 CAPSULE DAILY BEFOREBREAKFAST FOR CHOLESTEROL 90 capsule 2   GLUCOSAMINE SULFATE PO Take by mouth.     hydrochlorothiazide (HYDRODIURIL) 12.5 MG tablet Take 1 tablet (12.5 mg total) by mouth daily. for blood pressure. 30 tablet 0   losartan (COZAAR) 100 MG tablet TAKE 1 TABLET DAILY FOR    BLOOD PRESSURE 90 tablet 2   Multiple Vitamin (MULTIVITAMIN) tablet Take 1 tablet by mouth daily.     Omega-3 Fatty Acids (FISH OIL) 1000 MG CAPS Take 1,000 mg by mouth 2 (two) times daily.  sertraline (ZOLOFT) 50 MG tablet Take 1 tablet (50 mg total) by mouth daily. For anxiety. 90 tablet 1   triamcinolone cream (KENALOG) 0.1 % Apply 1 application. topically See admin instructions. Use twice daily for  5 days weekly as needed to affected areas of body for psoriasis. Avoid applying to face, groin, and axilla. Use as directed. 80 g 2   No current facility-administered medications on file prior to visit.    BP 120/76   Pulse 82   Temp 98.6 F (37 C) (Oral)   Ht '6\' 2"'$  (1.88 m)   Wt 240 lb (108.9 kg)   SpO2 94%   BMI 30.81 kg/m  Objective:   Physical Exam Constitutional:      General: He is not in acute distress. Pulmonary:     Effort: Pulmonary effort is normal.  Musculoskeletal:     Lumbar back: Decreased range of motion. Negative right straight leg raise test and negative left straight leg raise test.       Back:     Comments: Altered gait with right lower extremity   Neurological:     Mental Status: He is alert.          Assessment & Plan:

## 2022-01-19 NOTE — Assessment & Plan Note (Signed)
Continued.  Exam without alarm signs.   Checking plain films of the lumbar spine today. IM Toradol 60 mg provided. Rx for cyclobenzaprine 10 TID PRN provided. Drowsiness precautions discussed. Rx for gabapentin 100-300 mg TID PRN provided. Drowsiness precautions discussed. Referral placed to neurosurgery.  Dicussed the need to stretch when possible.

## 2022-01-20 ENCOUNTER — Other Ambulatory Visit: Payer: Self-pay | Admitting: Primary Care

## 2022-01-20 DIAGNOSIS — I10 Essential (primary) hypertension: Secondary | ICD-10-CM

## 2022-01-20 MED ORDER — LOSARTAN POTASSIUM-HCTZ 100-12.5 MG PO TABS: 1.0000 | ORAL_TABLET | Freq: Every day | ORAL | 1 refills | Status: DC

## 2022-01-21 DIAGNOSIS — I10 Essential (primary) hypertension: Secondary | ICD-10-CM

## 2022-02-01 ENCOUNTER — Other Ambulatory Visit: Payer: Self-pay | Admitting: Primary Care

## 2022-02-01 DIAGNOSIS — I10 Essential (primary) hypertension: Secondary | ICD-10-CM

## 2022-02-21 ENCOUNTER — Other Ambulatory Visit: Payer: Self-pay | Admitting: Primary Care

## 2022-02-21 DIAGNOSIS — I10 Essential (primary) hypertension: Secondary | ICD-10-CM

## 2022-02-21 DIAGNOSIS — E785 Hyperlipidemia, unspecified: Secondary | ICD-10-CM

## 2022-03-02 ENCOUNTER — Ambulatory Visit
Admission: RE | Admit: 2022-03-02 | Discharge: 2022-03-02 | Disposition: A | Discharge status: Home / Self Care | Payer: Medicare Other | Source: Ambulatory Visit | Attending: Primary Care | Admitting: Primary Care

## 2022-03-02 ENCOUNTER — Ambulatory Visit (INDEPENDENT_AMBULATORY_CARE_PROVIDER_SITE_OTHER): Payer: Medicare Other | Admitting: Primary Care

## 2022-03-02 ENCOUNTER — Encounter: Payer: Self-pay | Admitting: Primary Care

## 2022-03-02 ENCOUNTER — Other Ambulatory Visit: Payer: Self-pay | Admitting: Primary Care

## 2022-03-02 ENCOUNTER — Telehealth: Payer: Self-pay

## 2022-03-02 VITALS — BP 134/76 | HR 57 | Temp 98.7°F | Ht 74.0 in | Wt 235.0 lb

## 2022-03-02 DIAGNOSIS — R600 Localized edema: Secondary | ICD-10-CM | POA: Diagnosis not present

## 2022-03-02 DIAGNOSIS — M5441 Lumbago with sciatica, right side: Secondary | ICD-10-CM | POA: Insufficient documentation

## 2022-03-02 DIAGNOSIS — I82441 Acute embolism and thrombosis of right tibial vein: Secondary | ICD-10-CM | POA: Diagnosis not present

## 2022-03-02 DIAGNOSIS — R200 Anesthesia of skin: Secondary | ICD-10-CM | POA: Diagnosis not present

## 2022-03-02 DIAGNOSIS — I10 Essential (primary) hypertension: Secondary | ICD-10-CM | POA: Diagnosis not present

## 2022-03-02 DIAGNOSIS — I82401 Acute embolism and thrombosis of unspecified deep veins of right lower extremity: Secondary | ICD-10-CM | POA: Diagnosis not present

## 2022-03-02 DIAGNOSIS — N1831 Chronic kidney disease, stage 3a: Secondary | ICD-10-CM

## 2022-03-02 DIAGNOSIS — Z8546 Personal history of malignant neoplasm of prostate: Secondary | ICD-10-CM

## 2022-03-02 DIAGNOSIS — M545 Low back pain, unspecified: Secondary | ICD-10-CM | POA: Diagnosis not present

## 2022-03-02 HISTORY — DX: Localized edema: R60.0

## 2022-03-02 LAB — BASIC METABOLIC PANEL
BUN: 23 mg/dL (ref 6–23)
CO2: 27 mEq/L (ref 19–32)
Calcium: 9.9 mg/dL (ref 8.4–10.5)
Chloride: 102 mEq/L (ref 96–112)
Creatinine, Ser: 1.4 mg/dL (ref 0.40–1.50)
GFR: 47.96 mL/min — ABNORMAL LOW (ref >60.00)
Glucose, Bld: 102 mg/dL — ABNORMAL HIGH (ref 70–99)
Potassium: 3.9 mEq/L (ref 3.5–5.1)
Sodium: 138 mEq/L (ref 135–145)

## 2022-03-02 LAB — PSA: PSA: 0 ng/mL — ABNORMAL LOW (ref 0.10–4.00)

## 2022-03-02 MED ORDER — APIXABAN 5 MG PO TABS: ORAL_TABLET | ORAL | 0 refills | Status: DC

## 2022-03-02 NOTE — Telephone Encounter (Signed)
Call received from Ultrasound. Kim states ultrasound was positive for DVT. Patient was still at office. Provider requested that we take message and let patient know that Anda Kraft will contact them in the next few hours. Patient ok to be released. Renold Don of information she will let patient know.   Patient would like call on his cell phone.

## 2022-03-02 NOTE — Addendum Note (Signed)
Addended by: Eddie North C on: 03/02/2022 02:01 PM   Modules accepted: Orders

## 2022-03-02 NOTE — Patient Instructions (Addendum)
Stop by the lab prior to leaving today. I will notify you of your results once received.   You will be contacted regarding your MRI and Ultrasound.  Please let us know if you have not been contacted within two weeks.   It was a pleasure to see you today!

## 2022-03-02 NOTE — Assessment & Plan Note (Signed)
With new symptoms.  Stat MRI Lumbar spine ordered and pending. Follow up with neurosurgery as scheduled.

## 2022-03-02 NOTE — Telephone Encounter (Signed)
Attempted to reach patient via phone, no answer, voicemail left to call back soon as possible.

## 2022-03-02 NOTE — Assessment & Plan Note (Signed)
Could be secondary to acute right lower back pain, need to rule out DVT.  Stat MRI of lumbar spine and Stat right lower extremity venous US ordered and pending.

## 2022-03-02 NOTE — Assessment & Plan Note (Signed)
No NSAID use recently. He is drinking plenty of water.  Repeat BMP pending.

## 2022-03-02 NOTE — Progress Notes (Signed)
Subjective:    Patient ID: Curtis Rankin., male    DOB: 01-30-1941, 81 y.o.   MRN: [id number]  HPI  Curtis Black. is a very pleasant 81 y.o. male with a history of hypertension, chronic sinus bradycardia, chronic lower back pain, CKD stage III, hyperlipidemia, prediabetes who presents today for follow-up of CKD and chronic back pain.  Long history of CKD stage III that dates back at least until 2016.  During his recent visit in May 2023 kidney function had declined slightly with creatinine of 1.59 and GFR 41.2.  At the time he was taking a lot of ibuprofen for his acute on chronic lower back pain.  He underwent CT renal stone study in December 2022 which revealed several renal cortical cysts measuring up to 6.9 cm in the upper right kidney.  He underwent renal ultrasound in October 2022 which revealed bilateral renal cysts, simple.  He is here today for repeat BMP. He is drinking water. He is not taking NSAID's.   He continues to experience right lower back pain with intermittent right lower extremity pain with numbness. He has now noticed right lower extremity swelling that is prominent to his right calf, right ankle, and right foot. During his last visit he had mild swelling to his right ankle and foot, no swelling to his calf.   He is not taking anything for pain. He has an appointment scheduled with neurosurgery for next week. He denies loss of bowel/bladder control. He has fallen several times due to imbalance and weakness of his right lower extremity. This is new.    Review of Systems  Musculoskeletal:  Positive for back pain.  Skin:  Positive for color change.  Neurological:  Positive for weakness and numbness. Negative for dizziness.         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   CKD (chronic kidney disease) stage 3, GFR 30-59 ml/min (HCC)    Essential hypertension    Hyperlipidemia    Prostate cancer ([place]) 2009   Psoriasis     Social History   Socioeconomic History   Marital status: Married    Spouse name: Not on file   Number of children: Not on file   Years of education: Not on file   Highest education level: Not on file  Occupational History   Not on file  Tobacco Use   Smoking status: Former    Types: Cigarettes    Quit date: 1998    Years since quitting: 25.5   Smokeless tobacco: Never   Tobacco comments:    2000  Vaping Use   Vaping Use: Never used  Substance and Sexual Activity   Alcohol use: Yes    Alcohol/week: 7.0 standard drinks of alcohol    Types: 7 Glasses of wine per week   Drug use: Not Currently   Sexual activity: Not Currently  Other Topics Concern   Not on file  Social History Narrative   Married.   No children.   Retired. Once worked for [employer].   Enjoys working on his house, playing golf, riding his bike.  Social Determinants of Health   Financial Resource Strain: Not on file  Food Insecurity: Not on file  Transportation Needs: Not on file  Physical Activity: Not on file  Stress: Not on file  Social Connections: Not on file  Intimate Partner Violence: Not on file    Past Surgical History:  Procedure Laterality Date   APPENDECTOMY     BOWEL RESECTION     CATARACT EXTRACTION W/PHACO Right 01/05/2021   Procedure: CATARACT EXTRACTION PHACO AND INTRAOCULAR LENS PLACEMENT ([place]) RIGHT OMIDRIA;  Surgeon: Leandrew Koyanagi, MD;  Location: [place];  Service: Ophthalmology;  Laterality: Right;  cde 5.46 01:01.7 minutes 8.9%   CATARACT EXTRACTION W/PHACO Left 01/26/2021   Procedure: CATARACT EXTRACTION PHACO AND INTRAOCULAR LENS PLACEMENT ([place]) LEFT OMIDRIA 5.35  00:50.6;  Surgeon: Leandrew Koyanagi, MD;  Location: [place];  Service: Ophthalmology;  Laterality: Left;   COLON SURGERY     COLONOSCOPY WITH PROPOFOL N/A 06/05/2017   Procedure: COLONOSCOPY WITH PROPOFOL;  Surgeon: Jonathon Bellows, MD;  Location[facility] ENDOSCOPY;  Service: Gastroenterology;  Laterality: N/A;   COLONOSCOPY WITH PROPOFOL N/A 06/15/2020   Procedure: COLONOSCOPY WITH PROPOFOL;  Surgeon: Jonathon Bellows, MD;  Location: [facility] ENDOSCOPY;  Service: Gastroenterology;  Laterality: N/A;   COLONOSCOPY WITH PROPOFOL N/A 11/16/2020   Procedure: COLONOSCOPY WITH PROPOFOL;  Surgeon: Jonathon Bellows, MD;  Location: [facility] ENDOSCOPY;  Service: Gastroenterology;  Laterality: N/A;   KNEE ARTHROSCOPY Right    PROSTATECTOMY     thyroid biop      Family History  Problem Relation Age of Onset   Diabetes Sister     Allergies  Allergen Reactions   Lidocaine     Cut off wind    Current Outpatient Medications on File Prior to Visit  Medication Sig Dispense Refill   atorvastatin (LIPITOR) 20 MG tablet TAKE 1 TABLET DAILY FOR    CHOLESTEROL 90 tablet 2   Coenzyme Q10 (COQ10 PO) Take 1 capsule by mouth daily.      fenofibrate micronized (LOFIBRA) 134 MG capsule TAKE 1 CAPSULE DAILY BEFOREBREAKFAST FOR CHOLESTEROL 90 capsule 2   GLUCOSAMINE SULFATE PO Take by mouth.     losartan-hydrochlorothiazide (HYZAAR) 100-12.5 MG tablet Take 1 tablet by mouth daily. for blood pressure. 90 tablet 1   Multiple Vitamin (MULTIVITAMIN) tablet Take 1 tablet by mouth daily.     Omega-3 Fatty Acids (FISH OIL) 1000 MG CAPS Take 1,000 mg by mouth 2 (two) times daily.     sertraline (ZOLOFT) 50 MG tablet Take 1 tablet (50 mg total) by mouth daily. For anxiety. 90 tablet 1   triamcinolone cream (KENALOG) 0.1 % Apply 1 application. topically See admin instructions. Use twice daily for  5 days weekly as needed to affected areas of body for psoriasis. Avoid applying to face, groin, and axilla. Use as directed. 80 g 2   No  current facility-administered medications on file prior to visit.    BP 134/76   Pulse (!) 57   Temp 98.7 F (37.1 C) (Oral)   Ht '6\' 2"'$  (1.88 m)   Wt 235 lb (106.6 kg)   SpO2 95%   BMI 30.17 kg/m  Objective:   Physical Exam Constitutional:      General: He is not in acute distress. Cardiovascular:     Rate and Rhythm: Normal rate and regular rhythm.     Pulses:          Dorsalis pedis pulses are 2+ on the right side.  Posterior tibial pulses are 2+ on the right side.     Comments: Moderate right foot, ankle, and calf swelling on right lower extremity. No pitting. Pulmonary:     Effort: Pulmonary effort is normal.  Musculoskeletal:     Cervical back: Neck supple.     Lumbar back: Decreased range of motion. Negative right straight leg raise test and negative left straight leg raise test.       Back:     Comments: 5/5 strength to bilateral lower extremities while sitting on exam table. Ambulates with right limp  Skin:    General: Skin is warm and dry.     Findings: Erythema present.     Comments: Erythema to right dorsal foot   Neurological:     Mental Status: He is alert and oriented to person, place, and time.           Assessment & Plan:   Problem List Items Addressed This Visit       Nervous and Auditory   Acute right-sided low back pain with right-sided sciatica - Primary    With new symptoms.  Stat MRI Lumbar spine ordered and pending. Follow up with neurosurgery as scheduled.       Relevant Orders   MR Lumbar Spine Wo Contrast     Genitourinary   CKD (chronic kidney disease) stage 3, GFR 30-59 ml/min (HCC)    No NSAID use recently. He is drinking plenty of water.  Repeat BMP pending.        Other   Edema of right lower extremity    Could be secondary to acute right lower back pain, need to rule out DVT.  Stat MRI of lumbar spine and Stat right lower extremity venous US ordered and pending.      Relevant Orders   US Venous Img Lower  Unilateral Right (DVT)       Pleas Koch, NP

## 2022-03-09 ENCOUNTER — Ambulatory Visit (INDEPENDENT_AMBULATORY_CARE_PROVIDER_SITE_OTHER): Payer: Medicare Other | Admitting: Neurosurgery

## 2022-03-09 ENCOUNTER — Encounter: Payer: Self-pay | Admitting: Neurosurgery

## 2022-03-09 VITALS — BP 120/78 | Ht 74.0 in | Wt 237.4 lb

## 2022-03-09 DIAGNOSIS — M5416 Radiculopathy, lumbar region: Secondary | ICD-10-CM

## 2022-03-09 NOTE — Progress Notes (Signed)
Referring Physician:  Pleas Amaiyah Nordhoff, NP [facility] [place],  [place] [id number]  Primary Physician:  Pleas Curtis Rising, NP  History of Present Illness: 03/09/2022 [name] is a 81 y.o with a history of recently diagnosed DVT (7/6) on Eliquis, and, hyperlipidemia, who is here today with a chief complaint of back and right leg pain.  He states that this started about 5 months ago and attributes it to resuming riding his recumbent bike.  The pain seems to start in his right low back and radiate into his right buttock and calf.  He describes pain as a sharp pain that is worse with activity.  In addition to this he also endorses numbness in a similar pattern and burning in the bottom of his right foot.  He states as a result of this he has had episodes of feeling as though his right leg is going to give out on him and has fallen.  He denies any left-sided symptoms.  He has attempted to take ibuprofen since the onset of the symptoms however his primary care provider stopped this as he was having an decrease in his kidney function.  He has not attempted any physical therapy or epidural injections.  Conservative measures:  Physical therapy: None recently Multimodal medical therapy including regular antiinflammatories: Ibuprofen but was discontinued due to renal function Injections: No previous epidural steroid injections  Past Surgery: No previous spine surgeries  Curtis Black. has no symptoms of cervical myelopathy.  The symptoms are causing a significant impact on the patient's life.   Review of Systems:  A 10 point review of systems is negative, except for the pertinent positives and negatives detailed in the HPI.  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   CKD (chronic kidney disease) stage 3, GFR 30-59 ml/min (HCC)    Essential hypertension    Hyperlipidemia    Prostate cancer ([place]) 2009   Psoriasis     Past Surgical History: Past Surgical History:  Procedure Laterality Date   APPENDECTOMY     BOWEL RESECTION     CATARACT EXTRACTION W/PHACO Right 01/05/2021   Procedure: CATARACT EXTRACTION PHACO AND INTRAOCULAR LENS PLACEMENT ([place] [place]) RIGHT OMIDRIA;  Surgeon: Curtis Koyanagi, MD;  Location: [facility];  Service: Ophthalmology;  Laterality: Right;  cde 5.46 01:01.7 minutes 8.9%   CATARACT EXTRACTION W/PHACO Left 01/26/2021   Procedure: CATARACT EXTRACTION PHACO AND INTRAOCULAR LENS PLACEMENT ([place] [place]) LEFT OMIDRIA 5.35 00:50.6;  Surgeon: Curtis Koyanagi, MD;  Location: [facility];  Service: Ophthalmology;  Laterality: Left;   COLON SURGERY     COLONOSCOPY WITH PROPOFOL N/A 06/05/2017   Procedure: COLONOSCOPY WITH PROPOFOL;  Surgeon: Curtis Bellows, MD;  Location: [facility] ENDOSCOPY;  Service: Gastroenterology;  Laterality: N/A;   COLONOSCOPY WITH PROPOFOL N/A 06/15/2020   Procedure: COLONOSCOPY WITH PROPOFOL;  Surgeon: Curtis Bellows, MD;  Location: [facility] ENDOSCOPY;  Service: Gastroenterology;  Laterality: N/A;   COLONOSCOPY WITH PROPOFOL N/A 11/16/2020   Procedure: COLONOSCOPY WITH PROPOFOL;  Surgeon: Curtis Bellows, MD;  Location: [facility] ENDOSCOPY;  Service: Gastroenterology;  Laterality: N/A;   KNEE ARTHROSCOPY  Right    PROSTATECTOMY     thyroid biop      Allergies: Allergies as of 03/09/2022 - Review Complete 03/02/2022  Allergen Reaction Noted   Lidocaine  12/17/2019    Medications: Outpatient Encounter Medications as of 03/09/2022  Medication Sig   apixaban (ELIQUIS) 5 MG TABS tablet Take 2 tablets by mouth twice daily for 7 days, then reduce to 1 tablet twice daily thereafter.    atorvastatin (LIPITOR) 20 MG tablet TAKE 1 TABLET DAILY FOR    CHOLESTEROL   Coenzyme Q10 (COQ10 PO) Take 1 capsule by mouth daily.    fenofibrate micronized (LOFIBRA) 134 MG capsule TAKE 1 CAPSULE DAILY BEFOREBREAKFAST FOR CHOLESTEROL   GLUCOSAMINE SULFATE PO Take by mouth.   losartan-hydrochlorothiazide (HYZAAR) 100-12.5 MG tablet Take 1 tablet by mouth daily. for blood pressure.   Multiple Vitamin (MULTIVITAMIN) tablet Take 1 tablet by mouth daily.   Omega-3 Fatty Acids (FISH OIL) 1000 MG CAPS Take 1,000 mg by mouth 2 (two) times daily.   sertraline (ZOLOFT) 50 MG tablet Take 1 tablet (50 mg total) by mouth daily. For anxiety.   triamcinolone cream (KENALOG) 0.1 % Apply 1 application. topically See admin instructions. Use twice daily for  5 days weekly as needed to affected areas of body for psoriasis. Avoid applying to face, groin, and axilla. Use as directed.   No facility-administered encounter medications on file as of 03/09/2022.    Social History: Social History   Tobacco Use   Smoking status: Former    Types: Cigarettes    Quit date: 1998    Years since quitting: 25.5   Smokeless tobacco: Never   Tobacco comments:    2000  Vaping Use   Vaping Use: Never used  Substance Use Topics   Alcohol use: Yes    Alcohol/week: 7.0 standard drinks of alcohol    Types: 7 Glasses of wine per week   Drug use: Not Currently    Family Medical History: Family History  Problem Relation Age of Onset   Diabetes Sister     Physical Examination: '@VITALWITHPAIN'$ @  General: Patient is well developed, well nourished, calm, collected, and in no apparent distress. Attention to examination is appropriate.  Psychiatric: Patient is non-anxious.  Head:  Pupils equal, round, and reactive to light.  ENT:  Oral mucosa appears well hydrated.  Neck:   Supple.    Respiratory: Patient is breathing without any difficulty.  Extremities: Swelling with 2+ pedal edema in the right lower extremity.   Swelling in left hand secondary to a recent wasp sting.  Vascular: Palpable dorsal pedal pulses.    Skin:   On exposed skin, there are no abnormal skin lesions.  NEUROLOGICAL:     Awake, alert, oriented to person, place, and time.  Speech is clear and fluent. Fund of knowledge is appropriate.   Cranial Nerves: Pupils equal round and reactive to light.  Facial tone is symmetric.  Facial sensation is symmetric. Shoulder shrug is symmetric.  ROM of spine: full.  Palpation of spine: non tender.    Strength: Side Biceps Triceps Deltoid Interossei Grip Wrist Ext. Wrist Flex.  R '5 5 5 5 5 5 5  '$ L '5 5 5 5 5 5 5   '$ Side Iliopsoas Quads Hamstring PF DF EHL  R '5 5 5 5 5 '$ 4-  L '5 5 5 5 5 5   '$ Reflexes are 1+ and symmetric at the biceps, triceps, brachioradialis, patella and achilles.   Hoffman's is absent.  Clonus is not present.  Toes are down-going.  Bilateral upper sensation is intact to light touch.  Intact sensation to light touch throughout left lower extremity.  Patchy decreased sensation to light touch in the right lower extremity. Ambulates with an antalgic gait  Medical Decision Making  Imaging: MRI L-spine 03/02/22  IMPRESSION: 1. 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 2. 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     Electronically Signed   By: Jorje Guild M.D.   On: 03/02/2022 11:50  I have personally reviewed the images and agree with the above interpretation.  Assessment and Plan: Mr. Nauta is a pleasant 81 y.o. male with 5 months of right-sided low back pain and radiating right leg pain.  While the patient admits that it is improved some it is still significantly affecting his quality of life he would like to pursue further treatment options.  Unfortunately he was recently diagnosed with a DVT and started on Eliquis which his primary  care provider plans to keep him on for 3 months.  This precludes him from any elective surgical intervention.  I recommended physical therapy and have placed a referral to Dr. Sharlet Salina for epidural steroid injection.  I will see him back via telephone visit in about 8 weeks to assess his progress.  He was encouraged to call the office in the interim with any questions or concerns.  He expressed understanding was in agreement with this plan.  Thank you for involving me in the care of this patient.   This note was partially dictated using voice recognition software, so please excuse any errors that were not corrected.  I spent a total of 39 minutes in both face-to-face and non-face-to-face activities for this visit on the date of this encounter.  [facility]

## 2022-03-15 DIAGNOSIS — I82441 Acute embolism and thrombosis of right tibial vein: Secondary | ICD-10-CM

## 2022-03-16 ENCOUNTER — Ambulatory Visit (INDEPENDENT_AMBULATORY_CARE_PROVIDER_SITE_OTHER): Payer: Medicare Other | Admitting: Dermatology

## 2022-03-16 DIAGNOSIS — L578 Other skin changes due to chronic exposure to nonionizing radiation: Secondary | ICD-10-CM | POA: Diagnosis not present

## 2022-03-16 DIAGNOSIS — L820 Inflamed seborrheic keratosis: Secondary | ICD-10-CM

## 2022-03-16 DIAGNOSIS — C44319 Basal cell carcinoma of skin of other parts of face: Secondary | ICD-10-CM | POA: Diagnosis not present

## 2022-03-16 DIAGNOSIS — C44310 Basal cell carcinoma of skin of unspecified parts of face: Secondary | ICD-10-CM

## 2022-03-16 DIAGNOSIS — L570 Actinic keratosis: Secondary | ICD-10-CM | POA: Diagnosis not present

## 2022-03-16 DIAGNOSIS — L409 Psoriasis, unspecified: Secondary | ICD-10-CM

## 2022-03-16 NOTE — Progress Notes (Signed)
Follow-Up Visit   Subjective  Curtis Black is a 81 y.o. male who presents for the following: Psoriasis (3 months f/u on Psoriasis on the trunk and extremities , treating with Triamcinolone cream ). Recheck superior glabella biopsy proven [facility] 12/15/2021 scheduled for Mohs surgery with Dr Lacinda Axon August 1.  The patient has spots, moles and lesions to be evaluated, some may be new or changing and the patient has concerns that these could be cancer.  The following portions of the chart were reviewed this encounter and updated as appropriate:   Tobacco  Allergies  Meds  Problems  Med Hx  Surg Hx  Fam Hx     Review of Systems:  No other skin or systemic complaints except as noted in HPI or Assessment and Plan.  Objective  Well appearing patient in no apparent distress; mood and affect are within normal limits.  A focused examination was performed including trunk, exts, face. Relevant physical exam findings are noted in the Assessment and Plan.  Head - Anterior (Face) Pink pearly papule or plaque with arborizing vessels.   right hip Well-demarcated erythematous papules/plaques with silvery scale, guttate pink scaly papules.   face, (2) Erythematous thin papules/macules with gritty scale.   face, hands, arms (18) Stuck-on, waxy, tan-brown papule or plaque --Discussed benign etiology and prognosis.     Assessment & Plan  Basal cell carcinoma (BCC) of skin of face, unspecified part of face Head - Anterior (Face) Keep scheduled Mohs surgery appointment with Dr Lacinda Axon March 28, 2022  Psoriasis right hip Psoriasis is a chronic non-curable, but treatable genetic/hereditary disease that may have other systemic features affecting other organ systems such as joints (Psoriatic Arthritis). It is associated with an increased risk of inflammatory bowel disease, heart disease, non-alcoholic fatty liver disease, and depression.    Chronic and persistent condition with duration or expected duration over  one year. Condition is symptomatic/ bothersome to patient. Not currently at goal.   Continue Triamcinolone cream as directed   Related Medications triamcinolone cream (KENALOG) 0.1 % Apply 1 application. topically See admin instructions. Use twice daily for  5 days weekly as needed to affected areas of body for psoriasis. Avoid applying to face, groin, and axilla. Use as directed.  AK (actinic keratosis) (2) face, Actinic keratoses are precancerous spots that appear secondary to cumulative UV radiation exposure/sun exposure over time. They are chronic with expected duration over 1 year. A portion of actinic keratoses will progress to squamous cell carcinoma of the skin. It is not possible to reliably predict which spots will progress to skin cancer and so treatment is recommended to prevent development of skin cancer. Recommend daily broad spectrum sunscreen SPF 30+ to sun-exposed areas, reapply every 2 hours as needed.  Recommend staying in the shade or wearing long sleeves, sun glasses (UVA+UVB protection) and wide brim hats (4-inch brim around the entire circumference of the hat). Call for new or changing lesions.   Destruction of lesion - face,  Destruction method: cryotherapy   Informed consent: discussed and consent obtained   Lesion destroyed using liquid nitrogen: Yes   Region frozen until ice ball extended beyond lesion: Yes   Outcome: patient tolerated procedure well with no complications   Post-procedure details: wound care instructions given    Inflamed seborrheic keratosis (18) face, hands, arms Symptomatic, irritating, patient would like treated.  Destruction of lesion - face, hands, arms Destruction method: cryotherapy   Informed consent: discussed and consent obtained   Lesion destroyed using liquid  nitrogen: Yes   Region frozen until ice ball extended beyond lesion: Yes   Outcome: patient tolerated procedure well with no complications   Post-procedure details: wound  care instructions given    Actinic Damage - chronic, secondary to cumulative UV radiation exposure/sun exposure over time - diffuse scaly erythematous macules with underlying dyspigmentation - Recommend daily broad spectrum sunscreen SPF 30+ to sun-exposed areas, reapply every 2 hours as needed.  - Recommend staying in the shade or wearing long sleeves, sun glasses (UVA+UVB protection) and wide brim hats (4-inch brim around the entire circumference of the hat). - Call for new or changing lesions.   Return in about 6 months (around 09/16/2022) for hx of AKs .  IMarye Round, CMA, am acting as scribe for Sarina Ser, MD .  Documentation: I have reviewed the above documentation for accuracy and completeness, and I agree with the above.  Sarina Ser, MD

## 2022-03-16 NOTE — Patient Instructions (Addendum)
Cryotherapy Aftercare  Wash gently with soap and water everyday.   Apply Vaseline and Band-Aid daily until healed.     Due to recent changes in healthcare laws, you may see results of your pathology and/or laboratory studies on MyChart before the doctors have had a chance to review them. We understand that in some cases there may be results that are confusing or concerning to you. Please understand that not all results are received at the same time and often the doctors may need to interpret multiple results in order to provide you with the best plan of care or course of treatment. Therefore, we ask that you please give us 2 business days to thoroughly review all your results before contacting the office for clarification. Should we see a critical lab result, you will be contacted sooner.   If You Need Anything After Your Visit  If you have any questions or concerns for your doctor, please call our main line at [phone] and press option 4 to reach your doctor's medical assistant. If no one answers, please leave a voicemail as directed and we will return your call as soon as possible. Messages left after 4 pm will be answered the following business day.   You may also send us a message via MyChart. We typically respond to MyChart messages within 1-2 business days.  For prescription refills, please ask your pharmacy to contact our office. Our fax number is [phone].  If you have an urgent issue when the clinic is closed that cannot wait until the next business day, you can page your doctor at the number below.    Please note that while we do our best to be available for urgent issues outside of office hours, we are not available 24/7.   If you have an urgent issue and are unable to reach us, you may choose to seek medical care at your doctor's office, retail clinic, urgent care center, or emergency room.  If you have a medical emergency, please immediately call 911 or go to the  emergency department.  Pager Numbers  - Dr. Kowalski: [phone]  - Dr. Moye: [phone]  - Dr. Stewart: [phone]  In the event of inclement weather, please call our main line at [phone] for an update on the status of any delays or closures.  Dermatology Medication Tips: Please keep the boxes that topical medications come in in order to help keep track of the instructions about where and how to use these. Pharmacies typically print the medication instructions only on the boxes and not directly on the medication tubes.   If your medication is too expensive, please contact our office at [phone] option 4 or send us a message through MyChart.   We are unable to tell what your co-pay for medications will be in advance as this is different depending on your insurance coverage. However, we may be able to find a substitute medication at lower cost or fill out paperwork to get insurance to cover a needed medication.   If a prior authorization is required to get your medication covered by your insurance company, please allow us 1-2 business days to complete this process.  Drug prices often vary depending on where the prescription is filled and some pharmacies may offer cheaper prices.  The website www.goodrx.com contains coupons for medications through different pharmacies. The prices here do not account for what the cost may be with help from insurance (it may be cheaper with your insurance), but the website can   give you the price if you did not use any insurance.  - You can print the associated coupon and take it with your prescription to the pharmacy.  - You may also stop by our office during regular business hours and pick up a GoodRx coupon card.  - If you need your prescription sent electronically to a different pharmacy, notify our office through [facility] MyChart or by phone at [phone] option 4.     Si Usted Necesita Algo Despus de Su Visita  Tambin puede  enviarnos un mensaje a travs de MyChart. Por lo general respondemos a los mensajes de MyChart en el transcurso de 1 a 2 das hbiles.  Para renovar recetas, por favor pida a su farmacia que se ponga en contacto con nuestra oficina. Nuestro nmero de fax es el [phone].  Si tiene un asunto urgente cuando la clnica est cerrada y que no puede esperar hasta el siguiente da hbil, puede llamar/localizar a su doctor(a) al nmero que aparece a continuacin.   Por favor, tenga en cuenta que aunque hacemos todo lo posible para estar disponibles para asuntos urgentes fuera del horario de oficina, no estamos disponibles las 24 horas del da, los 7 das de la semana.   Si tiene un problema urgente y no puede comunicarse con nosotros, puede optar por buscar atencin mdica  en el consultorio de su doctor(a), en una clnica privada, en un centro de atencin urgente o en una sala de emergencias.  Si tiene una emergencia mdica, por favor llame inmediatamente al 911 o vaya a la sala de emergencias.  Nmeros de bper  - Dr. Kowalski: [phone]  - Dra. Moye: [phone]  - Dra. Stewart: [phone]  En caso de inclemencias del tiempo, por favor llame a nuestra lnea principal al [phone] para una actualizacin sobre el estado de cualquier retraso o cierre.  Consejos para la medicacin en dermatologa: Por favor, guarde las cajas en las que vienen los medicamentos de uso tpico para ayudarle a seguir las instrucciones sobre dnde y cmo usarlos. Las farmacias generalmente imprimen las instrucciones del medicamento slo en las cajas y no directamente en los tubos del medicamento.   Si su medicamento es muy caro, por favor, pngase en contacto con nuestra oficina llamando al [phone] y presione la opcin 4 o envenos un mensaje a travs de MyChart.   No podemos decirle cul ser su copago por los medicamentos por adelantado ya que esto es diferente dependiendo de la cobertura de su seguro.  Sin embargo, es posible que podamos encontrar un medicamento sustituto a menor costo o llenar un formulario para que el seguro cubra el medicamento que se considera necesario.   Si se requiere una autorizacin previa para que su compaa de seguros cubra su medicamento, por favor permtanos de 1 a 2 das hbiles para completar este proceso.  Los precios de los medicamentos varan con frecuencia dependiendo del lugar de dnde se surte la receta y alguna farmacias pueden ofrecer precios ms baratos.  El sitio web www.goodrx.com tiene cupones para medicamentos de diferentes farmacias. Los precios aqu no tienen en cuenta lo que podra costar con la ayuda del seguro (puede ser ms barato con su seguro), pero el sitio web puede darle el precio si no utiliz ningn seguro.  - Puede imprimir el cupn correspondiente y llevarlo con su receta a la farmacia.  - Tambin puede pasar por nuestra oficina durante el horario de atencin regular y recoger una tarjeta de cupones de GoodRx.  -   Si necesita que su receta se enve electrnicamente a una farmacia diferente, informe a nuestra oficina a travs de MyChart de [place] o por telfono llamando al [phone] y presione la opcin 4.  

## 2022-03-23 DIAGNOSIS — C44319 Basal cell carcinoma of skin of other parts of face: Secondary | ICD-10-CM | POA: Diagnosis not present

## 2022-03-25 ENCOUNTER — Encounter: Payer: Self-pay | Admitting: Dermatology

## 2022-03-28 ENCOUNTER — Other Ambulatory Visit: Payer: Self-pay

## 2022-03-28 DIAGNOSIS — C44319 Basal cell carcinoma of skin of other parts of face: Secondary | ICD-10-CM | POA: Diagnosis not present

## 2022-03-28 DIAGNOSIS — I82441 Acute embolism and thrombosis of right tibial vein: Secondary | ICD-10-CM

## 2022-03-28 MED ORDER — APIXABAN 5 MG PO TABS
5.0000 mg | ORAL_TABLET | Freq: Two times a day (BID) | ORAL | 1 refills | Status: DC
Start: 2022-03-28 — End: 2022-05-08

## 2022-03-29 ENCOUNTER — Encounter: Payer: Self-pay | Admitting: Oncology

## 2022-03-29 ENCOUNTER — Inpatient Hospital Stay: Payer: Medicare Other | Attending: Oncology | Admitting: Oncology

## 2022-03-29 ENCOUNTER — Inpatient Hospital Stay: Payer: Medicare Other

## 2022-03-29 VITALS — BP 153/78 | HR 49 | Temp 97.8°F | Resp 18 | Ht 74.0 in | Wt 241.4 lb

## 2022-03-29 DIAGNOSIS — Z86718 Personal history of other venous thrombosis and embolism: Secondary | ICD-10-CM | POA: Insufficient documentation

## 2022-03-29 DIAGNOSIS — I82401 Acute embolism and thrombosis of unspecified deep veins of right lower extremity: Secondary | ICD-10-CM | POA: Insufficient documentation

## 2022-03-29 DIAGNOSIS — Z7901 Long term (current) use of anticoagulants: Secondary | ICD-10-CM | POA: Insufficient documentation

## 2022-03-29 DIAGNOSIS — Z8546 Personal history of malignant neoplasm of prostate: Secondary | ICD-10-CM | POA: Insufficient documentation

## 2022-03-29 DIAGNOSIS — Z79899 Other long term (current) drug therapy: Secondary | ICD-10-CM | POA: Insufficient documentation

## 2022-03-29 DIAGNOSIS — I87001 Postthrombotic syndrome without complications of right lower extremity: Secondary | ICD-10-CM | POA: Insufficient documentation

## 2022-03-29 DIAGNOSIS — I82441 Acute embolism and thrombosis of right tibial vein: Secondary | ICD-10-CM | POA: Insufficient documentation

## 2022-03-29 DIAGNOSIS — Z9079 Acquired absence of other genital organ(s): Secondary | ICD-10-CM | POA: Insufficient documentation

## 2022-03-29 DIAGNOSIS — Z87891 Personal history of nicotine dependence: Secondary | ICD-10-CM | POA: Diagnosis not present

## 2022-03-29 LAB — COMPREHENSIVE METABOLIC PANEL
ALT: 32 U/L (ref 0–44)
AST: 37 U/L (ref 15–41)
Albumin: 4.6 g/dL (ref 3.5–5.0)
Alkaline Phosphatase: 32 U/L — ABNORMAL LOW (ref 38–126)
Anion gap: 7 (ref 5–15)
BUN: 26 mg/dL — ABNORMAL HIGH (ref 8–23)
CO2: 28 mmol/L (ref 22–32)
Calcium: 9.6 mg/dL (ref 8.9–10.3)
Chloride: 103 mmol/L (ref 98–111)
Creatinine, Ser: 1.51 mg/dL — ABNORMAL HIGH (ref 0.61–1.24)
GFR, Estimated: 47 mL/min — ABNORMAL LOW (ref >60)
Glucose, Bld: 89 mg/dL (ref 70–99)
Potassium: 3.7 mmol/L (ref 3.5–5.1)
Sodium: 138 mmol/L (ref 135–145)
Total Bilirubin: 0.8 mg/dL (ref 0.3–1.2)
Total Protein: 7.6 g/dL (ref 6.5–8.1)

## 2022-03-29 LAB — CBC WITH DIFFERENTIAL/PLATELET
Abs Immature Granulocytes: 0.02 10*3/uL (ref 0.00–0.07)
Basophils Absolute: 0.1 10*3/uL (ref 0.0–0.1)
Basophils Relative: 1 %
Eosinophils Absolute: 0.1 10*3/uL (ref 0.0–0.5)
Eosinophils Relative: 2 %
HCT: 44.3 % (ref 39.0–52.0)
Hemoglobin: 15.5 g/dL (ref 13.0–17.0)
Immature Granulocytes: 0 %
Lymphocytes Relative: 33 %
Lymphs Abs: 2.3 10*3/uL (ref 0.7–4.0)
MCH: 31 pg (ref 26.0–34.0)
MCHC: 35 g/dL (ref 30.0–36.0)
MCV: 88.6 fL (ref 80.0–100.0)
Monocytes Absolute: 0.8 10*3/uL (ref 0.1–1.0)
Monocytes Relative: 11 %
Neutro Abs: 3.6 10*3/uL (ref 1.7–7.7)
Neutrophils Relative %: 53 %
Platelets: 250 10*3/uL (ref 150–400)
RBC: 5 MIL/uL (ref 4.22–5.81)
RDW: 13 % (ref 11.5–15.5)
WBC: 6.9 10*3/uL (ref 4.0–10.5)
nRBC: 0 % (ref 0.0–0.2)

## 2022-03-29 NOTE — Assessment & Plan Note (Signed)
Recommend leg elevation and compression stocking.

## 2022-03-29 NOTE — Assessment & Plan Note (Addendum)
I agree with anticoagulation with Eliquis 5 mg twice daily. Recommend 3 to 6 months of therapeutic anticoagulation followed by prophylactic Eliquis 2.5 mg twice daily given the unprovoked nature. Check hypercoagulable work-up. I will check prothrombin gene mutation, factor V Leiden mutation, CBC CMP today. Additional other hypercoagulable work-up I will check at next visit. Repeat right lower extremity ultrasound in October 2023. Patient reports being up-to-date for his age appropriate cancer screening.

## 2022-03-29 NOTE — Progress Notes (Signed)
Hematology/Oncology Consult note [phone] [phone]         Patient Care Team: Pleas Koch, NP as PCP - General (Internal Medicine) Payton Spark as Consulting Physician (Dentistry) Ralene Bathe, MD (Dermatology)  REFERRING PROVIDER: Pleas Koch, NP   CHIEF COMPLAINTS/REASON FOR VISIT:  Evaluation of right lower extremity DVT  HISTORY OF PRESENTING ILLNESS:   Curtis Black. is a  81 y.o.  male with PMH listed below was seen in consultation at the request of  Pleas Koch, NP  for evaluation of right lower extremity DVT Patient has chronic back pain with intermittent right swelling pain.  He follows up with neurosurgeon.   03/02/2022, patient was seen by primary care provider.  He has noticed his chronic right lower extremity swelling worse.  Ultrasound was ordered for further evaluation.  Same day lower extremity ultrasound showed DVT of the posterior tibial vein,  Patient has history of prostate cancer, diagnosed in 2009 recent PSA is undetectable. Patient has been started on Eliquis and currently on Eliquis 5 mg twice daily.  He tolerates well.  Denies any bleeding events.  Right lower extremity swelling is slightly better however not completely resolved.  Patient denies any family history of VTE or personal history  of previous VTE.  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   CKD (chronic kidney disease) stage 3, GFR 30-59 ml/min (HCC)    Essential hypertension    Hyperlipidemia    Prostate cancer ([place]) 2009    Psoriasis     SURGICAL HISTORY: Past Surgical History:  Procedure Laterality Date   APPENDECTOMY     BOWEL RESECTION     CATARACT EXTRACTION W/PHACO Right 01/05/2021   Procedure: CATARACT EXTRACTION PHACO AND INTRAOCULAR LENS PLACEMENT ([place]) RIGHT OMIDRIA;  Surgeon: Leandrew Koyanagi, MD;  Location: [facility];  Service: Ophthalmology;  Laterality: Right;  cde 5.46 01:01.7 minutes 8.9%   CATARACT EXTRACTION W/PHACO Left 01/26/2021   Procedure: CATARACT EXTRACTION PHACO AND INTRAOCULAR LENS PLACEMENT ([place]) LEFT OMIDRIA 5.35 00:50.6;  Surgeon: Leandrew Koyanagi, MD;  Location: [place];  Service: Ophthalmology;  Laterality: Left;   COLON SURGERY     COLONOSCOPY WITH PROPOFOL N/A 06/05/2017   Procedure: COLONOSCOPY WITH PROPOFOL;  Surgeon: Jonathon Bellows, MD;  [facility] END[facility]COPY;  Service: Gastroenterology;  Laterality: N/A;   COLONOSCOPY WITH PROPOFOL N/A 06/15/2020   Procedure: COLONOSCOPY WITH PROPOFOL;  Surgeon: Jonathon Bellows, MD;  Location[facility]COPY;  Service: Gastroenterology;  Laterality: N/A;   COLONOSCOPY WITH PROPOFOL N/A 11/16/2020   Procedure: COLONOSCOPY WITH PROPOFOL;  Surgeon: Jonathon Bellows, MD;  Location: [facility] ENDOSCOPY;  Service: Gastroenterology;  Laterality: N/A;   KNEE ARTHROSCOPY Right    PROSTATECTOMY     thyroid biop      SOCIAL HISTORY: Social History   Socioeconomic History   Marital status: Married    Spouse name: Not on file   Number of children: Not on file   Years of education: Not on file   Highest education level: Not on file  Occupational  History   Not on file  Tobacco Use   Smoking status: Former    Packs/day: 2.00    Years: 15.00    Total pack years: 30.00    Types: Cigarettes    Quit date: 61    Years since quitting: 25.6   Smokeless tobacco: Never  Vaping Use   Vaping Use: Never used  Substance and Sexual Activity   Alcohol use: Yes    Alcohol/week: 7.0 standard drinks of alcohol    Types: 7 Glasses of  wine per week    Comment: occasionally   Drug use: Not Currently   Sexual activity: Not Currently  Other Topics Concern   Not on file  Social History Narrative   Married.   No children.   Retired. Once worked for [employer].   Enjoys working on his house, playing golf, riding his bike.    Social Determinants of Health   Financial Resource Strain: Not on file  Food Insecurity: Not on file  Transportation Needs: Not on file  Physical Activity: Not on file  Stress: Not on file  Social Connections: Not on file  Intimate Partner Violence: Not on file    FAMILY HISTORY: Family History  Problem Relation Age of Onset   Emphysema Father    Diabetes Sister     ALLERGIES:  is allergic to lidocaine.  MEDICATIONS:  Current Outpatient Medications  Medication Sig Dispense Refill   amLODipine (NORVASC) 5 MG tablet Take 5 mg by mouth daily.     apixaban (ELIQUIS) 5 MG TABS tablet Take 1 tablet (5 mg total) by mouth 2 (two) times daily. 60 tablet 1   atorvastatin (LIPITOR) 20 MG tablet TAKE 1 TABLET DAILY FOR    CHOLESTEROL 90 tablet 2   Coenzyme Q10 (COQ10 PO) Take 1 capsule by mouth daily.      fenofibrate micronized (LOFIBRA) 134 MG capsule TAKE 1 CAPSULE DAILY BEFOREBREAKFAST FOR CHOLESTEROL 90 capsule 2   GLUCOSAMINE SULFATE PO Take by mouth.     losartan-hydrochlorothiazide (HYZAAR) 100-12.5 MG tablet Take 1 tablet by mouth daily. for blood pressure. 90 tablet 1   Multiple Vitamin (MULTIVITAMIN) tablet Take 1 tablet by mouth daily.     Omega-3 Fatty Acids (FISH OIL) 1000 MG CAPS Take 1,000 mg by mouth 2 (two) times daily.     sertraline (ZOLOFT) 50 MG tablet Take 1 tablet (50 mg total) by mouth daily. For anxiety. 90 tablet 1   triamcinolone cream (KENALOG) 0.1 % Apply 1 application. topically See admin instructions. Use twice daily for  5 days weekly as needed to affected areas of body for psoriasis. Avoid applying to face, groin, and axilla. Use as directed. 80 g 2   No current  facility-administered medications for this visit.    Review of Systems  Constitutional:  Negative for appetite change, chills, fatigue, fever and unexpected weight change.  HENT:   Negative for hearing loss and voice change.   Eyes:  Negative for eye problems and icterus.  Respiratory:  Negative for chest tightness, cough and shortness of breath.   Cardiovascular:  Positive for leg swelling. Negative for chest pain.  Gastrointestinal:  Negative for abdominal distention and abdominal pain.  Endocrine: Negative for hot flashes.  Genitourinary:  Negative for difficulty urinating, dysuria and frequency.   Musculoskeletal:  Negative for arthralgias.  Skin:  Negative for itching and rash.  Neurological:  Negative for light-headedness and numbness.  Hematological:  Negative for adenopathy. Does not bruise/bleed easily.  Psychiatric/Behavioral:  Negative  for confusion.    PHYSICAL EXAMINATION: ECOG PERFORMANCE STATUS: 0 - Asymptomatic Vitals:   03/29/22 0934  BP: (!) 153/78  Pulse: (!) 49  Resp: 18  Temp: 97.8 F (36.6 C)   Filed Weights   03/29/22 0934  Weight: 241 lb 6.4 oz (109.5 kg)    Physical Exam Constitutional:      General: He is not in acute distress. HENT:     Head: Normocephalic and atraumatic.  Eyes:     General: No scleral icterus. Cardiovascular:     Rate and Rhythm: Normal rate and regular rhythm.     Heart sounds: Normal heart sounds.  Pulmonary:     Effort: Pulmonary effort is normal. No respiratory distress.     Breath sounds: No wheezing.  Abdominal:     General: Bowel sounds are normal. There is no distension.     Palpations: Abdomen is soft.  Musculoskeletal:        General: No deformity. Normal range of motion.     Cervical back: Normal range of motion and neck supple.     Right lower leg: Edema present.  Skin:    General: Skin is warm and dry.     Findings: No erythema or rash.  Neurological:     Mental Status: He is alert and oriented to  person, place, and time. Mental status is at baseline.     Cranial Nerves: No cranial nerve deficit.     Coordination: Coordination normal.  Psychiatric:        Mood and Affect: Mood normal.     LABORATORY DATA:  I have reviewed the data as listed    Latest Ref Rng & Units 03/29/2022   10:03 AM 05/31/2021   10:08 AM 05/25/2020    8:03 AM  CBC  WBC 4.0 - 10.5 K/uL 6.9  5.2  5.1   Hemoglobin 13.0 - 17.0 g/dL 15.5  16.1  15.5   Hematocrit 39.0 - 52.0 % 44.3  47.8  45.4   Platelets 150 - 400 K/uL 250  234.0  251.0       Latest Ref Rng & Units 03/29/2022   10:03 AM 03/02/2022    9:32 AM 01/19/2022    8:00 AM  CMP  Glucose 70 - 99 mg/dL 89  102  97   BUN 8 - 23 mg/dL '26  23  30   '$ Creatinine 0.61 - 1.24 mg/dL 1.51  1.40  1.59   Sodium 135 - 145 mmol/L 138  138  141   Potassium 3.5 - 5.1 mmol/L 3.7  3.9  3.7   Chloride 98 - 111 mmol/L 103  102  101   CO2 22 - 32 mmol/L '28  27  29   '$ Calcium 8.9 - 10.3 mg/dL 9.6  9.9  10.1   Total Protein 6.5 - 8.1 g/dL 7.6     Total Bilirubin 0.3 - 1.2 mg/dL 0.8     Alkaline Phos 38 - 126 U/L 32     AST 15 - 41 U/L 37     ALT 0 - 44 U/L 32         RADIOGRAPHIC STUDIES: I have personally reviewed the radiological images as listed and agreed with the findings in the report. US Venous Img Lower Unilateral Right (DVT)  Result Date: 03/02/2022 CLINICAL DATA:  81 year old male with a history of edema right lower extremity EXAM: RIGHT LOWER EXTREMITY VENOUS DOPPLER ULTRASOUND TECHNIQUE: Gray-scale sonography with graded compression, as well as color Doppler  and duplex ultrasound were performed to evaluate the lower extremity deep venous systems from the level of the common femoral vein and including the common femoral, femoral, profunda femoral, popliteal and calf veins including the posterior tibial, peroneal and gastrocnemius veins when visible. The superficial great saphenous vein was also interrogated. Spectral Doppler was utilized to evaluate flow at rest  and with distal augmentation maneuvers in the common femoral, femoral and popliteal veins. COMPARISON:  None Available. FINDINGS: Contralateral Common Femoral Vein: Respiratory phasicity is normal and symmetric with the symptomatic side. No evidence of thrombus. Normal compressibility. Common Femoral Vein: No evidence of thrombus. Normal compressibility, respiratory phasicity and response to augmentation. Saphenofemoral Junction: No evidence of thrombus. Normal compressibility and flow on color Doppler imaging. Profunda Femoral Vein: No evidence of thrombus. Normal compressibility and flow on color Doppler imaging. Femoral Vein: No evidence of thrombus. Normal compressibility, respiratory phasicity and response to augmentation. Popliteal Vein: No evidence of thrombus. Normal compressibility, respiratory phasicity and response to augmentation. Calf Veins: Occlusive thrombus posterior tibial vein. Peroneal vein patent and compressible with flow. Superficial Great Saphenous Vein: No evidence of thrombus. Normal compressibility and flow on color Doppler imaging. Other Findings:  None. IMPRESSION: Directed duplex right lower extremity positive for DVT of the posterior tibial vein. Negative for proximal DVT. Signed, Dulcy Fanny. Nadene Rubins, RPVI [facility] Radiology Electronically Signed   By: Corrie Mckusick D.O.   On: 03/02/2022 11:52   MR Lumbar Spine Wo Contrast  Result Date: 03/02/2022 CLINICAL DATA:  Low back pain with right gluteal and leg pain with numbness in the right foot EXAM: MRI LUMBAR SPINE WITHOUT CONTRAST TECHNIQUE: Multiplanar, multisequence MR imaging of the lumbar spine was performed. No intravenous contrast was administered. COMPARISON:  None Available. FINDINGS: Segmentation:  5 lumbar type vertebrae Alignment:  Physiologic. Vertebrae: No fracture, evidence of discitis, or focal bone lesion. Heterogeneity of marrow usually attributed to aging.  Conus medullaris and cauda equina: Conus extends to the L1-2 level. Conus and cauda equina appear normal. Paraspinal and other soft tissues: Renal cystic intensities bilaterally. Colonic diverticulosis. Disc levels: L2-L3: Disc narrowing and endplate degeneration with circumferential bulging and ridging. Moderate spinal stenosis. Patent foramina L3-L4: Disc narrowing and bulging with mild facet spurring. Mild triangular narrowing of the thecal sac L4-L5: Disc narrowing and bulging with right paracentral downward migrating extrusion which compresses the right L5 nerve root. Degenerative facet spurring and ligamentum flavum thickening. Right more than left foraminal stenosis which is at least moderate. Moderate spinal stenosis separate from the herniation. L5-S1:Mild disc bulging and facet spurring. Asymmetric left facet spurring encroaching on the exiting left L5 nerve root. IMPRESSION: 1. 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 2. 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 Electronically Signed   By: Jorje Guild M.D.   On: 03/02/2022 11:50   DG Lumbar Spine Complete  Result Date: 01/20/2022 CLINICAL DATA:  Low back pain, right radiculopathy EXAM: LUMBAR SPINE - COMPLETE 4+ VIEW COMPARISON:  None Available. FINDINGS: No fracture is seen. Degenerative changes are noted with bony spurs and facet hypertrophy at multiple levels. There is narrowing of disc space at L2-L3 and L5-S1 levels. There is minimal retrolisthesis at L2-L3 level. Arterial calcifications are seen. IMPRESSION: No recent fracture is seen in the lumbar spine. Degenerative changes are noted at multiple levels as described in the body of the report. There is minimal  retrolisthesis at L2-L3 level. Electronically Signed   By: Elmer Picker M.D.   On: 01/20/2022 14:02       ASSESSMENT & PLAN:   Acute deep  vein thrombosis (DVT) of right lower extremity ([place]) I agree with anticoagulation with Eliquis 5 mg twice daily. Recommend 3 to 6 months of therapeutic anticoagulation followed by prophylactic Eliquis 2.5 mg twice daily given the unprovoked nature. Check hypercoagulable work-up. I will check prothrombin gene mutation, factor V Leiden mutation, CBC CMP today. Additional other hypercoagulable work-up I will check at next visit. Repeat right lower extremity ultrasound in October 2023. Patient reports being up-to-date for his age appropriate cancer screening.  Post-thrombotic syndrome of right lower extremity Recommend leg elevation and compression stocking.   Orders Placed This Encounter  Procedures   US Venous Img Lower Unilateral Right    Standing Status:   Future    Standing Expiration Date:   03/29/2023    Order Specific Question:   Reason for Exam (SYMPTOM  OR DIAGNOSIS REQUIRED)    Answer:   DVT follow up    Order Specific Question:   Preferred imaging location?    Answer:   [facility]   CBC with Differential/Platelet    Standing Status:   Future    Number of Occurrences:   1    Standing Expiration Date:   03/29/2023   Comprehensive metabolic panel    Standing Status:   Future    Number of Occurrences:   1    Standing Expiration Date:   03/30/2023   Factor 5 leiden    Standing Status:   Future    Number of Occurrences:   1    Standing Expiration Date:   03/30/2023   Prothrombin gene mutation    Standing Status:   Future    Number of Occurrences:   1    Standing Expiration Date:   03/30/2023   Repeat ultrasound in October 2023, follow-up after that. All questions were answered. The patient knows to call the clinic with any problems, questions or concerns.  Pleas Koch, NP   Thank you for this kind referral and the opportunity to participate in the care of this patient. A copy of today's note is routed to referring provider   Earlie Server, MD, PhD [facility] Hematology  Oncology 03/29/2022

## 2022-04-03 LAB — FACTOR 5 LEIDEN

## 2022-04-05 ENCOUNTER — Other Ambulatory Visit: Payer: Medicare Other

## 2022-04-05 LAB — PROTHROMBIN GENE MUTATION

## 2022-04-13 ENCOUNTER — Telehealth: Payer: Self-pay

## 2022-04-13 NOTE — Telephone Encounter (Signed)
History and specimen tracking updated per Mohs progress note AF

## 2022-04-27 ENCOUNTER — Ambulatory Visit (INDEPENDENT_AMBULATORY_CARE_PROVIDER_SITE_OTHER): Payer: Medicare Other | Admitting: Neurosurgery

## 2022-04-27 DIAGNOSIS — M5416 Radiculopathy, lumbar region: Secondary | ICD-10-CM

## 2022-04-27 NOTE — Progress Notes (Signed)
I called [name] to review his response to PT and injections. He currently ended up doing physical therapy exercises on his own as he had a difficult time getting into the [facility] clinic for formal therapy.  He also decided to wait on undergoing an epidural as he is still on Eliquis for his DVT.  He states in the interim his right leg pain has improved significantly and is at a point where things are tolerable.  We discussed that should his symptoms return we would move forward with formal therapy and ESI's if indicated.  He will contact our office should he have any questions or concerns we would be more than happy to see him back.  Cooper Render PA-C

## 2022-05-07 ENCOUNTER — Other Ambulatory Visit: Payer: Self-pay | Admitting: Primary Care

## 2022-05-07 DIAGNOSIS — I82441 Acute embolism and thrombosis of right tibial vein: Secondary | ICD-10-CM

## 2022-05-07 NOTE — Telephone Encounter (Signed)
Dr. Tasia Catchings,  I reviewed your office notes for this patient from August 2023, thank you for seeing him! How long should he remain on the 5 mg dose of Eliquis? You mentioned 3-6 months, and thus far, he has been managed on Eliquis 5 mg BID for 3 months.   Please let me know. I can continue 5 mg BID for another 3 months or we can reduce to 2.5 mg BID.   Thanks! Allie Bossier, NP-C

## 2022-05-08 ENCOUNTER — Other Ambulatory Visit: Payer: Self-pay | Admitting: Oncology

## 2022-05-08 DIAGNOSIS — I82441 Acute embolism and thrombosis of right tibial vein: Secondary | ICD-10-CM

## 2022-05-08 MED ORDER — APIXABAN 5 MG PO TABS: 5.0000 mg | ORAL_TABLET | Freq: Two times a day (BID) | ORAL | 0 refills | Status: DC

## 2022-05-10 ENCOUNTER — Encounter: Payer: Self-pay | Admitting: Oncology

## 2022-05-16 ENCOUNTER — Other Ambulatory Visit: Payer: Self-pay | Admitting: Primary Care

## 2022-05-16 DIAGNOSIS — I10 Essential (primary) hypertension: Secondary | ICD-10-CM

## 2022-06-01 DIAGNOSIS — Z23 Encounter for immunization: Secondary | ICD-10-CM | POA: Diagnosis not present

## 2022-06-07 ENCOUNTER — Telehealth: Payer: Self-pay | Admitting: Primary Care

## 2022-06-07 NOTE — Telephone Encounter (Signed)
LVM for pt to rtn my call to schedule AWV with [facility] call back # [phone] 

## 2022-06-12 ENCOUNTER — Ambulatory Visit
Admission: RE | Admit: 2022-06-12 | Discharge: 2022-06-12 | Disposition: A | Discharge status: Home / Self Care | Payer: Medicare Other | Source: Ambulatory Visit | Attending: Oncology | Admitting: Oncology

## 2022-06-12 DIAGNOSIS — I82441 Acute embolism and thrombosis of right tibial vein: Secondary | ICD-10-CM | POA: Diagnosis not present

## 2022-06-13 ENCOUNTER — Ambulatory Visit (INDEPENDENT_AMBULATORY_CARE_PROVIDER_SITE_OTHER): Payer: Medicare Other | Admitting: *Deleted

## 2022-06-13 DIAGNOSIS — Z0000 Encounter for general adult medical examination without abnormal findings: Secondary | ICD-10-CM | POA: Diagnosis not present

## 2022-06-13 NOTE — Progress Notes (Signed)
Subjective:   Curtis Black. is a 81 y.o. male who presents for Medicare Annual/Subsequent preventive examination.  I connected with  [name] on 06/13/22 by a telephone enabled telemedicine application and verified that I am speaking with the correct person using two identifiers.   I discussed the limitations of evaluation and management by telemedicine. The patient expressed understanding and agreed to proceed.  Patient location: home  Provider location: Tele-health-home    Review of Systems     Cardiac Risk Factors include: advanced age (>27mn, >>52women);male gender;hypertension;obesity (BMI >30kg/m2)     Objective:    Today's Vitals   There is no height or weight on file to calculate BMI.     06/13/2022   12:01 PM 03/29/2022    9:27 AM 01/26/2021   12:32 PM 01/05/2021   11:20 AM 11/16/2020    9:41 AM 06/15/2020    7:37 AM 05/25/2020    2:02 PM  Advanced Directives  Does Patient Have a Medical Advance Directive? Yes Yes Yes Yes Yes Yes Yes  Type of [place]aramed[place]of [place] will [place] will [place]Living will [place]ned[place] ParkLiving will [place]hatchiLiving will  Does patient want to make changes to medical advance directive?   No - Guardian declined No - Patient declined     Copy of HLaceyin Chart? Yes - validated most recent copy scanned in chart (See row information)  Yes - validated most recent copy scanned in chart (See row information) No - copy requested No - copy requested  No - copy requested  Would patient like information on creating a medical advance directive? No - Patient declined          Current Medications (verified) Outpatient Encounter Medications as of 06/13/2022  Medication Sig   amLODipine (NORVASC) 5 MG tablet Take 5 mg by mouth daily.   apixaban (ELIQUIS) 5 MG TABS tablet  Take 1 tablet (5 mg total) by mouth 2 (two) times daily.   atorvastatin (LIPITOR) 20 MG tablet TAKE 1 TABLET DAILY FOR    CHOLESTEROL   Coenzyme Q10 (COQ10 PO) Take 1 capsule by mouth daily.    fenofibrate micronized (LOFIBRA) 134 MG capsule TAKE 1 CAPSULE DAILY BEFOREBREAKFAST FOR CHOLESTEROL   GLUCOSAMINE SULFATE PO Take by mouth.   losartan-hydrochlorothiazide (HYZAAR) 100-12.5 MG tablet Take 1 tablet by mouth daily. for blood pressure.   Multiple Vitamin (MULTIVITAMIN) tablet Take 1 tablet by mouth daily.   Omega-3 Fatty Acids (FISH OIL) 1000 MG CAPS Take 1,000 mg by mouth 2 (two) times daily.   sertraline (ZOLOFT) 50 MG tablet Take 1 tablet (50 mg total) by mouth daily. For anxiety.   triamcinolone cream (KENALOG) 0.1 % Apply 1 application. topically See admin instructions. Use twice daily for  5 days weekly as needed to affected areas of body for psoriasis. Avoid applying to face, groin, and axilla. Use as directed.   No facility-administered encounter medications on file as of 06/13/2022.    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   CKD (chronic kidney disease) stage 3, GFR 30-59 ml/min (HCC)    Essential hypertension    Hyperlipidemia    Prostate cancer ([place]) 2009   Psoriasis    Past Surgical History:  Procedure Laterality Date   APPENDECTOMY     BOWEL RESECTION     CATARACT EXTRACTION W/PHACO Right 01/05/2021   Procedure: CATARACT EXTRACTION PHACO AND INTRAOCULAR LENS PLACEMENT ([place]) RIGHT OMIDRIA;  Surgeon: Leandrew Koyanagi, MD;  Location: [facility];  Service: Ophthalmology;  Laterality: Right;   cde 5.46 01:01.7 minutes 8.9%   CATARACT EXTRACTION W/PHACO Left 01/26/2021   Procedure: CATARACT EXTRACTION PHACO AND INTRAOCULAR LENS PLACEMENT ([place]) LEFT OMIDRIA 5.35 00:50.6;  Surgeon: Leandrew Koyanagi, MD;  Location: [facility];  Service: Ophthalmology;  Laterality: Left;   COLON SURGERY     COLONOSCOPY WITH PROPOFOL N/A 06/05/2017   Procedure: COLONOSCOPY WITH PROPOFOL;  Surgeon: Jonathon Bellows, MD;  Location[facility] ENDOSCOPY;  Service: Gastroenterology;  Laterality: N/A;   COLONOSCOPY WITH PROPOFOL N/A 06/15/2020   Procedure: COLONOSCOPY WITH PROPOFOL;  Surgeon: Jonathon Bellows, MD;  Location: [facility] ENDOSCOPY;  Service: Gastroenterology;  Laterality: N/A;   COLONOSCOPY WITH PROPOFOL N/A 11/16/2020   Procedure: COLONOSCOPY WITH PROPOFOL;  Surgeon: Jonathon Bellows, MD;  Location[facility] ENDOSCOPY;  Service: Gastroenterology;  Laterality: N/A;   KNEE ARTHROSCOPY Right    PROSTATECTOMY     thyroid biop     Family History  Problem Relation Age of Onset   Emphysema Father    Diabetes Sister    Social History   Socioeconomic History   Marital status: Married    Spouse name: Not on file   Number of children: Not on file   Years of education: Not on file   Highest education level: Not on file  Occupational History   Not on file  Tobacco Use   Smoking status: Former    Packs/day: 2.00    Years: 15.00    Total pack years: 30.00    Types: Cigarettes    Quit date: 66    Years since quitting: 25.8   Smokeless tobacco: Never  Vaping Use   Vaping Use: Never used  Substance and Sexual Activity   Alcohol use: Yes    Alcohol/week: 7.0 standard drinks of alcohol    Types: 7 Glasses of wine per week    Comment: occasionally   Drug use: Not Currently   Sexual activity: Not Currently  Other Topics Concern   Not on file  Social History Narrative   Married.   No children.   Retired. Once worked for [employer].   Enjoys working on his house, playing golf, riding his bike.    Social  Determinants of Health   Financial Resource Strain: Low Risk  (06/13/2022)   Overall Financial Resource Strain (CARDIA)    Difficulty of Paying Living Expenses: Not hard at all  Food Insecurity: No Food Insecurity (06/13/2022)   Hunger Vital Sign    Worried About Running Out of Food in the Last Year: Never true    Ran Out of Food in the Last Year: Never true  Transportation Needs: No Transportation Needs (06/13/2022)   PRAPARE - Hydrologist (Medical): No    Lack of Transportation (Non-Medical): No  Physical Activity: Sufficiently Active (06/13/2022)   Exercise Vital Sign    Days of Exercise per Week: 4 days    Minutes of Exercise per Session: 40 min  Stress: No  Stress Concern Present (06/13/2022)   [place]    Feeling of Stress : Not at all  Social Connections: Moderately Integrated (06/13/2022)   Social Connection and Isolation Panel [NHANES]    Frequency of Communication with Friends and Family: More than three times a week    Frequency of Social Gatherings with Friends and Family: More than three times a week    Attends Religious Services: Never    Marine scientist or Organizations: No    Attends Music therapist: More than 4 times per year    Marital Status: Married    Tobacco Counseling Counseling given: Not Answered   Clinical Intake:  Pre-visit preparation completed: Yes  Pain : No/denies pain     Diabetes: No  How often do you need to have someone help you when you read instructions, pamphlets, or other written materials from your doctor or pharmacy?: 1 - Never  Diabetic?  no  Interpreter Needed?: No  Information entered by :: Leroy Kennedy LPN   Activities of Daily Living    06/13/2022   12:11 PM  In your present state of health, do you have any difficulty performing the following activities:  Hearing? 0  Vision? 0  Difficulty  concentrating or making decisions? 0  Walking or climbing stairs? 0  Dressing or bathing? 0  Doing errands, shopping? 0  Preparing Food and eating ? N  Using the Toilet? N  In the past six months, have you accidently leaked urine? N  Do you have problems with loss of bowel control? N  Managing your Medications? N  Managing your Finances? N  Housekeeping or managing your Housekeeping? N    Patient Care Team: Pleas Koch, NP as PCP - General (Internal Medicine) Payton Spark as Consulting Physician (Dentistry) Ralene Bathe, MD (Dermatology)  Indicate any recent Medical Services you may have received from other than [facility] providers in the past year (date may be approximate).     Assessment:   This is a routine wellness examination for Jashun.  Hearing/Vision screen Hearing Screening - Comments:: No trouble hearing Vision Screening - Comments:: Up to date [facility]  Dietary issues and exercise activities discussed: Current Exercise Habits: Home exercise routine, Type of exercise: walking (golf), Time (Minutes): 40, Frequency (Times/Week): 4, Weekly Exercise (Minutes/Week): 160, Intensity: Moderate   Goals Addressed             This Visit's Progress    Patient Stated       Continue current lifestyle        Depression Screen    06/13/2022   12:08 PM 05/31/2021    9:36 AM 05/25/2020    2:04 PM 05/15/2019    7:39 AM 04/19/2018    8:19 AM 03/30/2017    8:08 AM  PHQ 2/9 Scores  PHQ - 2 Score 0 0 0 0 0 0  PHQ- 9 Score 0  0  0     Fall Risk    06/13/2022   12:02 PM 05/31/2021    9:35 AM 05/25/2020    2:03 PM 05/15/2019    7:39 AM 04/19/2018    8:19 AM  Fall Risk   Falls in the past year? 0 0 0 0 No  Number falls in past yr: 0 0 0 0   Injury with Fall? 0 0 0 0   Risk for fall due to :   Medication side effect  Follow up Falls evaluation completed;Education provided;Falls prevention discussed  Falls evaluation completed;Falls prevention discussed       FALL RISK PREVENTION PERTAINING TO THE HOME:  Any stairs in or around the home? Yes  If so, are there any without handrails? No  Home free of loose throw rugs in walkways, pet beds, electrical cords, etc? Yes  Adequate lighting in your home to reduce risk of falls? Yes   ASSISTIVE DEVICES UTILIZED TO PREVENT FALLS:  Life alert? No  Use of a cane, walker or w/c? No  Grab bars in the bathroom? Yes  Shower chair or bench in shower? Yes  Elevated toilet seat or a handicapped toilet? No   TIMED UP AND GO:  Was the test performed? No .    Cognitive Function:    05/25/2020    2:09 PM 04/19/2018    8:19 AM 03/30/2017    8:09 AM  MMSE - Mini Mental State Exam  Orientation to time '5 5 5  '$ Orientation to Place '5 5 5  '$ Registration '3 3 3  '$ Attention/ Calculation 5 0 0  Recall '3 3 3  '$ Language- name 2 objects  0 0  Language- repeat '1 1 1  '$ Language- follow 3 step command  3 3  Language- read & follow direction  0 0  Write a sentence  0 0  Copy design  0 0  Total score  20 20        06/13/2022   12:03 PM  6CIT Screen  What Year? 0 points  What month? 0 points  What time? 0 points  Count back from 20 0 points  Months in reverse 0 points  Repeat phrase 0 points  Total Score 0 points    Immunizations Immunization History  Administered Date(s) Administered   Fluad Quad(high Dose 65+) 05/09/2019, 06/10/2021, 06/01/2022   Influenza, High Dose Seasonal PF 05/26/2014, 06/13/2017, 05/28/2018, 05/13/2019, 05/19/2020   PFIZER Comirnaty(Gray Top)Covid-19 Tri-Sucrose Vaccine 12/30/2020   PFIZER(Purple Top)SARS-COV-2 Vaccination 10/02/2019, 10/23/2019, 06/08/2020   Palivizumab 04/17/2022   Pfizer Covid-19 Vaccine Bivalent Booster 50yr & up 06/23/2021   Pneumococcal Conjugate-13 04/06/2017   Pneumococcal Polysaccharide-23 01/05/2010, 06/18/2018, 04/29/2019   Tdap 11/13/2017   Zoster Recombinat (Shingrix) 05/28/2018, 08/26/2018, 04/29/2019   Zoster, Live 08/28/2014    TDAP  status: Up to date  Flu Vaccine status: Up to date  Pneumococcal vaccine status: Up to date  Covid-19 vaccine status: Completed vaccines  Qualifies for Shingles Vaccine? No   Zostavax completed Yes   Shingrix Completed?: Yes  Screening Tests Health Maintenance  Topic Date Due   COVID-19 Vaccine (6 - Pfizer risk series) 08/18/2021   COLONOSCOPY (Pts 45-440yrInsurance coverage will need to be confirmed)  11/17/2023   TETANUS/TDAP  11/14/2027   Pneumonia Vaccine 6590Years old  Completed   INFLUENZA VACCINE  Completed   Hepatitis C Screening  Completed   Zoster Vaccines- Shingrix  Completed   HPV VACCINES  Aged Out    Health Maintenance  Health Maintenance Due  Topic Date Due   COVID-19 Vaccine (6 - Pfizer risk series) 08/18/2021    Colorectal cancer screening: Type of screening: Colonoscopy. Completed 2022. Repeat every 3 years  Lung Cancer Screening: (Low Dose CT Chest recommended if Age 79-80ears, 30 pack-year currently smoking OR have quit w/in 15years.) does not qualify.   Lung Cancer Screening Referral:   Additional Screening:  Hepatitis C Screening: does not qualify; Completed 2021  Vision Screening: Recommended annual ophthalmology exams for early detection  of glaucoma and other disorders of the eye. Is the patient up to date with their annual eye exam?  Yes  Who is the provider or what is the name of the office in which the patient attends annual eye exams? [facility] If pt is not established with a provider, would they like to be referred to a provider to establish care? No .   Dental Screening: Recommended annual dental exams for proper oral hygiene  Community Resource Referral / Chronic Care Management: CRR required this visit?  No   CCM required this visit?  No      Plan:     I have personally reviewed and noted the following in the patient's chart:   Medical and social history Use of alcohol, tobacco or illicit drugs  Current medications  and supplements including opioid prescriptions. Patient is not currently taking opioid prescriptions. Functional ability and status Nutritional status Physical activity Advanced directives List of other physicians Hospitalizations, surgeries, and ER visits in previous 12 months Vitals Screenings to include cognitive, depression, and falls Referrals and appointments  In addition, I have reviewed and discussed with patient certain preventive protocols, quality metrics, and best practice recommendations. A written personalized care plan for preventive services as well as general preventive health recommendations were provided to patient.     Leroy Kennedy, LPN   14/23/9532   Nurse Notes:

## 2022-06-13 NOTE — Patient Instructions (Signed)
[name] , Thank you for taking time to come for your Medicare Wellness Visit. I appreciate your ongoing commitment to your health goals. Please review the following plan we discussed and let me know if I can assist you in the future.   These are the goals we discussed:  Goals      Exercise 6x per week (30 min per time)     Starting 04/19/2018, I will continue working out 6x per week for 1.5-5 hours per day.       Patient Stated     05/25/2020, I will continue to play golf 3 days a week for 4-5 hours.     Patient Stated     Continue current lifestyle         This is a list of the screening recommended for you and due dates:  Health Maintenance  Topic Date Due   COVID-19 Vaccine (6 - Pfizer risk series) 08/18/2021   Colon Cancer Screening  11/17/2023   Tetanus Vaccine  11/14/2027   Pneumonia Vaccine  Completed   Flu Shot  Completed   Hepatitis C Screening: USPSTF Recommendation to screen - Ages 18-79 yo.  Completed   Zoster (Shingles) Vaccine  Completed   HPV Vaccine  Aged Out    Advanced directives: on file  Conditions/risks identified:     Preventive Care 12 Years and Older, Male  Preventive care refers to lifestyle choices and visits with your health care provider that can promote health and wellness. What does preventive care include? A yearly physical exam. This is also called an annual well check. Dental exams once or twice a year. Routine eye exams. Ask your health care provider how often you should have your eyes checked. Personal lifestyle choices, including: Daily care of your teeth and gums. Regular physical activity. Eating a healthy diet. Avoiding tobacco and drug use. Limiting alcohol use. Practicing safe sex. Taking low doses of aspirin every day. Taking vitamin and mineral supplements as recommended by your health care provider. What happens during an annual well check? The services and screenings done by your health care provider during your annual  well check will depend on your age, overall health, lifestyle risk factors, and family history of disease. Counseling  Your health care provider may ask you questions about your: Alcohol use. Tobacco use. Drug use. Emotional well-being. Home and relationship well-being. Sexual activity. Eating habits. History of falls. Memory and ability to understand (cognition). Work and work Statistician. Screening  You may have the following tests or measurements: Height, weight, and BMI. Blood pressure. Lipid and cholesterol levels. These may be checked every 5 years, or more frequently if you are over 72 years old. Skin check. Lung cancer screening. You may have this screening every year starting at age 30 if you have a 30-pack-year history of smoking and currently smoke or have quit within the past 15 years. Fecal occult blood test (FOBT) of the stool. You may have this test every year starting at age 58. Flexible sigmoidoscopy or colonoscopy. You may have a sigmoidoscopy every 5 years or a colonoscopy every 10 years starting at age 87. Prostate cancer screening. Recommendations will vary depending on your family history and other risks. Hepatitis C blood test. Hepatitis B blood test. Sexually transmitted disease (STD) testing. Diabetes screening. This is done by checking your blood sugar (glucose) after you have not eaten for a while (fasting). You may have this done every 1-3 years. Abdominal aortic aneurysm (AAA) screening. You may need  this if you are a current or former smoker. Osteoporosis. You may be screened starting at age 63 if you are at high risk. Talk with your health care provider about your test results, treatment options, and if necessary, the need for more tests. Vaccines  Your health care provider may recommend certain vaccines, such as: Influenza vaccine. This is recommended every year. Tetanus, diphtheria, and acellular pertussis (Tdap, Td) vaccine. You may need a Td booster  every 10 years. Zoster vaccine. You may need this after age 27. Pneumococcal 13-valent conjugate (PCV13) vaccine. One dose is recommended after age 33. Pneumococcal polysaccharide (PPSV23) vaccine. One dose is recommended after age 29. Talk to your health care provider about which screenings and vaccines you need and how often you need them. This information is not intended to replace advice given to you by your health care provider. Make sure you discuss any questions you have with your health care provider. Document Released: 09/10/2015 Document Revised: 05/03/2016 Document Reviewed: 06/15/2015 Elsevier Interactive Patient Education  2017 [place] Prevention in the Home Falls can cause injuries. They can happen to people of all ages. There are many things you can do to make your home safe and to help prevent falls. What can I do on the outside of my home? Regularly fix the edges of walkways and driveways and fix any cracks. Remove anything that might make you trip as you walk through a door, such as a raised step or threshold. Trim any bushes or trees on the path to your home. Use bright outdoor lighting. Clear any walking paths of anything that might make someone trip, such as rocks or tools. Regularly check to see if handrails are loose or broken. Make sure that both sides of any steps have handrails. Any raised decks and porches should have guardrails on the edges. Have any leaves, snow, or ice cleared regularly. Use sand or salt on walking paths during winter. Clean up any spills in your garage right away. This includes oil or grease spills. What can I do in the bathroom? Use night lights. Install grab bars by the toilet and in the tub and shower. Do not use towel bars as grab bars. Use non-skid mats or decals in the tub or shower. If you need to sit down in the shower, use a plastic, non-slip stool. Keep the floor dry. Clean up any water that spills on the floor as soon  as it happens. Remove soap buildup in the tub or shower regularly. Attach bath mats securely with double-sided non-slip rug tape. Do not have throw rugs and other things on the floor that can make you trip. What can I do in the bedroom? Use night lights. Make sure that you have a light by your bed that is easy to reach. Do not use any sheets or blankets that are too big for your bed. They should not hang down onto the floor. Have a firm chair that has side arms. You can use this for support while you get dressed. Do not have throw rugs and other things on the floor that can make you trip. What can I do in the kitchen? Clean up any spills right away. Avoid walking on wet floors. Keep items that you use a lot in easy-to-reach places. If you need to reach something above you, use a strong step stool that has a grab bar. Keep electrical cords out of the way. Do not use floor polish or wax that makes floors  slippery. If you must use wax, use non-skid floor wax. Do not have throw rugs and other things on the floor that can make you trip. What can I do with my stairs? Do not leave any items on the stairs. Make sure that there are handrails on both sides of the stairs and use them. Fix handrails that are broken or loose. Make sure that handrails are as long as the stairways. Check any carpeting to make sure that it is firmly attached to the stairs. Fix any carpet that is loose or worn. Avoid having throw rugs at the top or bottom of the stairs. If you do have throw rugs, attach them to the floor with carpet tape. Make sure that you have a light switch at the top of the stairs and the bottom of the stairs. If you do not have them, ask someone to add them for you. What else can I do to help prevent falls? Wear shoes that: Do not have high heels. Have rubber bottoms. Are comfortable and fit you well. Are closed at the toe. Do not wear sandals. If you use a stepladder: Make sure that it is fully  opened. Do not climb a closed stepladder. Make sure that both sides of the stepladder are locked into place. Ask someone to hold it for you, if possible. Clearly mark and make sure that you can see: Any grab bars or handrails. First and last steps. Where the edge of each step is. Use tools that help you move around (mobility aids) if they are needed. These include: Canes. Walkers. Scooters. Crutches. Turn on the lights when you go into a dark area. Replace any light bulbs as soon as they burn out. Set up your furniture so you have a clear path. Avoid moving your furniture around. If any of your floors are uneven, fix them. If there are any pets around you, be aware of where they are. Review your medicines with your doctor. Some medicines can make you feel dizzy. This can increase your chance of falling. Ask your doctor what other things that you can do to help prevent falls. This information is not intended to replace advice given to you by your health care provider. Make sure you discuss any questions you have with your health care provider. Document Released: 06/10/2009 Document Revised: 01/20/2016 Document Reviewed: 09/18/2014 Elsevier Interactive Patient Education  2017 [employer].

## 2022-06-15 ENCOUNTER — Inpatient Hospital Stay: Payer: Medicare Other | Attending: Oncology | Admitting: Oncology

## 2022-06-15 ENCOUNTER — Encounter: Payer: Self-pay | Admitting: Oncology

## 2022-06-15 VITALS — BP 134/82 | HR 57 | Temp 98.2°F | Resp 18 | Wt 245.3 lb

## 2022-06-15 DIAGNOSIS — I82441 Acute embolism and thrombosis of right tibial vein: Secondary | ICD-10-CM | POA: Diagnosis not present

## 2022-06-15 DIAGNOSIS — Z8546 Personal history of malignant neoplasm of prostate: Secondary | ICD-10-CM | POA: Diagnosis not present

## 2022-06-15 DIAGNOSIS — Z7901 Long term (current) use of anticoagulants: Secondary | ICD-10-CM | POA: Diagnosis not present

## 2022-06-15 DIAGNOSIS — Z9079 Acquired absence of other genital organ(s): Secondary | ICD-10-CM | POA: Diagnosis not present

## 2022-06-15 DIAGNOSIS — Z86718 Personal history of other venous thrombosis and embolism: Secondary | ICD-10-CM | POA: Insufficient documentation

## 2022-06-15 DIAGNOSIS — Z87891 Personal history of nicotine dependence: Secondary | ICD-10-CM | POA: Insufficient documentation

## 2022-06-15 MED ORDER — APIXABAN 2.5 MG PO TABS: 2.5000 mg | ORAL_TABLET | Freq: Two times a day (BID) | ORAL | 1 refills | Status: DC

## 2022-06-15 NOTE — Progress Notes (Signed)
Hematology/Oncology Progress note [phone] [phone]            Patient Care Team: Pleas Koch, NP as PCP - General (Internal Medicine) Payton Spark as Consulting Physician (Dentistry) Ralene Bathe, MD (Dermatology)  ASSESSMENT & PLAN:   History of DVT (deep vein thrombosis) S/p 3 months of therapeutic anticoagulation Negative  prothrombin gene mutation, factor V Leiden mutation, Oct 2023  right lower extremity ultrasound negative.  Recommend patient to decrease Eliquis to 2.'5mg'$  BID, long term maintenance.    Orders Placed This Encounter  Procedures   CBC with Differential/Platelet    Standing Status:   Future    Standing Expiration Date:   06/16/2023   Comprehensive metabolic panel    Standing Status:   Future    Standing Expiration Date:   06/15/2023   Follow up in 6 months.  All questions were answered. The patient knows to call the clinic with any problems, questions or concerns.  Earlie Server, MD, PhD [facility] Hematology Oncology 06/15/2022    CHIEF COMPLAINTS/REASON FOR VISIT:  right lower extremity DVT  HISTORY OF PRESENTING ILLNESS:   Curtis Horton. is a  81 y.o.  male with PMH listed below was seen in consultation at the request of  Pleas Koch, NP  for evaluation of right lower extremity DVT Patient has chronic back pain with intermittent right swelling pain.  He follows up with neurosurgeon.   03/02/2022, patient was seen by primary care provider.  He has noticed his chronic right lower extremity swelling worse.  Ultrasound was ordered for further evaluation.  Same day lower extremity ultrasound showed DVT of the posterior tibial vein,  Patient has history of prostate cancer, diagnosed in 2009 recent PSA is undetectable. Patient has been started on Eliquis and currently on Eliquis 5 mg twice daily.  He tolerates well.  Denies any bleeding events.  Right lower extremity swelling is slightly better however not  completely resolved.  Patient denies any family history of VTE or personal history  of previous VTE.  Denies any immobilization factors which may contribute to his DVT   INTERVAL HISTORY Curtis Toral. is a 81 y.o. male who has above history reviewed by me today presents for follow up visit for right lower extremity DVT Patient take Eliquis '5mg'$  BID. Right lower extremity swelling is better. Still have mild edema at the end of the day. No bleeding events.   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   CKD (chronic kidney disease) stage 3, GFR 30-59 ml/min (HCC)    Essential hypertension    Hyperlipidemia    Prostate cancer ([place]) 2009   Psoriasis     SURGICAL HISTORY: Past Surgical History:  Procedure Laterality Date   APPENDECTOMY     BOWEL RESECTION     CATARACT EXTRACTION W/PHACO Right 01/05/2021   Procedure: CATARACT EXTRACTION PHACO AND INTRAOCULAR LENS PLACEMENT ([place]) RIGHT OMIDRIA;  Surgeon: Leandrew Koyanagi, MD;  Location: [facility];  Service: Ophthalmology;  Laterality: Right;  cde 5.46 01:01.7 minutes 8.9%   CATARACT EXTRACTION [facility] Left 01/26/2021  Procedure: CATARACT EXTRACTION PHACO AND INTRAOCULAR LENS PLACEMENT ([place]) LEFT OMIDRIA 5.35 00:50.6;  Surgeon: Leandrew Koyanagi, MD;  Location: [facility];  Service: Ophthalmology;  Laterality: Left;   COLON SURGERY     COLONOSCOPY WITH PROPOFOL N/A 06/05/2017   Procedure: COLONOSCOPY WITH PROPOFOL;  Surgeon: Jonathon Bellows, MD;  Location: [facility] ENDOSCOPY;  Service: Gastroenterology;  Laterality: N/A;   COLONOSCOPY WITH PROPOFOL N/A 06/15/2020   Procedure:  COLONOSCOPY WITH PROPOFOL;  Surgeon: Jonathon Bellows, MD;  Location: [facility] ENDOSCOPY;  Service: Gastroenterology;  Laterality: N/A;   COLONOSCOPY WITH PROPOFOL N/A 11/16/2020   Procedure: COLONOSCOPY WITH PROPOFOL;  Surgeon: Jonathon Bellows, MD;  Location: [facility] ENDOSCOPY;  Service: Gastroenterology;  Laterality: N/A;   KNEE ARTHROSCOPY Right    PROSTATECTOMY     thyroid biop      SOCIAL HISTORY: Social History   Socioeconomic History   Marital status: Married    Spouse name: Not on file   Number of children: Not on file   Years of education: Not on file   Highest education level: Not on file  Occupational History   Not on file  Tobacco Use   Smoking status: Former    Packs/day: 2.00    Years: 15.00    Total pack years: 30.00    Types: Cigarettes    Quit date: 55    Years since quitting: 25.8   Smokeless tobacco: Never  Vaping Use   Vaping Use: Never used  Substance and Sexual Activity   Alcohol use: Yes    Alcohol/week: 7.0 standard drinks of alcohol    Types: 7 Glasses of wine per week    Comment: occasionally   Drug use: Not Currently   Sexual activity: Not Currently  Other Topics Concern   Not on file  Social History Narrative   Married.   No children.   Retired. Once worked for [employer].   Enjoys working on his house, playing golf, riding his bike.    Social Determinants of Health   Financial Resource Strain: Low Risk  (06/13/2022)   Overall Financial Resource Strain (CARDIA)    Difficulty of Paying Living Expenses: Not hard at all  Food Insecurity: No Food Insecurity (06/13/2022)   Hunger Vital Sign    Worried About Running Out of Food in the Last Year: Never true    Ran Out of Food in the Last Year: Never true  Transportation Needs: No Transportation Needs (06/13/2022)   PRAPARE - Hydrologist (Medical): No    Lack of Transportation (Non-Medical): No  Physical Activity: Sufficiently Active (06/13/2022)   Exercise Vital Sign    Days  of Exercise per Week: 4 days    Minutes of Exercise per Session: 40 min  Stress: No Stress Concern Present (06/13/2022)   [place]    Feeling of Stress : Not at all  Social Connections: Moderately Integrated (06/13/2022)   Social Connection and Isolation Panel [NHANES]    Frequency of Communication with Friends and Family: More than three times a week    Frequency of Social Gatherings with Friends and Family: More than three times a week    Attends Religious Services: Never    Marine scientist or Organizations: No    Attends Music therapist: More than 4 times per year    Marital Status: Married  Human resources officer Violence: Not At Risk (06/13/2022)   Humiliation, Afraid, Rape, and Kick questionnaire  Fear of Current or Ex-Partner: No    Emotionally Abused: No    Physically Abused: No    Sexually Abused: No    FAMILY HISTORY: Family History  Problem Relation Age of Onset   Emphysema Father    Diabetes Sister     ALLERGIES:  is allergic to lidocaine.  MEDICATIONS:  Current Outpatient Medications  Medication Sig Dispense Refill   amLODipine (NORVASC) 5 MG tablet Take 5 mg by mouth daily.     apixaban (ELIQUIS) 2.5 MG TABS tablet Take 1 tablet (2.5 mg total) by mouth 2 (two) times daily. 180 tablet 1   atorvastatin (LIPITOR) 20 MG tablet TAKE 1 TABLET DAILY FOR    CHOLESTEROL 90 tablet 2   Coenzyme Q10 (COQ10 PO) Take 1 capsule by mouth daily.      fenofibrate micronized (LOFIBRA) 134 MG capsule TAKE 1 CAPSULE DAILY BEFOREBREAKFAST FOR CHOLESTEROL 90 capsule 2   GLUCOSAMINE SULFATE PO Take by mouth.     losartan-hydrochlorothiazide (HYZAAR) 100-12.5 MG tablet Take 1 tablet by mouth daily. for blood pressure. 90 tablet 1   Multiple Vitamin (MULTIVITAMIN) tablet Take 1 tablet by mouth daily.     Omega-3 Fatty Acids (FISH OIL) 1000 MG CAPS Take 1,000 mg by mouth 2 (two) times daily.      sertraline (ZOLOFT) 50 MG tablet Take 1 tablet (50 mg total) by mouth daily. For anxiety. 90 tablet 1   triamcinolone cream (KENALOG) 0.1 % Apply 1 application. topically See admin instructions. Use twice daily for  5 days weekly as needed to affected areas of body for psoriasis. Avoid applying to face, groin, and axilla. Use as directed. 80 g 2   No current facility-administered medications for this visit.    Review of Systems  Constitutional:  Negative for appetite change, chills, fatigue, fever and unexpected weight change.  HENT:   Negative for hearing loss and voice change.   Eyes:  Negative for eye problems and icterus.  Respiratory:  Negative for chest tightness, cough and shortness of breath.   Cardiovascular:  Positive for leg swelling. Negative for chest pain.  Gastrointestinal:  Negative for abdominal distention and abdominal pain.  Endocrine: Negative for hot flashes.  Genitourinary:  Negative for difficulty urinating, dysuria and frequency.   Musculoskeletal:  Negative for arthralgias.  Skin:  Negative for itching and rash.  Neurological:  Negative for light-headedness and numbness.  Hematological:  Negative for adenopathy. Does not bruise/bleed easily.  Psychiatric/Behavioral:  Negative for confusion.    PHYSICAL EXAMINATION: ECOG PERFORMANCE STATUS: 0 - Asymptomatic Vitals:   06/15/22 1503  BP: 134/82  Pulse: (!) 57  Resp: 18  Temp: 98.2 F (36.8 C)   Filed Weights   06/15/22 1503  Weight: 245 lb 4.8 oz (111.3 kg)    Physical Exam Constitutional:      General: He is not in acute distress. HENT:     Head: Normocephalic and atraumatic.  Eyes:     General: No scleral icterus. Cardiovascular:     Rate and Rhythm: Normal rate and regular rhythm.     Heart sounds: Normal heart sounds.  Pulmonary:     Effort: Pulmonary effort is normal. No respiratory distress.     Breath sounds: No wheezing.  Abdominal:     General: Bowel sounds are normal. There is no  distension.     Palpations: Abdomen is soft.  Musculoskeletal:        General: No deformity. Normal range of motion.     Cervical  back: Normal range of motion and neck supple.     Right lower leg: Edema present.  Skin:    General: Skin is warm and dry.     Findings: No erythema or rash.  Neurological:     Mental Status: He is alert and oriented to person, place, and time. Mental status is at baseline.     Cranial Nerves: No cranial nerve deficit.     Coordination: Coordination normal.  Psychiatric:        Mood and Affect: Mood normal.     LABORATORY DATA:  I have reviewed the data as listed    Latest Ref Rng & Units 03/29/2022   10:03 AM 05/31/2021   10:08 AM 05/25/2020    8:03 AM  CBC  WBC 4.0 - 10.5 K/uL 6.9  5.2  5.1   Hemoglobin 13.0 - 17.0 g/dL 15.5  16.1  15.5   Hematocrit 39.0 - 52.0 % 44.3  47.8  45.4   Platelets 150 - 400 K/uL 250  234.0  251.0       Latest Ref Rng & Units 03/29/2022   10:03 AM 03/02/2022    9:32 AM 01/19/2022    8:00 AM  CMP  Glucose 70 - 99 mg/dL 89  102  97   BUN 8 - 23 mg/dL '26  23  30   '$ Creatinine 0.61 - 1.24 mg/dL 1.51  1.40  1.59   Sodium 135 - 145 mmol/L 138  138  141   Potassium 3.5 - 5.1 mmol/L 3.7  3.9  3.7   Chloride 98 - 111 mmol/L 103  102  101   CO2 22 - 32 mmol/L '28  27  29   '$ Calcium 8.9 - 10.3 mg/dL 9.6  9.9  10.1   Total Protein 6.5 - 8.1 g/dL 7.6     Total Bilirubin 0.3 - 1.2 mg/dL 0.8     Alkaline Phos 38 - 126 U/L 32     AST 15 - 41 U/L 37     ALT 0 - 44 U/L 32         RADIOGRAPHIC STUDIES: I have personally reviewed the radiological images as listed and agreed with the findings in the report. US Venous Img Lower Unilateral Right  Result Date: 06/12/2022 CLINICAL DATA:  Follow-up of right posterior tibial vein DVT in July. EXAM: RIGHT LOWER EXTREMITY VENOUS DOPPLER ULTRASOUND TECHNIQUE: Gray-scale sonography with graded compression, as well as color Doppler and duplex ultrasound were performed to evaluate the lower  extremity deep venous systems from the level of the common femoral vein and including the common femoral, femoral, profunda femoral, popliteal and calf veins including the posterior tibial, peroneal and gastrocnemius veins when visible. The superficial great saphenous vein was also interrogated. Spectral Doppler was utilized to evaluate flow at rest and with distal augmentation maneuvers in the common femoral, femoral and popliteal veins. COMPARISON:  03/02/2022 FINDINGS: Contralateral Common Femoral Vein: Respiratory phasicity is normal and symmetric with the symptomatic side. No evidence of thrombus. Normal compressibility. Common Femoral Vein: No evidence of thrombus. Normal compressibility, respiratory phasicity and response to augmentation. Saphenofemoral Junction: No evidence of thrombus. Normal compressibility and flow on color Doppler imaging. Profunda Femoral Vein: No evidence of thrombus. Normal compressibility and flow on color Doppler imaging. Femoral Vein: No evidence of thrombus. Normal compressibility, respiratory phasicity and response to augmentation. Popliteal Vein: No evidence of thrombus. Normal compressibility, respiratory phasicity and response to augmentation. Calf Veins: No evidence of thrombus. No further thrombus  identified in the right posterior tibial vein. No evidence of chronic thrombus by duplex ultrasound. Normal compressibility and flow on color Doppler imaging. Superficial Great Saphenous Vein: No evidence of thrombus. Normal compressibility. Venous Reflux:  None. Other Findings: No evidence of superficial thrombophlebitis or abnormal fluid collection. IMPRESSION: No evidence of right lower extremity deep vein thrombosis. Resolution of right posterior tibial vein DVT. Electronically Signed   By: Aletta Edouard M.D.   On: 06/12/2022 15:29

## 2022-06-15 NOTE — Assessment & Plan Note (Addendum)
S/p 3 months of therapeutic anticoagulation Negative  prothrombin gene mutation, factor V Leiden mutation, Oct 2023  right lower extremity ultrasound negative.  Recommend patient to decrease Eliquis to 2.'5mg'$  BID, long term maintenance.

## 2022-06-26 NOTE — Telephone Encounter (Signed)
Per notes in Care Everywhere - pt was scheduled for appt with Neurosurgery  Telephone Encounter - Peggyann Shoals - 02/02/2022 11:30 AM EDT Formatting of this note might be different from the original. He confirmed for 03/09/2022 is aware of the new location. Electronically signed by Peggyann Shoals at 02/02/2022 11:30 AM EDT   Telephone Encounter - Nancy Fetter, [place] - 02/01/2022 10:57 AM EDT Formatting of this note might be different from the original. We have received a referral on this patient from Alma Friendly, NP for back pain. Please schedule with Danielle.

## 2022-07-03 IMAGING — DX DG LUMBAR SPINE COMPLETE 4+V
5 series · 5 of 5 positions shown · non-contrast
Comparison: None Available.

CLINICAL DATA: Low back pain, right radiculopathy

EXAM:
LUMBAR SPINE - COMPLETE 4+ VIEW

[lumbar spine ap]
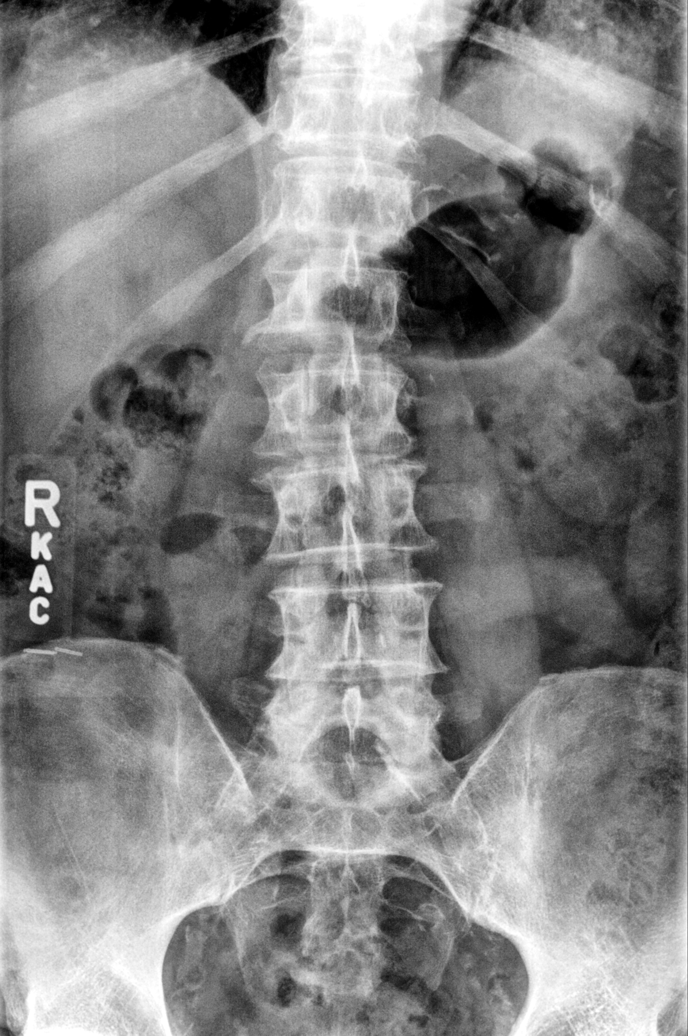

[lumbar spine mlo (1 of 2)]
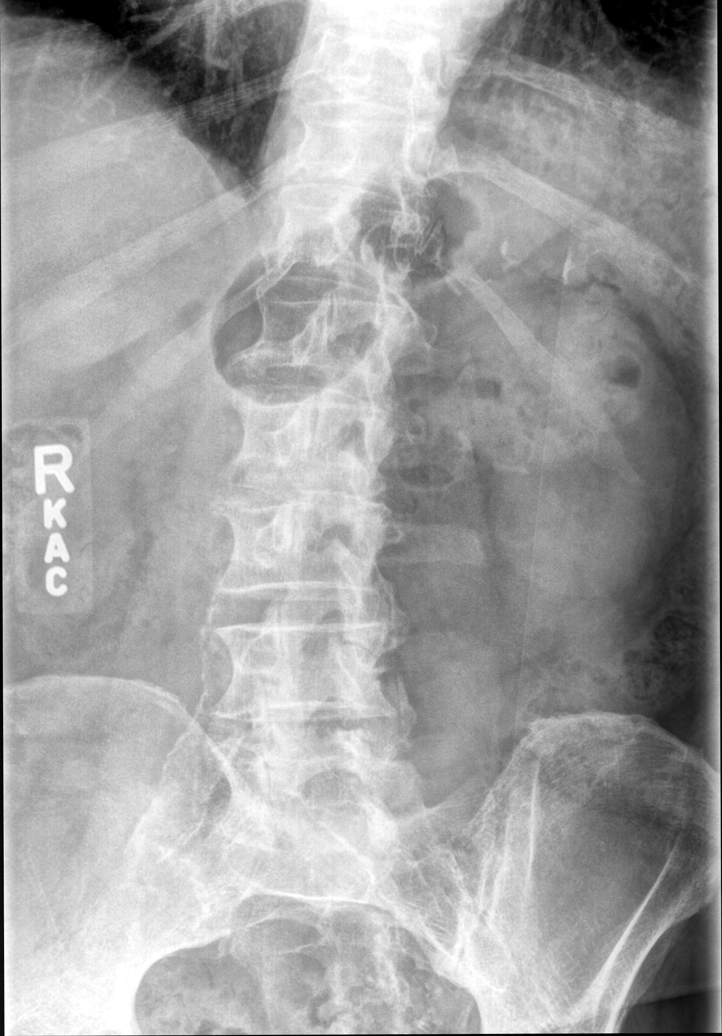

[lumbar spine mlo (2 of 2)]
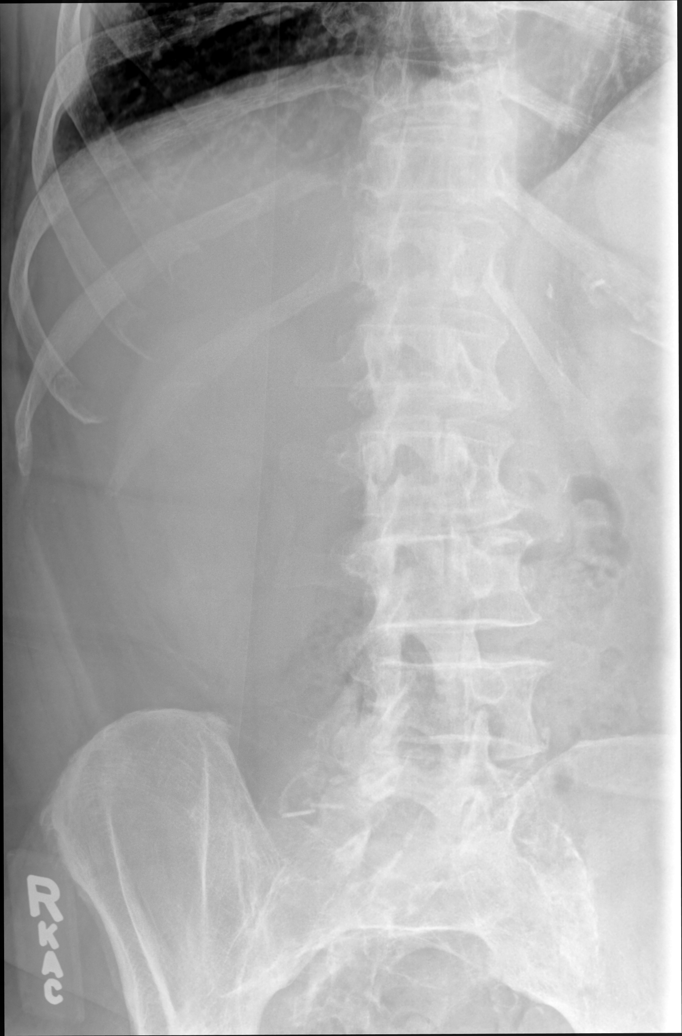

[lumbar spine lat (1 of 2)]
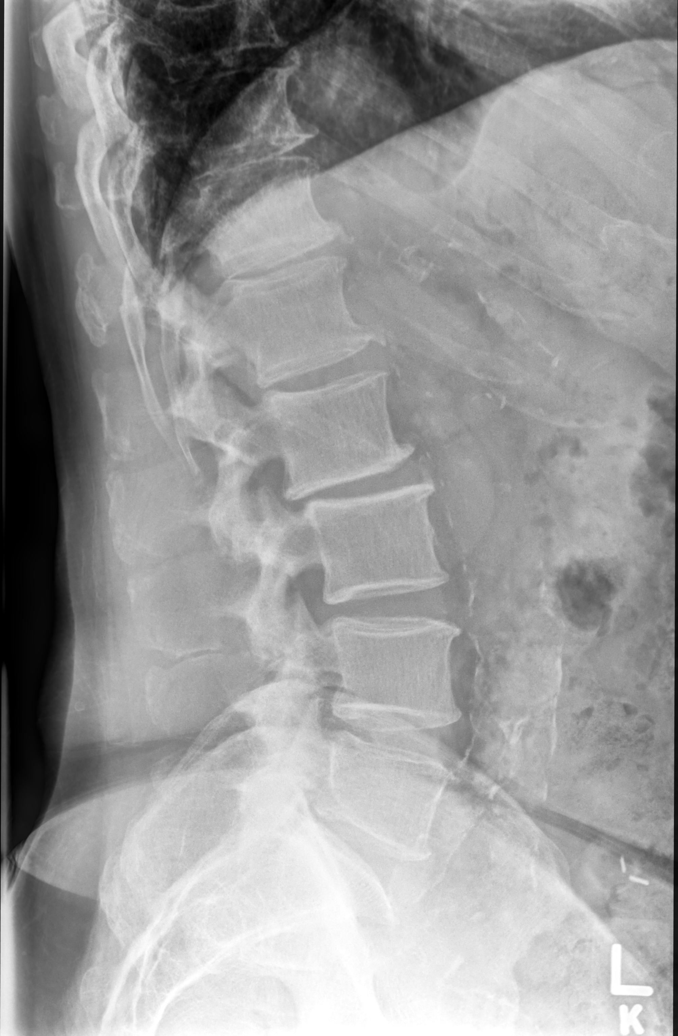

[lumbar spine lat (2 of 2)]
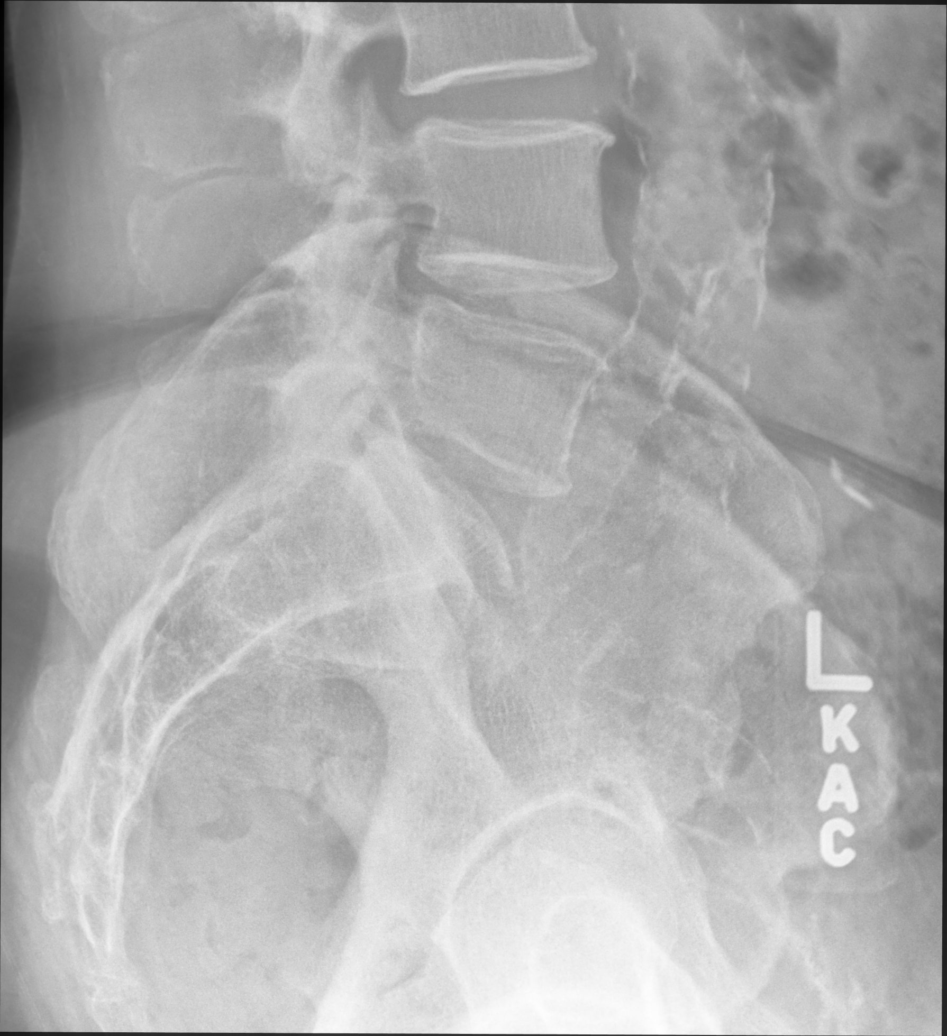

[5 of 5 positions shown; findings below may reference images not displayed]

FINDINGS: No fracture is seen. Degenerative changes are noted with bony spurs
and facet hypertrophy at multiple levels. There is narrowing of disc
space at L2-L3 and L5-S1 levels. There is minimal retrolisthesis at
L2-L3 level. Arterial calcifications are seen.
IMPRESSION: No recent fracture is seen in the lumbar spine. Degenerative changes
are noted at multiple levels as described in the body of the report.
There is minimal retrolisthesis at L2-L3 level.

## 2022-07-04 ENCOUNTER — Encounter: Payer: Self-pay | Admitting: Primary Care

## 2022-07-04 ENCOUNTER — Ambulatory Visit (INDEPENDENT_AMBULATORY_CARE_PROVIDER_SITE_OTHER): Payer: Medicare Other | Admitting: Primary Care

## 2022-07-04 VITALS — BP 160/86 | HR 53 | Temp 97.5°F | Ht 74.0 in | Wt 247.0 lb

## 2022-07-04 DIAGNOSIS — N1831 Chronic kidney disease, stage 3a: Secondary | ICD-10-CM | POA: Diagnosis not present

## 2022-07-04 DIAGNOSIS — R7303 Prediabetes: Secondary | ICD-10-CM

## 2022-07-04 DIAGNOSIS — M5441 Lumbago with sciatica, right side: Secondary | ICD-10-CM | POA: Diagnosis not present

## 2022-07-04 DIAGNOSIS — I10 Essential (primary) hypertension: Secondary | ICD-10-CM

## 2022-07-04 DIAGNOSIS — F411 Generalized anxiety disorder: Secondary | ICD-10-CM

## 2022-07-04 DIAGNOSIS — G8929 Other chronic pain: Secondary | ICD-10-CM

## 2022-07-04 DIAGNOSIS — E785 Hyperlipidemia, unspecified: Secondary | ICD-10-CM | POA: Diagnosis not present

## 2022-07-04 LAB — BASIC METABOLIC PANEL
BUN: 25 mg/dL — ABNORMAL HIGH (ref 6–23)
CO2: 30 mEq/L (ref 19–32)
Calcium: 9.9 mg/dL (ref 8.4–10.5)
Chloride: 102 mEq/L (ref 96–112)
Creatinine, Ser: 1.48 mg/dL (ref 0.40–1.50)
GFR: 44.76 mL/min — ABNORMAL LOW (ref >60.00)
Glucose, Bld: 97 mg/dL (ref 70–99)
Potassium: 3.6 mEq/L (ref 3.5–5.1)
Sodium: 140 mEq/L (ref 135–145)

## 2022-07-04 LAB — LIPID PANEL
Cholesterol: 233 mg/dL — ABNORMAL HIGH (ref 0–200)
HDL: 33.9 mg/dL — ABNORMAL LOW (ref >39.00)
NonHDL: 199.22
Total CHOL/HDL Ratio: 7
Triglycerides: 311 mg/dL — ABNORMAL HIGH (ref 0.0–149.0)
VLDL: 62.2 mg/dL — ABNORMAL HIGH (ref 0.0–40.0)

## 2022-07-04 LAB — LDL CHOLESTEROL, DIRECT: Direct LDL: 158 mg/dL

## 2022-07-04 LAB — HEMOGLOBIN A1C: Hgb A1c MFr Bld: 6 % (ref 4.6–6.5)

## 2022-07-04 MED ORDER — ATORVASTATIN CALCIUM 40 MG PO TABS: 40.0000 mg | ORAL_TABLET | Freq: Every day | ORAL | 3 refills | Status: DC

## 2022-07-04 NOTE — Assessment & Plan Note (Signed)
Continued.  Never followed up with neurosurgery. Overall improved and stable. He will update when ready to see neurosurgery. Declines at this time.

## 2022-07-04 NOTE — Assessment & Plan Note (Signed)
Above goal today, also on recheck.  Home readings are much better. Continue to monitor home readings.  Continue losartan-HCTZ 100-12.5 mg daily, amlodipine 5 mg daily. BMP pending.

## 2022-07-04 NOTE — Assessment & Plan Note (Signed)
Repeat lipid panel pending.  Continue atorvastatin 20 mg daily, fenofibrate 134 mg daily.

## 2022-07-04 NOTE — Patient Instructions (Signed)
Stop by the lab prior to leaving today. I will notify you of your results once received.   It was a pleasure to see you today!  

## 2022-07-04 NOTE — Progress Notes (Signed)
Subjective:    Patient ID: Curtis Rankin., male    DOB: 15-May-1941, 81 y.o.   MRN: [id number]  HPI  Curtis Fridman. is a very pleasant 81 y.o. male with a history of CKD, GAD, prediabetes, DVT, hypertension who presents today for follow up of chronic conditions.  1) Hypertension/Hyperlipidemia: Currently managed on amlodipine 5 mg daily, losartan-hydrochlorothiazide 100-12.5 mg daily, atorvastatin 20 mg daily, fenofibrate 134 mg daily.   He checks his BP at home which runs 130's/70's. He denies chest pain, dizziness, headaches.   2) ELF:YBOFBPZWC managed on sertraline 50 mg daily. Overall doing well on this regimen. He denies SI/HI.   3) History of DVT: Currently managed on Eliquis 2.5 mg BID. Following with hematology, last office visit was in October 2023.  He completed work-up for thrombosis, negative prothrombin gene mutation and factor V Leyden mutation.  He underwent right lower extremity ultrasound in October 2020 which is negative.  The plan is to decrease Eliquis to 2.5 mg twice daily long-term.   BP Readings from Last 3 Encounters:  07/04/22 (!) 160/86  06/15/22 134/82  03/29/22 (!) 153/78    Immunizations: -Tetanus: 2019 -Influenza: Completed this season  -Shingles: Completed Shingrix and Zostavax  -Pneumonia: Prevnar 13 in 2018, Pneumovax in 2020  Diet: Carlin.  Exercise: Playing golf. Active at home.   Colonoscopy: Completed in 2022, no further screening recommended.   PSA: UTD       Review of Systems  Eyes:  Negative for visual disturbance.  Respiratory:  Negative for shortness of breath.   Cardiovascular:  Negative for chest pain.  Neurological:  Negative for dizziness and headaches.         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   CKD (chronic kidney disease) stage 3, GFR 30-59 ml/min (HCC)    Essential hypertension    Gross hematuria 07/29/2021   Hyperlipidemia    Prostate cancer ([place]) 2009   Psoriasis     Social History   Socioeconomic History   Marital status: Married    Spouse name: Not on file   Number of children: Not on file   Years of education: Not on file   Highest education level: Not on file  Occupational History   Not on file  Tobacco Use   Smoking status: Former    Packs/day: 2.00    Years: 15.00    Total pack years: 30.00    Types: Cigarettes    Quit date: 39    Years since quitting: 25.8   Smokeless tobacco: Never  Vaping Use   Vaping Use: Never used  Substance and Sexual Activity   Alcohol use: Yes    Alcohol/week: 7.0 standard drinks of alcohol    Types: 7 Glasses of wine per week    Comment: occasionally   Drug use: Not Currently   Sexual activity: Not Currently  Other Topics Concern   Not on file  Social History Narrative   Married.   No children.   Retired. Once worked for [employer].   Enjoys working on his house, playing golf, riding his bike.  Social Determinants of Health   Financial Resource Strain: Low Risk  (06/13/2022)   Overall Financial Resource Strain (CARDIA)    Difficulty of Paying Living Expenses: Not hard at all  Food Insecurity: No Food Insecurity (06/13/2022)   Hunger Vital Sign    Worried About Running Out of Food in the Last Year: Never true    Ran Out of Food in the Last Year: Never true  Transportation Needs: No Transportation Needs (06/13/2022)   PRAPARE - Hydrologist (Medical): No    Lack of Transportation (Non-Medical): No  Physical Activity: Sufficiently Active (06/13/2022)   Exercise Vital Sign    Days of Exercise per Week: 4 days    Minutes of Exercise per Session: 40 min   Stress: No Stress Concern Present (06/13/2022)   [place]    Feeling of Stress : Not at all  Social Connections: Moderately Integrated (06/13/2022)   Social Connection and Isolation Panel [NHANES]    Frequency of Communication with Friends and Family: More than three times a week    Frequency of Social Gatherings with Friends and Family: More than three times a week    Attends Religious Services: Never    Marine scientist or Organizations: No    Attends Music therapist: More than 4 times per year    Marital Status: Married  Human resources officer Violence: Not At Risk (06/13/2022)   Humiliation, Afraid, Rape, and Kick questionnaire    Fear of Current or Ex-Partner: No    Emotionally Abused: No    Physically Abused: No    Sexually Abused: No    Past Surgical History:  Procedure Laterality Date   APPENDECTOMY     BOWEL RESECTION     CATARACT EXTRACTION W/PHACO Right 01/05/2021   Procedure: CATARACT EXTRACTION PHACO AND INTRAOCULAR LENS PLACEMENT ([place]) RIGHT OMIDRIA;  Surgeon: Leandrew Koyanagi, MD;  Location: [facility];  Service: Ophthalmology;  Laterality: Right;  cde 5.46 01:01.7 minutes 8.9%   CATARACT EXTRACTION W/PHACO Left 01/26/2021   Procedure: CATARACT EXTRACTION PHACO AND INTRAOCULAR LENS PLACEMENT ([place]) LEFT OMIDRIA 5.35 00:50.6;  Surgeon: Leandrew Koyanagi, MD;  Location: [facility];  Service: Ophthalmology;  Laterality: Left;   COLON SURGERY     COLONOSCOPY WITH PROPOFOL N/A 06/05/2017   Procedure: COLONOSCOPY WITH PROPOFOL;  Surgeon: Jonathon Bellows, MD;  Location[facility] ENDOSCOPY;  Service: Gastroenterology;  Laterality: N/A;   COLONOSCOPY WITH PROPOFOL N/A 06/15/2020   Procedure: COLONOSCOPY WITH PROPOFOL;  Surgeon: Jonathon Bellows, MD;  Location: [facility] ENDOSCOPY;  Service: Gastroenterology;  Laterality: N/A;   COLONOSCOPY WITH PROPOFOL N/A 11/16/2020   Procedure: COLONOSCOPY  WITH PROPOFOL;  Surgeon: Jonathon Bellows, MD;  Location[facility] ENDOSCOPY;  Service: Gastroenterology;  Laterality: N/A;   KNEE ARTHROSCOPY Right    PROSTATECTOMY     thyroid biop      Family History  Problem Relation Age of Onset   Emphysema Father    Diabetes Sister     Allergies  Allergen Reactions   Lidocaine     Cut off wind    Current Outpatient Medications on File Prior to Visit  Medication Sig Dispense Refill   amLODipine (NORVASC) 5 MG tablet Take 5 mg by mouth daily.     apixaban (ELIQUIS) 2.5 MG TABS tablet Take 1 tablet (2.5 mg total) by mouth 2 (two) times daily. 180 tablet 1   atorvastatin (LIPITOR) 20 MG tablet TAKE 1 TABLET DAILY FOR  CHOLESTEROL 90 tablet 2   Coenzyme Q10 (COQ10 PO) Take 1 capsule by mouth daily.      fenofibrate micronized (LOFIBRA) 134 MG capsule TAKE 1 CAPSULE DAILY BEFOREBREAKFAST FOR CHOLESTEROL 90 capsule 2   GLUCOSAMINE SULFATE PO Take by mouth.     losartan-hydrochlorothiazide (HYZAAR) 100-12.5 MG tablet Take 1 tablet by mouth daily. for blood pressure. 90 tablet 1   Multiple Vitamin (MULTIVITAMIN) tablet Take 1 tablet by mouth daily.     Omega-3 Fatty Acids (FISH OIL) 1000 MG CAPS Take 1,000 mg by mouth 2 (two) times daily.     sertraline (ZOLOFT) 50 MG tablet Take 1 tablet (50 mg total) by mouth daily. For anxiety. 90 tablet 1   triamcinolone cream (KENALOG) 0.1 % Apply 1 application. topically See admin instructions. Use twice daily for  5 days weekly as needed to affected areas of body for psoriasis. Avoid applying to face, groin, and axilla. Use as directed. 80 g 2   No current facility-administered medications on file prior to visit.    BP (!) 160/86   Pulse (!) 53   Temp (!) 97.5 F (36.4 C) (Temporal)   Ht '6\' 2"'$  (1.88 m)   Wt 247 lb (112 kg)   SpO2 95%   BMI 31.71 kg/m  Objective:   Physical Exam Cardiovascular:     Rate and Rhythm: Normal rate and regular rhythm.  Pulmonary:     Effort: Pulmonary effort is normal.      Breath sounds: Normal breath sounds. No wheezing or rales.  Abdominal:     General: Bowel sounds are normal.     Palpations: Abdomen is soft.     Tenderness: There is no abdominal tenderness.  Musculoskeletal:     Cervical back: Neck supple.  Skin:    General: Skin is warm and dry.  Neurological:     Mental Status: He is alert and oriented to person, place, and time.  Psychiatric:        Mood and Affect: Mood normal.           Assessment & Plan:   Problem List Items Addressed This Visit       Cardiovascular and Mediastinum   Essential hypertension    Above goal today, also on recheck.  Home readings are much better. Continue to monitor home readings.  Continue losartan-HCTZ 100-12.5 mg daily, amlodipine 5 mg daily. BMP pending.      Relevant Orders   Basic metabolic panel     Genitourinary   CKD (chronic kidney disease) stage 3, GFR 30-59 ml/min (HCC) - Primary    Repeat kidney function pending.  Continue to avoid NSAID treatment.         Other   GAD (generalized anxiety disorder)    Controlled.  Continue sertraline 50 mg daily.      Hyperlipidemia    Repeat lipid panel pending.  Continue atorvastatin 20 mg daily, fenofibrate 134 mg daily.      Relevant Orders   Lipid panel   Prediabetes    Repeat A1C pending.   Discussed the importance of a healthy diet and regular exercise in order for weight loss, and to reduce the risk of further co-morbidity.       Relevant Orders   Hemoglobin A1c   Chronic back pain    Continued.  Never followed up with neurosurgery. Overall improved and stable. He will update when ready to see neurosurgery. Declines at this time.  Pleas Koch, NP

## 2022-07-04 NOTE — Assessment & Plan Note (Signed)
Repeat kidney function pending.  Continue to avoid NSAID treatment.

## 2022-07-04 NOTE — Assessment & Plan Note (Signed)
Repeat A1C pending.  Discussed the importance of a healthy diet and regular exercise in order for weight loss, and to reduce the risk of further co-morbidity.  

## 2022-07-04 NOTE — Assessment & Plan Note (Signed)
Controlled.  Continue sertraline 50 mg daily.

## 2022-07-05 ENCOUNTER — Other Ambulatory Visit: Payer: Self-pay | Admitting: Primary Care

## 2022-07-05 DIAGNOSIS — F411 Generalized anxiety disorder: Secondary | ICD-10-CM

## 2022-07-05 DIAGNOSIS — I10 Essential (primary) hypertension: Secondary | ICD-10-CM

## 2022-07-06 DIAGNOSIS — I10 Essential (primary) hypertension: Secondary | ICD-10-CM

## 2022-07-09 MED ORDER — AMLODIPINE BESYLATE 10 MG PO TABS: 10.0000 mg | ORAL_TABLET | Freq: Every day | ORAL | 3 refills | Status: DC

## 2022-09-07 DIAGNOSIS — C44319 Basal cell carcinoma of skin of other parts of face: Secondary | ICD-10-CM | POA: Diagnosis not present

## 2022-09-12 DIAGNOSIS — H43813 Vitreous degeneration, bilateral: Secondary | ICD-10-CM | POA: Diagnosis not present

## 2022-09-12 DIAGNOSIS — Z961 Presence of intraocular lens: Secondary | ICD-10-CM | POA: Diagnosis not present

## 2022-09-19 ENCOUNTER — Ambulatory Visit (INDEPENDENT_AMBULATORY_CARE_PROVIDER_SITE_OTHER): Payer: Medicare Other | Admitting: Dermatology

## 2022-09-19 ENCOUNTER — Encounter: Payer: Self-pay | Admitting: Dermatology

## 2022-09-19 VITALS — BP 152/86 | HR 60

## 2022-09-19 DIAGNOSIS — L820 Inflamed seborrheic keratosis: Secondary | ICD-10-CM

## 2022-09-19 DIAGNOSIS — L570 Actinic keratosis: Secondary | ICD-10-CM

## 2022-09-19 DIAGNOSIS — Z85828 Personal history of other malignant neoplasm of skin: Secondary | ICD-10-CM | POA: Diagnosis not present

## 2022-09-19 DIAGNOSIS — L821 Other seborrheic keratosis: Secondary | ICD-10-CM

## 2022-09-19 DIAGNOSIS — L578 Other skin changes due to chronic exposure to nonionizing radiation: Secondary | ICD-10-CM

## 2022-09-19 NOTE — Progress Notes (Signed)
   Follow-Up Visit   Subjective  Curtis Black is a 82 y.o. male who presents for the following: Actinic Keratosis (Face, 32mf/u). The patient has spots, moles and lesions to be evaluated, some may be new or changing and the patient has concerns that these could be cancer.  The following portions of the chart were reviewed this encounter and updated as appropriate:   Tobacco  Allergies  Meds  Problems  Med Hx  Surg Hx  Fam Hx     Review of Systems:  No other skin or systemic complaints except as noted in HPI or Assessment and Plan.  Objective  Well appearing patient in no apparent distress; mood and affect are within normal limits.  A focused examination was performed including face, scalp, ears, hands. Relevant physical exam findings are noted in the Assessment and Plan.  bil hands x 5, face x 1 (6) Stuck on waxy paps with erythema  face x 4 (4) Pink scaly macules   Assessment & Plan   Actinic Damage - chronic, secondary to cumulative UV radiation exposure/sun exposure over time - diffuse scaly erythematous macules with underlying dyspigmentation - Recommend daily broad spectrum sunscreen SPF 30+ to sun-exposed areas, reapply every 2 hours as needed.  - Recommend staying in the shade or wearing long sleeves, sun glasses (UVA+UVB protection) and wide brim hats (4-inch brim around the entire circumference of the hat). - Call for new or changing lesions.   History of Basal Cell Carcinoma of the Skin - No evidence of recurrence today - Recommend regular full body skin exams - Recommend daily broad spectrum sunscreen SPF 30+ to sun-exposed areas, reapply every 2 hours as needed.  - Call if any new or changing lesions are noted between office visits  - superior to glabella  Seborrheic Keratoses - Stuck-on, waxy, tan-brown papules and/or plaques  - Benign-appearing - Discussed benign etiology and prognosis. - Observe - Call for any changes - hands  Inflamed seborrheic  keratosis (6) bil hands x 5, face x 1 Symptomatic, irritating, patient would like treated. Destruction of lesion - bil hands x 5, face x 1 Complexity: simple   Destruction method: cryotherapy   Informed consent: discussed and consent obtained   Timeout:  patient name, date of birth, surgical site, and procedure verified Lesion destroyed using liquid nitrogen: Yes   Region frozen until ice ball extended beyond lesion: Yes   Outcome: patient tolerated procedure well with no complications   Post-procedure details: wound care instructions given    AK (actinic keratosis) (4) face x 4 Destruction of lesion - face x 4 Complexity: simple   Destruction method: cryotherapy   Informed consent: discussed and consent obtained   Timeout:  patient name, date of birth, surgical site, and procedure verified Lesion destroyed using liquid nitrogen: Yes   Region frozen until ice ball extended beyond lesion: Yes   Outcome: patient tolerated procedure well with no complications   Post-procedure details: wound care instructions given    Return in about 8 months (around 05/21/2023) for TBSE, Hx of BCC, Hx of AKs.  I, SOthelia Pulling RMA, am acting as scribe for DSarina Ser MD . Documentation: I have reviewed the above documentation for accuracy and completeness, and I agree with the above.  DSarina Ser MD

## 2022-09-19 NOTE — Patient Instructions (Addendum)
Cryotherapy Aftercare  Wash gently with soap and water everyday.   Apply Vaseline and Band-Aid daily until healed.     Due to recent changes in healthcare laws, you may see results of your pathology and/or laboratory studies on MyChart before the doctors have had a chance to review them. We understand that in some cases there may be results that are confusing or concerning to you. Please understand that not all results are received at the same time and often the doctors may need to interpret multiple results in order to provide you with the best plan of care or course of treatment. Therefore, we ask that you please give us 2 business days to thoroughly review all your results before contacting the office for clarification. Should we see a critical lab result, you will be contacted sooner.   If You Need Anything After Your Visit  If you have any questions or concerns for your doctor, please call our main line at [phone] and press option 4 to reach your doctor's medical assistant. If no one answers, please leave a voicemail as directed and we will return your call as soon as possible. Messages left after 4 pm will be answered the following business day.   You may also send us a message via MyChart. We typically respond to MyChart messages within 1-2 business days.  For prescription refills, please ask your pharmacy to contact our office. Our fax number is [phone].  If you have an urgent issue when the clinic is closed that cannot wait until the next business day, you can page your doctor at the number below.    Please note that while we do our best to be available for urgent issues outside of office hours, we are not available 24/7.   If you have an urgent issue and are unable to reach us, you may choose to seek medical care at your doctor's office, retail clinic, urgent care center, or emergency room.  If you have a medical emergency, please immediately call 911 or go to the  emergency department.  Pager Numbers  - Dr. Kowalski: [phone]  - Dr. Moye: [phone]  - Dr. Stewart: [phone]  In the event of inclement weather, please call our main line at [phone] for an update on the status of any delays or closures.  Dermatology Medication Tips: Please keep the boxes that topical medications come in in order to help keep track of the instructions about where and how to use these. Pharmacies typically print the medication instructions only on the boxes and not directly on the medication tubes.   If your medication is too expensive, please contact our office at [phone] option 4 or send us a message through MyChart.   We are unable to tell what your co-pay for medications will be in advance as this is different depending on your insurance coverage. However, we may be able to find a substitute medication at lower cost or fill out paperwork to get insurance to cover a needed medication.   If a prior authorization is required to get your medication covered by your insurance company, please allow us 1-2 business days to complete this process.  Drug prices often vary depending on where the prescription is filled and some pharmacies may offer cheaper prices.  The website www.goodrx.com contains coupons for medications through different pharmacies. The prices here do not account for what the cost may be with help from insurance (it may be cheaper with your insurance), but the website can   give you the price if you did not use any insurance.  - You can print the associated coupon and take it with your prescription to the pharmacy.  - You may also stop by our office during regular business hours and pick up a GoodRx coupon card.  - If you need your prescription sent electronically to a different pharmacy, notify our office through [facility] MyChart or by phone at [phone] option 4.     Si Usted Necesita Algo Despus de Su Visita  Tambin puede  enviarnos un mensaje a travs de MyChart. Por lo general respondemos a los mensajes de MyChart en el transcurso de 1 a 2 das hbiles.  Para renovar recetas, por favor pida a su farmacia que se ponga en contacto con nuestra oficina. Nuestro nmero de fax es el [phone].  Si tiene un asunto urgente cuando la clnica est cerrada y que no puede esperar hasta el siguiente da hbil, puede llamar/localizar a su doctor(a) al nmero que aparece a continuacin.   Por favor, tenga en cuenta que aunque hacemos todo lo posible para estar disponibles para asuntos urgentes fuera del horario de oficina, no estamos disponibles las 24 horas del da, los 7 das de la semana.   Si tiene un problema urgente y no puede comunicarse con nosotros, puede optar por buscar atencin mdica  en el consultorio de su doctor(a), en una clnica privada, en un centro de atencin urgente o en una sala de emergencias.  Si tiene una emergencia mdica, por favor llame inmediatamente al 911 o vaya a la sala de emergencias.  Nmeros de bper  - Dr. Kowalski: [phone]  - Dra. Moye: [phone]  - Dra. Stewart: [phone]  En caso de inclemencias del tiempo, por favor llame a nuestra lnea principal al [phone] para una actualizacin sobre el estado de cualquier retraso o cierre.  Consejos para la medicacin en dermatologa: Por favor, guarde las cajas en las que vienen los medicamentos de uso tpico para ayudarle a seguir las instrucciones sobre dnde y cmo usarlos. Las farmacias generalmente imprimen las instrucciones del medicamento slo en las cajas y no directamente en los tubos del medicamento.   Si su medicamento es muy caro, por favor, pngase en contacto con nuestra oficina llamando al [phone] y presione la opcin 4 o envenos un mensaje a travs de MyChart.   No podemos decirle cul ser su copago por los medicamentos por adelantado ya que esto es diferente dependiendo de la cobertura de su seguro.  Sin embargo, es posible que podamos encontrar un medicamento sustituto a menor costo o llenar un formulario para que el seguro cubra el medicamento que se considera necesario.   Si se requiere una autorizacin previa para que su compaa de seguros cubra su medicamento, por favor permtanos de 1 a 2 das hbiles para completar este proceso.  Los precios de los medicamentos varan con frecuencia dependiendo del lugar de dnde se surte la receta y alguna farmacias pueden ofrecer precios ms baratos.  El sitio web www.goodrx.com tiene cupones para medicamentos de diferentes farmacias. Los precios aqu no tienen en cuenta lo que podra costar con la ayuda del seguro (puede ser ms barato con su seguro), pero el sitio web puede darle el precio si no utiliz ningn seguro.  - Puede imprimir el cupn correspondiente y llevarlo con su receta a la farmacia.  - Tambin puede pasar por nuestra oficina durante el horario de atencin regular y recoger una tarjeta de cupones de GoodRx.  -   Si necesita que su receta se enve electrnicamente a una farmacia diferente, informe a nuestra oficina a travs de MyChart de [place] o por telfono llamando al [phone] y presione la opcin 4.  

## 2022-09-27 ENCOUNTER — Encounter: Payer: Self-pay | Admitting: Dermatology

## 2022-10-03 ENCOUNTER — Other Ambulatory Visit (INDEPENDENT_AMBULATORY_CARE_PROVIDER_SITE_OTHER): Payer: Medicare Other

## 2022-10-03 DIAGNOSIS — E785 Hyperlipidemia, unspecified: Secondary | ICD-10-CM | POA: Diagnosis not present

## 2022-10-03 LAB — LIPID PANEL
Cholesterol: 164 mg/dL (ref 0–200)
HDL: 34.3 mg/dL — ABNORMAL LOW (ref >39.00)
NonHDL: 129.36
Total CHOL/HDL Ratio: 5
Triglycerides: 252 mg/dL — ABNORMAL HIGH (ref 0.0–149.0)
VLDL: 50.4 mg/dL — ABNORMAL HIGH (ref 0.0–40.0)

## 2022-10-03 LAB — LDL CHOLESTEROL, DIRECT: Direct LDL: 90 mg/dL

## 2022-12-07 ENCOUNTER — Other Ambulatory Visit: Payer: Self-pay | Admitting: Primary Care

## 2022-12-07 DIAGNOSIS — I10 Essential (primary) hypertension: Secondary | ICD-10-CM

## 2022-12-15 ENCOUNTER — Encounter: Payer: Self-pay | Admitting: Oncology

## 2022-12-15 ENCOUNTER — Inpatient Hospital Stay (HOSPITAL_BASED_OUTPATIENT_CLINIC_OR_DEPARTMENT_OTHER): Payer: Medicare Other | Admitting: Oncology

## 2022-12-15 ENCOUNTER — Inpatient Hospital Stay: Payer: Medicare Other | Attending: Oncology

## 2022-12-15 VITALS — BP 144/71 | HR 52 | Temp 97.4°F | Resp 18 | Wt 245.9 lb

## 2022-12-15 DIAGNOSIS — Z79899 Other long term (current) drug therapy: Secondary | ICD-10-CM | POA: Insufficient documentation

## 2022-12-15 DIAGNOSIS — Z87891 Personal history of nicotine dependence: Secondary | ICD-10-CM | POA: Diagnosis not present

## 2022-12-15 DIAGNOSIS — Z86718 Personal history of other venous thrombosis and embolism: Secondary | ICD-10-CM | POA: Diagnosis not present

## 2022-12-15 DIAGNOSIS — M7989 Other specified soft tissue disorders: Secondary | ICD-10-CM | POA: Insufficient documentation

## 2022-12-15 DIAGNOSIS — I87001 Postthrombotic syndrome without complications of right lower extremity: Secondary | ICD-10-CM

## 2022-12-15 DIAGNOSIS — Z8546 Personal history of malignant neoplasm of prostate: Secondary | ICD-10-CM | POA: Diagnosis not present

## 2022-12-15 DIAGNOSIS — Z9079 Acquired absence of other genital organ(s): Secondary | ICD-10-CM | POA: Diagnosis not present

## 2022-12-15 DIAGNOSIS — I82441 Acute embolism and thrombosis of right tibial vein: Secondary | ICD-10-CM

## 2022-12-15 DIAGNOSIS — Z7901 Long term (current) use of anticoagulants: Secondary | ICD-10-CM | POA: Diagnosis not present

## 2022-12-15 LAB — COMPREHENSIVE METABOLIC PANEL
ALT: 31 U/L (ref 0–44)
AST: 33 U/L (ref 15–41)
Albumin: 4.3 g/dL (ref 3.5–5.0)
Alkaline Phosphatase: 51 U/L (ref 38–126)
Anion gap: 9 (ref 5–15)
BUN: 30 mg/dL — ABNORMAL HIGH (ref 8–23)
CO2: 25 mmol/L (ref 22–32)
Calcium: 9 mg/dL (ref 8.9–10.3)
Chloride: 104 mmol/L (ref 98–111)
Creatinine, Ser: 1.39 mg/dL — ABNORMAL HIGH (ref 0.61–1.24)
GFR, Estimated: 52 mL/min — ABNORMAL LOW (ref >60)
Glucose, Bld: 112 mg/dL — ABNORMAL HIGH (ref 70–99)
Potassium: 3.4 mmol/L — ABNORMAL LOW (ref 3.5–5.1)
Sodium: 138 mmol/L (ref 135–145)
Total Bilirubin: 1.2 mg/dL (ref 0.3–1.2)
Total Protein: 7.6 g/dL (ref 6.5–8.1)

## 2022-12-15 LAB — CBC WITH DIFFERENTIAL/PLATELET
Abs Immature Granulocytes: 0.02 10*3/uL (ref 0.00–0.07)
Basophils Absolute: 0.1 10*3/uL (ref 0.0–0.1)
Basophils Relative: 1 %
Eosinophils Absolute: 0.1 10*3/uL (ref 0.0–0.5)
Eosinophils Relative: 2 %
HCT: 40 % (ref 39.0–52.0)
Hemoglobin: 14.2 g/dL (ref 13.0–17.0)
Immature Granulocytes: 0 %
Lymphocytes Relative: 32 %
Lymphs Abs: 2.2 10*3/uL (ref 0.7–4.0)
MCH: 31.2 pg (ref 26.0–34.0)
MCHC: 35.5 g/dL (ref 30.0–36.0)
MCV: 87.9 fL (ref 80.0–100.0)
Monocytes Absolute: 0.6 10*3/uL (ref 0.1–1.0)
Monocytes Relative: 9 %
Neutro Abs: 3.8 10*3/uL (ref 1.7–7.7)
Neutrophils Relative %: 56 %
Platelets: 216 10*3/uL (ref 150–400)
RBC: 4.55 MIL/uL (ref 4.22–5.81)
RDW: 13.1 % (ref 11.5–15.5)
WBC: 6.8 10*3/uL (ref 4.0–10.5)
nRBC: 0 % (ref 0.0–0.2)

## 2022-12-15 MED ORDER — APIXABAN 2.5 MG PO TABS: 2.5000 mg | ORAL_TABLET | Freq: Two times a day (BID) | ORAL | 1 refills | Status: DC

## 2022-12-15 NOTE — Assessment & Plan Note (Signed)
Recommend leg elevation and compression stocking. 

## 2022-12-15 NOTE — Progress Notes (Signed)
Hematology/Oncology Progress note Telephone:([phone] Fax:(336) [phone]     CHIEF COMPLAINTS/REASON FOR VISIT:  right lower extremity DVT   ASSESSMENT & PLAN:   History of DVT (deep vein thrombosis) Previous work up showed negative  prothrombin gene mutation, factor V Leiden mutation, Oct 2023  right lower extremity ultrasound negative.  Continue  Eliquis to 2.5mg  BID, long term maintenance.   Post-thrombotic syndrome of right lower extremity Recommend leg elevation and compression stocking.   Orders Placed This Encounter  Procedures   CBC (Cancer Center Only)    Standing Status:   Future    Standing Expiration Date:   12/15/2023   CMP (Cancer Center only)    Standing Status:   Future    Standing Expiration Date:   12/15/2023   Follow up in 6 months.  All questions were answered. The patient knows to call the clinic with any problems, questions or concerns.  Curtis Patience, MD, PhD [facility] Hematology Oncology 12/15/2022      HISTORY OF PRESENTING ILLNESS:   Curtis Black. is a  82 y.o.  male with PMH listed below was seen in consultation at the request of  Doreene Nest, NP  for evaluation of right lower extremity DVT Patient has chronic back pain with intermittent right swelling pain.  He follows up with neurosurgeon.   03/02/2022, patient was seen by primary care provider.  He has noticed his chronic right lower extremity swelling worse.  Ultrasound was ordered for further evaluation.  Same day lower extremity ultrasound showed DVT of the posterior tibial vein,  Patient has history of prostate cancer, diagnosed in 2009 recent PSA is undetectable. Patient has been started on Eliquis and currently on Eliquis 5 mg twice daily.  He tolerates well.  Denies any bleeding events.  Right lower extremity swelling is slightly better however not completely resolved.  Patient denies any family history of VTE or personal history  of previous VTE.  Denies any  immobilization factors which may contribute to his DVT   INTERVAL HISTORY Curtis Black. is a 82 y.o. male who has above history reviewed by me today presents for follow up visit for right lower extremity DVT Patient take Eliquis 2.5mg  BID. Right lower extremity swelling is better.  No bleeding events.   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   CKD (chronic kidney disease) stage 3, GFR 30-59 ml/min    Essential hypertension    Gross hematuria 07/29/2021   Hyperlipidemia    Prostate cancer 2009   Psoriasis     SURGICAL HISTORY: Past Surgical History:  Procedure Laterality Date   APPENDECTOMY     BOWEL RESECTION     CATARACT EXTRACTION W/PHACO Right 01/05/2021   Procedure: CATARACT EXTRACTION PHACO AND INTRAOCULAR LENS PLACEMENT (IOC) RIGHT OMIDRIA;  Surgeon: Lockie Mola, MD;  Location: [facility];  Service: Ophthalmology;  Laterality: Right;  cde 5.46 01:01.7 minutes 8.9%   CATARACT EXTRACTION W/PHACO Left 01/26/2021   Procedure: CATARACT EXTRACTION PHACO AND INTRAOCULAR LENS PLACEMENT (IOC) LEFT OMIDRIA 5.35 00:50.6;  Surgeon: Lockie Mola, MD;  Location: [facility];  Service: Ophthalmology;  Laterality: Left;   COLON SURGERY     COLONOSCOPY WITH PROPOFOL N/A 06/05/2017   Procedure: COLONOSCOPY WITH PROPOFOL;  Surgeon: Wyline Mood, MD;  Location: [facility];  Service: Gastroenterology;  Laterality: N/A;   COLONOSCOPY WITH PROPOFOL N/A 06/15/2020   Procedure: COLONOSCOPY WITH PROPOFOL;  Surgeon: Wyline Mood, MD;  Location: [facility] ENDOSCOPY;  Service: Gastroenterology;  Laterality: N/A;   COLONOSCOPY  WITH PROPOFOL N/A 11/16/2020   Procedure: COLONOSCOPY WITH PROPOFOL;  Surgeon: Wyline Mood, MD;  Location: [facility] ENDOSCOPY;  Service: Gastroenterology;  Laterality: N/A;   KNEE ARTHROSCOPY Right    PROSTATECTOMY     thyroid biop      SOCIAL HISTORY: Social History   Socioeconomic History   Marital status: Married    Spouse name: Not on file   Number of children: Not on file   Years of education: Not on file   Highest education level: Not on file  Occupational History   Not on file  Tobacco Use   Smoking status: Former    Packs/day: 2.00    Years: 15.00    Additional pack years: 0.00    Total pack years: 30.00    Types: Cigarettes    Quit date: 48    Years since quitting: 26.3   Smokeless tobacco: Never  Vaping Use   Vaping Use: Never used  Substance and Sexual Activity   Alcohol use: Yes    Alcohol/week: 7.0 standard drinks of alcohol    Types: 7 Glasses of wine per week    Comment: occasionally   Drug use: Not Currently   Sexual activity: Not Currently  Other Topics Concern   Not on file  Social History Narrative   Married.   No children.   Retired. Once worked for [employer].   Enjoys working on his house, playing golf, riding his bike.    Social Determinants of Health   Financial Resource Strain: Low Risk  (06/13/2022)   Overall Financial Resource Strain (CARDIA)    Difficulty of Paying Living Expenses: Not hard at all  Food Insecurity: No Food Insecurity (06/13/2022)   Hunger Vital Sign    Worried About Running Out of Food in the Last Year: Never true    Ran Out of Food in the Last Year: Never true  Transportation Needs: No Transportation Needs (06/13/2022)   PRAPARE - Administrator, Civil Service (Medical): No    Lack of Transportation (Non-Medical): No  Physical Activity: Sufficiently Active (06/13/2022)   Exercise Vital Sign    Days of Exercise per Week: 4 days    Minutes of Exercise per Session: 40 min  Stress: No Stress Concern Present  (06/13/2022)   [employer] of Occupational Health - Occupational Stress Questionnaire    Feeling of Stress : Not at all  Social Connections: Moderately Integrated (06/13/2022)   Social Connection and Isolation Panel [NHANES]    Frequency of Communication with Friends and Family: More than three times a week    Frequency of Social Gatherings with Friends and Family: More than three times a week    Attends Religious Services: Never    Database administrator or Organizations: No    Attends Engineer, structural: More than 4 times per year    Marital Status: Married  Catering manager Violence: Not At Risk (06/13/2022)   Humiliation, Afraid, Rape, and Kick questionnaire    Fear of Current or Ex-Partner: No    Emotionally Abused: No    Physically  Abused: No    Sexually Abused: No    FAMILY HISTORY: Family History  Problem Relation Age of Onset   Emphysema Father    Diabetes Sister     ALLERGIES:  is allergic to lidocaine.  MEDICATIONS:  Current Outpatient Medications  Medication Sig Dispense Refill   amLODipine (NORVASC) 10 MG tablet Take 1 tablet (10 mg total) by mouth daily. for blood pressure. 90 tablet 3   atorvastatin (LIPITOR) 40 MG tablet Take 1 tablet (40 mg total) by mouth daily. for cholesterol. 90 tablet 3   Coenzyme Q10 (COQ10 PO) Take 1 capsule by mouth daily.      GLUCOSAMINE SULFATE PO Take by mouth.     losartan-hydrochlorothiazide (HYZAAR) 100-12.5 MG tablet TAKE 1 TABLET DAILY FOR    BLOOD PRESSURE 90 tablet 1   Multiple Vitamin (MULTIVITAMIN) tablet Take 1 tablet by mouth daily.     Omega-3 Fatty Acids (FISH OIL) 1000 MG CAPS Take 1,000 mg by mouth 2 (two) times daily.     sertraline (ZOLOFT) 50 MG tablet TAKE 1 TABLET (50 MG TOTAL) BY MOUTH DAILY. FOR ANXIETY. 90 tablet 3   triamcinolone cream (KENALOG) 0.1 % Apply 1 application. topically See admin instructions. Use twice daily for  5 days weekly as needed to affected areas of body for psoriasis.  Avoid applying to face, groin, and axilla. Use as directed. 80 g 2   apixaban (ELIQUIS) 2.5 MG TABS tablet Take 1 tablet (2.5 mg total) by mouth 2 (two) times daily. 180 tablet 1   fenofibrate micronized (LOFIBRA) 134 MG capsule TAKE 1 CAPSULE DAILY BEFOREBREAKFAST FOR CHOLESTEROL 90 capsule 2   No current facility-administered medications for this visit.    Review of Systems  Constitutional:  Negative for appetite change, chills, fatigue, fever and unexpected weight change.  HENT:   Negative for hearing loss and voice change.   Eyes:  Negative for eye problems and icterus.  Respiratory:  Negative for chest tightness, cough and shortness of breath.   Cardiovascular:  Positive for leg swelling. Negative for chest pain.  Gastrointestinal:  Negative for abdominal distention and abdominal pain.  Endocrine: Negative for hot flashes.  Genitourinary:  Negative for difficulty urinating, dysuria and frequency.   Musculoskeletal:  Negative for arthralgias.  Skin:  Negative for itching and rash.  Neurological:  Negative for light-headedness and numbness.  Hematological:  Negative for adenopathy. Does not bruise/bleed easily.  Psychiatric/Behavioral:  Negative for confusion.    PHYSICAL EXAMINATION: ECOG PERFORMANCE STATUS: 0 - Asymptomatic Vitals:   12/15/22 0931  BP: (!) 144/71  Pulse: (!) 52  Resp: 18  Temp: (!) 97.4 F (36.3 C)  SpO2: 98%   Filed Weights   12/15/22 0931  Weight: 245 lb 14.4 oz (111.5 kg)    Physical Exam Constitutional:      General: He is not in acute distress. HENT:     Head: Normocephalic and atraumatic.  Eyes:     General: No scleral icterus. Cardiovascular:     Rate and Rhythm: Normal rate and regular rhythm.     Heart sounds: Normal heart sounds.  Pulmonary:     Effort: Pulmonary effort is normal. No respiratory distress.     Breath sounds: No wheezing.  Abdominal:     General: Bowel sounds are normal. There is no distension.     Palpations: Abdomen  is soft.  Musculoskeletal:        General: No deformity. Normal range of motion.     Cervical back:  Normal range of motion and neck supple.     Right lower leg: Edema present.  Skin:    General: Skin is warm and dry.     Findings: No erythema or rash.  Neurological:     Mental Status: He is alert and oriented to person, place, and time. Mental status is at baseline.     Cranial Nerves: No cranial nerve deficit.     Coordination: Coordination normal.  Psychiatric:        Mood and Affect: Mood normal.     LABORATORY DATA:  I have reviewed the data as listed    Latest Ref Rng & Units 12/15/2022    9:21 AM 03/29/2022   10:03 AM 05/31/2021   10:08 AM  CBC  WBC 4.0 - 10.5 K/uL 6.8  6.9  5.2   Hemoglobin 13.0 - 17.0 g/dL 82.9  56.2  13.0   Hematocrit 39.0 - 52.0 % 40.0  44.3  47.8   Platelets 150 - 400 K/uL 216  250  234.0       Latest Ref Rng & Units 12/15/2022    9:21 AM 07/04/2022    8:42 AM 03/29/2022   10:03 AM  CMP  Glucose 70 - 99 mg/dL 865  97  89   BUN 8 - 23 mg/dL Creatinine 0.61 - 1.24 mg/dL 7.84  6.96  2.95   Sodium 135 - 145 mmol/L 138  140  138   Potassium 3.5 - 5.1 mmol/L 3.4  3.6  3.7   Chloride 98 - 111 mmol/L 104  102  103   CO2 22 - 32 mmol/L Calcium 8.9 - 10.3 mg/dL 9.0  9.9  9.6   Total Protein 6.5 - 8.1 g/dL 7.6   7.6   Total Bilirubin 0.3 - 1.2 mg/dL 1.2   0.8   Alkaline Phos 38 - 126 U/L 51   32   AST 15 - 41 U/L 33   37   ALT 0 - 44 U/L 31   32       RADIOGRAPHIC STUDIES: I have personally reviewed the radiological images as listed and agreed with the findings in the report. No results found.

## 2022-12-15 NOTE — Assessment & Plan Note (Addendum)
Previous work up showed negative  prothrombin gene mutation, factor V Leiden mutation, Oct 2023  right lower extremity ultrasound negative.  Continue  Eliquis to 2.5mg  BID, long term maintenance.

## 2022-12-21 ENCOUNTER — Ambulatory Visit: Payer: Medicare Other | Admitting: Dermatology

## 2023-02-19 ENCOUNTER — Ambulatory Visit (INDEPENDENT_AMBULATORY_CARE_PROVIDER_SITE_OTHER): Payer: Medicare Other | Admitting: Primary Care

## 2023-02-19 ENCOUNTER — Encounter: Payer: Self-pay | Admitting: Primary Care

## 2023-02-19 ENCOUNTER — Ambulatory Visit
Admission: RE | Admit: 2023-02-19 | Discharge: 2023-02-19 | Disposition: A | Discharge status: Home / Self Care | Payer: Medicare Other | Source: Ambulatory Visit | Attending: Primary Care | Admitting: Primary Care

## 2023-02-19 VITALS — BP 178/80 | HR 93 | Temp 97.3°F | Ht 74.0 in | Wt 250.0 lb

## 2023-02-19 DIAGNOSIS — N281 Cyst of kidney, acquired: Secondary | ICD-10-CM | POA: Diagnosis not present

## 2023-02-19 DIAGNOSIS — R310 Gross hematuria: Secondary | ICD-10-CM | POA: Diagnosis not present

## 2023-02-19 DIAGNOSIS — N210 Calculus in bladder: Secondary | ICD-10-CM | POA: Diagnosis not present

## 2023-02-19 DIAGNOSIS — R10A0 Flank pain, unspecified side: Secondary | ICD-10-CM

## 2023-02-19 DIAGNOSIS — K5730 Diverticulosis of large intestine without perforation or abscess without bleeding: Secondary | ICD-10-CM | POA: Diagnosis not present

## 2023-02-19 DIAGNOSIS — R109 Unspecified abdominal pain: Secondary | ICD-10-CM

## 2023-02-19 DIAGNOSIS — N132 Hydronephrosis with renal and ureteral calculous obstruction: Secondary | ICD-10-CM | POA: Diagnosis not present

## 2023-02-19 LAB — POC URINALSYSI DIPSTICK (AUTOMATED)
Bilirubin, UA: NEGATIVE
Blood, UA: POSITIVE
Glucose, UA: NEGATIVE
Ketones, UA: NEGATIVE
Leukocytes, UA: NEGATIVE
Nitrite, UA: NEGATIVE
Protein, UA: POSITIVE — AB
Spec Grav, UA: 1.015 (ref 1.010–1.025)
Urobilinogen, UA: 0.2 E.U./dL
pH, UA: 5.5 (ref 5.0–8.0)

## 2023-02-19 MED ORDER — TAMSULOSIN HCL 0.4 MG PO CAPS
0.4000 mg | ORAL_CAPSULE | Freq: Every day | ORAL | 0 refills | Status: DC
Start: 2023-02-19 — End: 2023-07-31

## 2023-02-19 NOTE — Addendum Note (Signed)
Addended by: Lonia Blood on: 02/19/2023 11:52 AM   Modules accepted: Orders

## 2023-02-19 NOTE — Assessment & Plan Note (Signed)
Symptoms suspicious for renal stone.  Stat CT renal stone protocol ordered and pending. Urinalysis with 3+ blood, otherwise negative.  Culture ordered and pending.  Start tamsulosin 0.4 mg daily.  Continue to increase water intake.  Continue tramadol 50 mg as needed. Strict ED precautions provided.

## 2023-02-19 NOTE — Assessment & Plan Note (Signed)
Symptoms suspicious for renal stone.  Stat CT renal stone protocol ordered and pending. Urinalysis with 3+ blood, otherwise negative.  Culture ordered and pending.  Start tamsulosin 0.4 mg daily.  Continue to increase water intake.  Continue tramadol 50 mg as needed. Strict ED precautions provided. 

## 2023-02-19 NOTE — Patient Instructions (Signed)
Start tamsulosin (Flomax) 0.4 mg once daily for kidney stone.  We will be in touch today regarding your CT scan.  Continue to push fluids.   It was a pleasure to see you today!

## 2023-02-19 NOTE — Progress Notes (Signed)
Subjective:    Patient ID: Curtis Leyden., male    DOB: [birth year], 82 y.o.   MRN: [id number]  Hematuria Associated symptoms include flank pain. Pertinent negatives include no abdominal pain, dysuria or fever.    Curtis Leyden. is a very pleasant 82 y.o. 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  Symptom onset 02/14/2023 with intermittent gross bright red hematuria.  He is also noticed right lower back pain with radiation to his groin, nausea without vomiting. He denies difficulty urinating, burning with urination.   He's used an ice pack, taken Tylenol, and Tramadol with temporary improvement. He is drinking about 8 glasses of water daily.   Review of Systems  Constitutional:  Negative for fever.  Gastrointestinal:  Negative for abdominal pain.  Genitourinary:  Positive for flank pain and hematuria. Negative for difficulty urinating and dysuria.  Musculoskeletal:  Positive for back pain.         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   CKD (chronic kidney disease) stage 3, GFR 30-59 ml/min (HCC)    Essential hypertension    Gross hematuria 07/29/2021   Hyperlipidemia    Prostate cancer (HCC) 2009   Psoriasis     Social History   Socioeconomic History   Marital status: Married    Spouse name: Not on file   Number of children: Not on file   Years of education: Not on file   Highest education level: Doctorate  Occupational History   Not on file  Tobacco Use   Smoking status: Former    Packs/day:  2.00    Years: 15.00    Additional pack years: 0.00    Total pack years: 30.00    Types: Cigarettes    Quit date: 90    Years since quitting: 26.4   Smokeless tobacco: Never  Vaping Use   Vaping Use: Never used  Substance and Sexual Activity   Alcohol use: Yes    Alcohol/week: 7.0 standard drinks of alcohol    Types: 7 Glasses of wine per week    Comment: occasionally   Drug use: Not Currently   Sexual activity: Not Currently  Other Topics Concern   Not on file  Social History Narrative   Married.   No children.   Retired. Once worked for [employer].   Enjoys working on his house, playing golf, riding his bike.    Social Determinants of Health   Financial Resource Strain: Low Risk  (02/17/2023)   Overall Financial Resource Strain (CARDIA)    Difficulty of Paying Living Expenses: Not hard at all  Food Insecurity: No Food Insecurity (02/17/2023)   Hunger Vital Sign    Worried About Running Out of Food in the Last Year: Never true    Ran Out of Food in the Last Year: Never true  Transportation Needs: No Transportation Needs (02/17/2023)   PRAPARE - Administrator, Civil Service (Medical): No    Lack of Transportation (Non-Medical): No  Physical Activity: Sufficiently Active (  02/17/2023)   Exercise Vital Sign    Days of Exercise per Week: 3 days    Minutes of Exercise per Session: 150+ min  Stress: No Stress Concern Present (02/17/2023)   [employer] of Occupational Health - Occupational Stress Questionnaire    Feeling of Stress : Not at all  Social Connections: Moderately Integrated (02/17/2023)   Social Connection and Isolation Panel [NHANES]    Frequency of Communication with Friends and Family: More than three times a week    Frequency of Social Gatherings with Friends and Family: Once a week    Attends Religious Services: Never    Database administrator or Organizations: Yes    Attends Engineer, structural: More than 4 times per year    Marital  Status: Married  Catering manager Violence: Not At Risk (06/13/2022)   Humiliation, Afraid, Rape, and Kick questionnaire    Fear of Current or Ex-Partner: No    Emotionally Abused: No    Physically Abused: No    Sexually Abused: No    Past Surgical History:  Procedure Laterality Date   APPENDECTOMY     BOWEL RESECTION     CATARACT EXTRACTION W/PHACO Right 01/05/2021   Procedure: CATARACT EXTRACTION PHACO AND INTRAOCULAR LENS PLACEMENT (IOC) RIGHT OMIDRIA;  Surgeon: Lockie Mola, MD;  Location: [facility];  Service: Ophthalmology;  Laterality: Right;  cde 5.46 01:01.7 minutes 8.9%   CATARACT EXTRACTION W/PHACO Left 01/26/2021   Procedure: CATARACT EXTRACTION PHACO AND INTRAOCULAR LENS PLACEMENT (IOC) LEFT OMIDRIA 5.35 00:50.6;  Surgeon: Lockie Mola, MD;  Location: [facility];  Service: Ophthalmology;  Laterality: Left;   COLON SURGERY     COLONOSCOPY WITH PROPOFOL N/A 06/05/2017   Procedure: COLONOSCOPY WITH PROPOFOL;  Surgeon: Wyline Mood, MD;  Location: [facility]COPY;  Service: Gastroenterology;  Laterality: N/A;   COLONOSCOPY WITH PROPOFOL N/A 06/15/2020   Procedure: COLONOSCOPY WITH PROPOFOL;  Surgeon: Wyline Mood, MD;  Location: [facility] ENDOSCOPY;  Service: Gastroenterology;  Laterality: N/A;   COLONOSCOPY WITH PROPOFOL N/A 11/16/2020   Procedure: COLONOSCOPY WITH PROPOFOL;  Surgeon: Wyline Mood, MD;  Location: [facility] ENDOSCOPY;  Service: Gastroenterology;  Laterality: N/A;   KNEE ARTHROSCOPY Right    PROSTATECTOMY     thyroid biop      Family History  Problem Relation Age of Onset   Emphysema Father    Diabetes Sister     Allergies  Allergen Reactions   Lidocaine     Cut off wind    Current Outpatient Medications on File Prior to Visit  Medication Sig Dispense Refill   apixaban (ELIQUIS) 2.5 MG TABS tablet Take 1 tablet (2.5 mg total) by mouth 2 (two) times daily. 180 tablet 1   atorvastatin (LIPITOR) 40 MG tablet Take 1 tablet (40 mg  total) by mouth daily. for cholesterol. 90 tablet 3   Coenzyme Q10 (COQ10 PO) Take 1 capsule by mouth daily.      GLUCOSAMINE SULFATE PO Take by mouth.     losartan-hydrochlorothiazide (HYZAAR) 100-12.5 MG tablet TAKE 1 TABLET DAILY FOR    BLOOD PRESSURE 90 tablet 1   Multiple Vitamin (MULTIVITAMIN) tablet Take 1 tablet by mouth daily.     Omega-3 Fatty Acids (FISH OIL) 1000 MG CAPS Take 1,000 mg by mouth 2 (two) times daily.     sertraline (ZOLOFT) 50 MG tablet TAKE 1 TABLET (50 MG TOTAL) BY MOUTH DAILY. FOR ANXIETY. 90 tablet 3   amLODipine (NORVASC) 10 MG tablet Take 1 tablet (10 mg total)  by mouth daily. for blood pressure. (Patient not taking: Reported on 02/19/2023) 90 tablet 3   triamcinolone cream (KENALOG) 0.1 % Apply 1 application. topically See admin instructions. Use twice daily for  5 days weekly as needed to affected areas of body for psoriasis. Avoid applying to face, groin, and axilla. Use as directed. (Patient not taking: Reported on 02/19/2023) 80 g 2   No current facility-administered medications on file prior to visit.    BP (!) 178/80   Pulse 93   Temp (!) 97.3 F (36.3 C) (Temporal)   Ht 6\' 2"  (1.88 m)   Wt 250 lb (113.4 kg)   SpO2 95%   BMI 32.10 kg/m  Objective:   Physical Exam Constitutional:      Appearance: He is not ill-appearing.     Comments: Appears uncomfortable  Cardiovascular:     Rate and Rhythm: Normal rate and regular rhythm.  Pulmonary:     Effort: Pulmonary effort is normal.     Breath sounds: Normal breath sounds.  Abdominal:     Tenderness: There is no right CVA tenderness or left CVA tenderness.  Musculoskeletal:     Lumbar back: Tenderness present.       Back:           Assessment & Plan:  Gross hematuria Assessment & Plan: Symptoms suspicious for renal stone.  Stat CT renal stone protocol ordered and pending. Urinalysis with 3+ blood, otherwise negative.  Culture ordered and pending.  Start tamsulosin 0.4 mg daily.   Continue to increase water intake.  Continue tramadol 50 mg as needed. Strict ED precautions provided.  Orders: -     POCT Urinalysis Dipstick (Automated) -     CT RENAL STONE STUDY; Future -     Tamsulosin HCl; Take 1 capsule (0.4 mg total) by mouth daily.  Dispense: 30 capsule; Refill: 0  Flank pain Assessment & Plan: Symptoms suspicious for renal stone.  Stat CT renal stone protocol ordered and pending. Urinalysis with 3+ blood, otherwise negative.  Culture ordered and pending.  Start tamsulosin 0.4 mg daily.  Continue to increase water intake.  Continue tramadol 50 mg as needed. Strict ED precautions provided.  Orders: -     POCT Urinalysis Dipstick (Automated) -     CT RENAL STONE STUDY; Future -     Tamsulosin HCl; Take 1 capsule (0.4 mg total) by mouth daily.  Dispense: 30 capsule; Refill: 0        Doreene Nest, NP

## 2023-02-20 LAB — URINE CULTURE
MICRO NUMBER:: 15118995
Result:: NO GROWTH
SPECIMEN QUALITY:: ADEQUATE

## 2023-03-18 ENCOUNTER — Other Ambulatory Visit: Payer: Self-pay | Admitting: Primary Care

## 2023-03-18 DIAGNOSIS — R310 Gross hematuria: Secondary | ICD-10-CM

## 2023-03-18 DIAGNOSIS — R109 Unspecified abdominal pain: Secondary | ICD-10-CM

## 2023-04-25 ENCOUNTER — Other Ambulatory Visit: Payer: Self-pay | Admitting: Primary Care

## 2023-04-25 DIAGNOSIS — I10 Essential (primary) hypertension: Secondary | ICD-10-CM

## 2023-04-25 DIAGNOSIS — E785 Hyperlipidemia, unspecified: Secondary | ICD-10-CM

## 2023-04-30 ENCOUNTER — Other Ambulatory Visit: Payer: Self-pay | Admitting: Primary Care

## 2023-04-30 DIAGNOSIS — I10 Essential (primary) hypertension: Secondary | ICD-10-CM

## 2023-05-10 ENCOUNTER — Encounter: Payer: Self-pay | Admitting: Dermatology

## 2023-05-10 ENCOUNTER — Ambulatory Visit (INDEPENDENT_AMBULATORY_CARE_PROVIDER_SITE_OTHER): Payer: Medicare Other | Admitting: Dermatology

## 2023-05-10 VITALS — BP 129/77

## 2023-05-10 DIAGNOSIS — D229 Melanocytic nevi, unspecified: Secondary | ICD-10-CM

## 2023-05-10 DIAGNOSIS — Z79899 Other long term (current) drug therapy: Secondary | ICD-10-CM

## 2023-05-10 DIAGNOSIS — D1801 Hemangioma of skin and subcutaneous tissue: Secondary | ICD-10-CM

## 2023-05-10 DIAGNOSIS — W908XXA Exposure to other nonionizing radiation, initial encounter: Secondary | ICD-10-CM

## 2023-05-10 DIAGNOSIS — Z23 Encounter for immunization: Secondary | ICD-10-CM | POA: Diagnosis not present

## 2023-05-10 DIAGNOSIS — Z1283 Encounter for screening for malignant neoplasm of skin: Secondary | ICD-10-CM

## 2023-05-10 DIAGNOSIS — Z85828 Personal history of other malignant neoplasm of skin: Secondary | ICD-10-CM

## 2023-05-10 DIAGNOSIS — L821 Other seborrheic keratosis: Secondary | ICD-10-CM | POA: Diagnosis not present

## 2023-05-10 DIAGNOSIS — L720 Epidermal cyst: Secondary | ICD-10-CM

## 2023-05-10 DIAGNOSIS — Z872 Personal history of diseases of the skin and subcutaneous tissue: Secondary | ICD-10-CM | POA: Diagnosis not present

## 2023-05-10 DIAGNOSIS — L409 Psoriasis, unspecified: Secondary | ICD-10-CM

## 2023-05-10 DIAGNOSIS — L814 Other melanin hyperpigmentation: Secondary | ICD-10-CM | POA: Diagnosis not present

## 2023-05-10 DIAGNOSIS — Z7189 Other specified counseling: Secondary | ICD-10-CM

## 2023-05-10 DIAGNOSIS — L578 Other skin changes due to chronic exposure to nonionizing radiation: Secondary | ICD-10-CM

## 2023-05-10 DIAGNOSIS — L729 Follicular cyst of the skin and subcutaneous tissue, unspecified: Secondary | ICD-10-CM

## 2023-05-10 DIAGNOSIS — L820 Inflamed seborrheic keratosis: Secondary | ICD-10-CM

## 2023-05-10 NOTE — Progress Notes (Signed)
Follow-Up Visit   Subjective  Curtis Black is a 82 y.o. male who presents for the following: Skin Cancer Screening and Full Body Skin Exam, hx of BCCs, hx of Aks, Psoriasis R hip, TMC 0.1% cr prn, growths around neck irritated with clothing  The patient presents for Total-Body Skin Exam (TBSE) for skin cancer screening and mole check. The patient has spots, moles and lesions to be evaluated, some may be new or changing and the patient may have concern these could be cancer.    The following portions of the chart were reviewed this encounter and updated as appropriate: medications, allergies, medical history  Review of Systems:  No other skin or systemic complaints except as noted in HPI or Assessment and Plan.  Objective  Well appearing patient in no apparent distress; mood and affect are within normal limits.  A full examination was performed including scalp, head, eyes, ears, nose, lips, neck, chest, axillae, abdomen, back, buttocks, bilateral upper extremities, bilateral lower extremities, hands, feet, fingers, toes, fingernails, and toenails. All findings within normal limits unless otherwise noted below.   Relevant physical exam findings are noted in the Assessment and Plan.  neck, chest x 21 (21) Stuck on waxy paps with erythema    Assessment & Plan   SKIN CANCER SCREENING PERFORMED TODAY.  ACTINIC DAMAGE - Chronic condition, secondary to cumulative UV/sun exposure - diffuse scaly erythematous macules with underlying dyspigmentation - Recommend daily broad spectrum sunscreen SPF 30+ to sun-exposed areas, reapply every 2 hours as needed.  - Staying in the shade or wearing long sleeves, sun glasses (UVA+UVB protection) and wide brim hats (4-inch brim around the entire circumference of the hat) are also recommended for sun protection.  - Call for new or changing lesions.  LENTIGINES, SEBORRHEIC KERATOSES, HEMANGIOMAS - Benign normal skin lesions - Benign-appearing - Call for any  changes  MELANOCYTIC NEVI - Tan-brown and/or pink-flesh-colored symmetric macules and papules - Benign appearing on exam today - Observation - Call clinic for new or changing moles - Recommend daily use of broad spectrum spf 30+ sunscreen to sun-exposed areas.   HISTORY OF BASAL CELL CARCINOMA OF THE SKIN - No evidence of recurrence today - Recommend regular full body skin exams - Recommend daily broad spectrum sunscreen SPF 30+ to sun-exposed areas, reapply every 2 hours as needed.  - Call if any new or changing lesions are noted between office visits  -L medial infraorbital lat nose inf to med canthus, sup to glabella,   EPIDERMAL INCLUSION CYST L upper back paraspinal Exam: Subcutaneous nodule at L upper back paraspinal 0.7cm  Benign-appearing. Exam most consistent with an epidermal inclusion cyst. Discussed that a cyst is a benign growth that can grow over time and sometimes get irritated or inflamed. Recommend observation if it is not bothersome. Discussed option of surgical excision to remove it if it is growing, symptomatic, or other changes noted. Please call for new or changing lesions so they can be evaluated.  PSORIASIS R hip Exam: R hip clear 0% BSA.  Chronic condition with duration or expected duration over one year. Currently well-controlled.   patient denies joint pain  Psoriasis is a chronic non-curable, but treatable genetic/hereditary disease that may have other systemic features affecting other organ systems such as joints (Psoriatic Arthritis). It is associated with an increased risk of inflammatory bowel disease, heart disease, non-alcoholic fatty liver disease, and depression.  Treatments include light and laser treatments; topical medications; and systemic medications including oral and injectables.  Treatment  Plan: Cont TMC 0.1% cr qd up to 5d/wk prn flares, avoid f/g/a (pt will call for refills)   Inflamed seborrheic keratosis (21) neck, chest x  21  Symptomatic, irritating, patient would like treated.  Destruction of lesion - neck, chest x 21 (21) Complexity: simple   Destruction method: cryotherapy   Informed consent: discussed and consent obtained   Timeout:  patient name, date of birth, surgical site, and procedure verified Lesion destroyed using liquid nitrogen: Yes   Region frozen until ice ball extended beyond lesion: Yes   Outcome: patient tolerated procedure well with no complications   Post-procedure details: wound care instructions given     Topical steroids (such as triamcinolone, fluocinolone, fluocinonide, mometasone, clobetasol, halobetasol, betamethasone, hydrocortisone) can cause thinning and lightening of the skin if they are used for too long in the same area. Your physician has selected the right strength medicine for your problem and area affected on the body. Please use your medication only as directed by your physician to prevent side effects.    Long term medication management.  Patient is using long term (months to years) prescription medication  to control their dermatologic condition.  These medications require periodic monitoring to evaluate for efficacy and side effects and may require periodic laboratory monitoring.   HISTORY OF PRECANCEROUS ACTINIC KERATOSIS - site(s) of PreCancerous Actinic Keratosis clear today. - these may recur and new lesions may form requiring treatment to prevent transformation into skin cancer - observe for new or changing spots and contact [facility] for appointment if occur - photoprotection with sun protective clothing; sunglasses and broad spectrum sunscreen with SPF of at least 30 + and frequent self skin exams recommended - yearly exams by a dermatologist recommended for persons with history of PreCancerous Actinic Keratoses   Return in about 1 year (around 05/09/2024) for TBSE, Hx of BCC, Hx of AKs.  I, Ardis Rowan, RMA, am acting as scribe for Armida Sans, MD .   Documentation: I have reviewed the above documentation for accuracy and completeness, and I agree with the above.  Armida Sans, MD

## 2023-05-10 NOTE — Patient Instructions (Addendum)

## 2023-05-13 ENCOUNTER — Encounter: Payer: Self-pay | Admitting: Dermatology

## 2023-06-10 DIAGNOSIS — Z23 Encounter for immunization: Secondary | ICD-10-CM | POA: Diagnosis not present

## 2023-06-19 ENCOUNTER — Ambulatory Visit: Payer: Medicare Other | Admitting: Oncology

## 2023-06-19 ENCOUNTER — Other Ambulatory Visit: Payer: Medicare Other

## 2023-06-28 ENCOUNTER — Ambulatory Visit: Payer: Medicare Other

## 2023-06-28 VITALS — Ht 74.0 in | Wt 250.0 lb

## 2023-06-28 DIAGNOSIS — Z0000 Encounter for general adult medical examination without abnormal findings: Secondary | ICD-10-CM

## 2023-06-28 NOTE — Patient Instructions (Signed)
Curtis Black , Thank you for taking time to come for your Medicare Wellness Visit. I appreciate your ongoing commitment to your health goals. Please review the following plan we discussed and let me know if I can assist you in the future.   Referrals/Orders/Follow-Ups/Clinician Recommendations: none  This is a list of the screening recommended for you and due dates:  Health Maintenance  Topic Date Due   COVID-19 Vaccine (7 - 2023-24 season) 07/05/2023   Colon Cancer Screening  11/17/2023   Medicare Annual Wellness Visit  06/27/2024   DTaP/Tdap/Td vaccine (2 - Td or Tdap) 11/14/2027   Pneumonia Vaccine  Completed   Flu Shot  Completed   Zoster (Shingles) Vaccine  Completed   HPV Vaccine  Aged Out   Hepatitis C Screening  Discontinued    Advanced directives: (Copy Requested) Please bring a copy of your health care power of attorney and living will to the office to be added to your chart at your convenience.  Next Medicare Annual Wellness Visit scheduled for next year: Yes 07/02/24 @ 3pm telephone

## 2023-06-28 NOTE — Progress Notes (Signed)
Subjective:   Curtis Black. is a 82 y.o. male who presents for Medicare Annual/Subsequent preventive examination.  Visit Complete: Virtual I connected with  Curtis Black. on 06/28/23 by a audio enabled telemedicine application and verified that I am speaking with the correct person using two identifiers.  Patient Location: Home  Provider Location: Office/Clinic  I discussed the limitations of evaluation and management by telemedicine. The patient expressed understanding and agreed to proceed.  Vital Signs: Because this visit was a virtual/telehealth visit, some criteria may be missing or patient reported. Any vitals not documented were not able to be obtained and vitals that have been documented are patient reported.  Patient Medicare AWV questionnaire was completed by the patient on (not done); I have confirmed that all information answered by patient is correct and no changes since this date. Cardiac Risk Factors include: advanced age (>61men, >35 women);male gender;dyslipidemia;hypertension;obesity (BMI >30kg/m2)    Objective:    Today's Vitals   06/28/23 1508  Weight: 250 lb (113.4 kg)  Height: 6\' 2"  (1.88 m)   Body mass index is 32.1 kg/m.     06/28/2023    3:32 PM 12/15/2022    9:36 AM 06/15/2022    3:00 PM 06/13/2022   12:01 PM 03/29/2022    9:27 AM 01/26/2021   12:32 PM 01/05/2021   11:20 AM  Advanced Directives  Does Patient Have a Medical Advance Directive? Yes Yes Yes Yes Yes Yes Yes  Type of Estate agent of [place];Living will Living will;Healthcare Power of [employer] Power of [employer] Power of [employer] Power of [place];Living will Healthcare Power of [place];Living will  Does patient want to make changes to medical advance directive?      No - Guardian declined No - Patient declined  Copy of Healthcare Power of Attorney in Chart? No - copy requested No - copy requested Yes - validated most recent  copy scanned in chart (See row information) Yes - validated most recent copy scanned in chart (See row information)  Yes - validated most recent copy scanned in chart (See row information) No - copy requested  Would patient like information on creating a medical advance directive?   No - Patient declined No - Patient declined       Current Medications (verified) Outpatient Encounter Medications as of 06/28/2023  Medication Sig   atorvastatin (LIPITOR) 40 MG tablet Take 1 tablet (40 mg total) by mouth daily. for cholesterol.   Coenzyme Q10 (COQ10 PO) Take 1 capsule by mouth daily.    GLUCOSAMINE SULFATE PO Take by mouth.   losartan-hydrochlorothiazide (HYZAAR) 100-12.5 MG tablet TAKE 1 TABLET DAILY FOR    BLOOD PRESSURE   Multiple Vitamin (MULTIVITAMIN) tablet Take 1 tablet by mouth daily.   Omega-3 Fatty Acids (FISH OIL) 1000 MG CAPS Take 1,000 mg by mouth 2 (two) times daily.   sertraline (ZOLOFT) 50 MG tablet TAKE 1 TABLET (50 MG TOTAL) BY MOUTH DAILY. FOR ANXIETY.   amLODipine (NORVASC) 10 MG tablet Take 1 tablet (10 mg total) by mouth daily. for blood pressure. (Patient not taking: Reported on 02/19/2023)   apixaban (ELIQUIS) 2.5 MG TABS tablet Take 1 tablet (2.5 mg total) by mouth 2 (two) times daily. (Patient not taking: Reported on 05/10/2023)   tamsulosin (FLOMAX) 0.4 MG CAPS capsule Take 1 capsule (0.4 mg total) by mouth daily. (Patient not taking: Reported on 06/28/2023)   triamcinolone cream (KENALOG) 0.1 % Apply 1 application. topically See  admin instructions. Use twice daily for  5 days weekly as needed to affected areas of body for psoriasis. Avoid applying to face, groin, and axilla. Use as directed. (Patient not taking: Reported on 02/19/2023)   No facility-administered encounter medications on file as of 06/28/2023.    Allergies (verified) Lidocaine   History: Past Medical History:  Diagnosis Date   Actinic keratosis    Anxiety    Basal cell carcinoma 03/06/2017   left  medial infraorbital lat nose inf to med canthus   Basal cell carcinoma 06/12/2019   sup to glabella   Basal cell carcinoma 12/15/2021   superior to glabella,  Mohs 03/28/2022   Basal cell carcinoma (BCC) of nostril 06/12/2019   Removed, no other intervention needed per pt.   CKD (chronic kidney disease) stage 3, GFR 30-59 ml/min (HCC)    Essential hypertension    Gross hematuria 07/29/2021   Hyperlipidemia    Prostate cancer (HCC) 2009   Psoriasis    Past Surgical History:  Procedure Laterality Date   APPENDECTOMY     BOWEL RESECTION     CATARACT EXTRACTION W/PHACO Right 01/05/2021   Procedure: CATARACT EXTRACTION PHACO AND INTRAOCULAR LENS PLACEMENT (IOC) RIGHT OMIDRIA;  Surgeon: Lockie Mola, MD;  Location: [facility];  Service: Ophthalmology;  Laterality: Right;  cde 5.46 01:01.7 minutes 8.9%   CATARACT EXTRACTION W/PHACO Left 01/26/2021   Procedure: CATARACT EXTRACTION PHACO AND INTRAOCULAR LENS PLACEMENT (IOC) LEFT OMIDRIA 5.35 00:50.6;  Surgeon: Lockie Mola, MD;  Location: [facility];  Service: Ophthalmology;  Laterality: Left;   COLON SURGERY     COLONOSCOPY WITH PROPOFOL N/A 06/05/2017   Procedure: COLONOSCOPY WITH PROPOFOL;  Surgeon: Wyline Mood, MD;  Location: [facility];  Service: Gastroenterology;  Laterality: N/A;   COLONOSCOPY WITH PROPOFOL N/A 06/15/2020   Procedure: COLONOSCOPY WITH PROPOFOL;  Surgeon: Wyline Mood, MD;  Location: [facility];  Service: Gastroenterology;  Laterality: N/A;   COLONOSCOPY WITH PROPOFOL N/A 11/16/2020   Procedure: COLONOSCOPY WITH PROPOFOL;  Surgeon: Wyline Mood, MD;  Location: [facility] END[facility]COPY;  Service: Gastroenterology;  Laterality: N/A;   KNEE ARTHROSCOPY Right    PROSTATECTOMY     thyroid biop     Family History  Problem Relation Age of Onset   Emphysema Father    Diabetes Sister    Social History   Socioeconomic History   Marital status: Married    Spouse name: Not on file   Number of  children: Not on file   Years of education: Not on file   Highest education level: Doctorate  Occupational History   Not on file  Tobacco Use   Smoking status: Former    Current packs/day: 0.00    Average packs/day: 2.0 packs/day for 15.0 years (30.0 ttl pk-yrs)    Types: Cigarettes    Start date: 5    Quit date: 70    Years since quitting: 26.8   Smokeless tobacco: Never  Vaping Use   Vaping status: Never Used  Substance and Sexual Activity   Alcohol use: Yes    Alcohol/week: 7.0 standard drinks of alcohol    Types: 7 Glasses of wine per week    Comment: occasionally   Drug use: Not Currently   Sexual activity: Not Currently  Other Topics Concern   Not on file  Social History Narrative   Married.   No children.   Retired. Once worked for [employer].   Enjoys working on his house, playing golf, riding his bike.  Social Determinants of Health   Financial Resource Strain: Low Risk  (06/28/2023)   Overall Financial Resource Strain (CARDIA)    Difficulty of Paying Living Expenses: Not hard at all  Food Insecurity: No Food Insecurity (06/28/2023)   Hunger Vital Sign    Worried About Running Out of Food in the Last Year: Never true    Ran Out of Food in the Last Year: Never true  Transportation Needs: No Transportation Needs (06/28/2023)   PRAPARE - Administrator, Civil Service (Medical): No    Lack of Transportation (Non-Medical): No  Physical Activity: Sufficiently Active (06/28/2023)   Exercise Vital Sign    Days of Exercise per Week: 3 days    Minutes of Exercise per Session: 150+ min  Stress: No Stress Concern Present (06/28/2023)   [employer] of Occupational Health - Occupational Stress Questionnaire    Feeling of Stress : Not at all  Social Connections: Moderately Isolated (06/28/2023)   Social Connection and Isolation Panel [NHANES]    Frequency of Communication with Friends and Family: More than three times a week    Frequency of Social  Gatherings with Friends and Family: Once a week    Attends Religious Services: Never    Database administrator or Organizations: No    Attends Engineer, structural: Never    Marital Status: Married    Tobacco Counseling Counseling given: Not Answered   Clinical Intake:  Pre-visit preparation completed: No  Pain : No/denies pain     BMI - recorded: 32.1 Nutritional Status: BMI > 30  Obese Nutritional Risks: None Diabetes: No  How often do you need to have someone help you when you read instructions, pamphlets, or other written materials from your doctor or pharmacy?: 1 - Never  Interpreter Needed?: No  Comments: lives with wife Information entered by :: B.Besse Miron,LPN   Activities of Daily Living    06/28/2023    3:19 PM 07/04/2022    8:11 AM  In your present state of health, do you have any difficulty performing the following activities:  Hearing? 0 0  Vision? 0 0  Difficulty concentrating or making decisions? 0 0  Walking or climbing stairs? 0 1  Dressing or bathing? 0 0  Doing errands, shopping? 0 0  Preparing Food and eating ? N   Using the Toilet? N   In the past six months, have you accidently leaked urine? N   Do you have problems with loss of bowel control? N   Managing your Medications? N   Managing your Finances? N   Housekeeping or managing your Housekeeping? N     Patient Care Team: Doreene Nest, NP as PCP - General (Internal Medicine) Eda Keys as Consulting Physician (Dentistry) Deirdre Evener, MD (Dermatology) Lockie Mola, MD as Referring Physician (Ophthalmology)  Indicate any recent Medical Services you may have received from other than [facility] providers in the past year (date may be approximate).     Assessment:   This is a routine wellness examination for Jahki.  Hearing/Vision screen Hearing Screening - Comments:: Pt says his hearing is good Vision Screening - Comments:: Pt says his vision is good Dr  Inez Pilgrim    Goals Addressed             This Visit's Progress    COMPLETED: Exercise 6x per week (30 min per time)   On track    Starting 04/19/2018, I will continue working out 6x per week for  1.5-5 hours per day.       COMPLETED: Patient Stated   On track    05/25/2020, I will continue to play golf 3 days a week for 4-5 hours.     COMPLETED: Patient Stated   On track    Continue current lifestyle        Depression Screen    06/28/2023    3:15 PM 02/19/2023   11:14 AM 07/04/2022    8:10 AM 06/13/2022   12:08 PM 05/31/2021    9:36 AM 05/25/2020    2:04 PM 05/15/2019    7:39 AM  PHQ 2/9 Scores  PHQ - 2 Score 1 0 0 0 0 0 0  PHQ- 9 Score   0 0  0     Fall Risk    06/28/2023    3:11 PM 02/19/2023   11:14 AM 07/04/2022    8:10 AM 06/13/2022   12:02 PM 05/31/2021    9:35 AM  Fall Risk   Falls in the past year? 0 0 1 0 0  Number falls in past yr: 0 0 1 0 0  Injury with Fall? 0 0 0 0 0  Risk for fall due to : No Fall Risks No Fall Risks     Follow up Education provided;Falls prevention discussed Falls evaluation completed  Falls evaluation completed;Education provided;Falls prevention discussed     MEDICARE RISK AT HOME: Medicare Risk at Home Any stairs in or around the home?: Yes If so, are there any without handrails?: Yes Home free of loose throw rugs in walkways, pet beds, electrical cords, etc?: Yes Adequate lighting in your home to reduce risk of falls?: Yes Life alert?: No Use of a cane, walker or w/c?: No Grab bars in the bathroom?: Yes Shower chair or bench in shower?: Yes Elevated toilet seat or a handicapped toilet?: Yes  TIMED UP AND GO:  Was the test performed?  No    Cognitive Function:    05/25/2020    2:09 PM 04/19/2018    8:19 AM 03/30/2017    8:09 AM  MMSE - Mini Mental State Exam  Orientation to time 5 5 5   Orientation to Place 5 5 5   Registration 3 3 3   Attention/ Calculation 5 0 0  Recall 3 3 3   Language- name 2 objects  0 0  Language-  repeat 1 1 1   Language- follow 3 step command  3 3  Language- read & follow direction  0 0  Write a sentence  0 0  Copy design  0 0  Total score  20 20        06/28/2023    3:24 PM 06/13/2022   12:03 PM  6CIT Screen  What Year? 0 points 0 points  What month? 0 points 0 points  What time? 0 points 0 points  Count back from 20 0 points 0 points  Months in reverse 0 points 0 points  Repeat phrase 0 points 0 points  Total Score 0 points 0 points    Immunizations Immunization History  Administered Date(s) Administered   Fluad Quad(high Dose 65+) 05/09/2019, 06/10/2021, 06/01/2022   Influenza, High Dose Seasonal PF 05/26/2014, 06/13/2017, 05/28/2018, 05/13/2019, 05/19/2020   PFIZER Comirnaty(Gray Top)Covid-19 Tri-Sucrose Vaccine 12/30/2020   PFIZER(Purple Top)SARS-COV-2 Vaccination 10/02/2019, 10/23/2019, 06/08/2020   Palivizumab 04/17/2022   Pfizer Covid-19 Vaccine Bivalent Booster 98yrs & up 06/23/2021   Pneumococcal Conjugate-13 04/06/2017   Pneumococcal Polysaccharide-23 01/05/2010, 06/18/2018, 04/29/2019   RSV,unspecified 05/10/2023   Tdap  11/13/2017   Unspecified SARS-COV-2 Vaccination 05/10/2023   Zoster Recombinant(Shingrix) 05/28/2018, 08/26/2018, 04/29/2019   Zoster, Live 08/28/2014    TDAP status: Up to date  Flu Vaccine status: Up to date  Pneumococcal vaccine status: Up to date  Covid-19 vaccine status: Completed vaccines  Qualifies for Shingles Vaccine? Yes   Zostavax completed Yes   Shingrix Completed?: Yes  Screening Tests Health Maintenance  Topic Date Due   COVID-19 Vaccine (7 - 2023-24 season) 07/05/2023   Colonoscopy  11/17/2023   Medicare Annual Wellness (AWV)  06/27/2024   DTaP/Tdap/Td (2 - Td or Tdap) 11/14/2027   Pneumonia Vaccine 29+ Years old  Completed   INFLUENZA VACCINE  Completed   Zoster Vaccines- Shingrix  Completed   HPV VACCINES  Aged Out   Hepatitis C Screening  Discontinued    Health Maintenance  There are no preventive  care reminders to display for this patient.   Colorectal cancer screening: Type of screening: Colonoscopy. Completed  . Repeat every 11/16/20 years  Lung Cancer Screening: (Low Dose CT Chest recommended if Age 55-80 years, 20 pack-year currently smoking OR have quit w/in 15years.) does not qualify.   Lung Cancer Screening Referral: no  Additional Screening:  Hepatitis C Screening: does not qualify; Completed DISCONTINUED  Vision Screening: Recommended annual ophthalmology exams for early detection of glaucoma and other disorders of the eye. Is the patient up to date with their annual eye exam?  Yes  Who is the provider or what is the name of the office in which the patient attends annual eye exams? Dr Inez Pilgrim If pt is not established with a provider, would they like to be referred to a provider to establish care? No .   Dental Screening: Recommended annual dental exams for proper oral hygiene  Diabetic Foot Exam:   Community Resource Referral / Chronic Care Management: CRR required this visit?  No   CCM required this visit?  No     Plan:     I have personally reviewed and noted the following in the patient's chart:   Medical and social history Use of alcohol, tobacco or illicit drugs  Current medications and supplements including opioid prescriptions. Patient is not currently taking opioid prescriptions. Functional ability and status Nutritional status Physical activity Advanced directives List of other physicians Hospitalizations, surgeries, and ER visits in previous 12 months Vitals Screenings to include cognitive, depression, and falls Referrals and appointments  In addition, I have reviewed and discussed with patient certain preventive protocols, quality metrics, and best practice recommendations. A written personalized care plan for preventive services as well as general preventive health recommendations were provided to patient.     Sue Lush,  LPN   08/30/7251   After Visit Summary: (MyChart) Due to this being a telephonic visit, the after visit summary with patients personalized plan was offered to patient via MyChart   Nurse Notes: The patient states he is doing well and has no concerns or questions at this time.

## 2023-06-30 ENCOUNTER — Other Ambulatory Visit: Payer: Self-pay | Admitting: Primary Care

## 2023-06-30 DIAGNOSIS — F411 Generalized anxiety disorder: Secondary | ICD-10-CM

## 2023-07-10 ENCOUNTER — Encounter: Payer: Medicare Other | Admitting: Primary Care

## 2023-07-11 ENCOUNTER — Ambulatory Visit
Admission: RE | Admit: 2023-07-11 | Discharge: 2023-07-11 | Disposition: A | Discharge status: Home / Self Care | Payer: Medicare Other | Source: Ambulatory Visit | Attending: Primary Care

## 2023-07-11 ENCOUNTER — Ambulatory Visit (INDEPENDENT_AMBULATORY_CARE_PROVIDER_SITE_OTHER): Payer: Medicare Other | Admitting: Primary Care

## 2023-07-11 ENCOUNTER — Encounter: Payer: Self-pay | Admitting: Primary Care

## 2023-07-11 VITALS — BP 118/66 | HR 55 | Temp 97.2°F | Ht 74.0 in | Wt 255.0 lb

## 2023-07-11 DIAGNOSIS — I700 Atherosclerosis of aorta: Secondary | ICD-10-CM | POA: Diagnosis not present

## 2023-07-11 DIAGNOSIS — G8929 Other chronic pain: Secondary | ICD-10-CM

## 2023-07-11 DIAGNOSIS — Z86718 Personal history of other venous thrombosis and embolism: Secondary | ICD-10-CM | POA: Diagnosis not present

## 2023-07-11 DIAGNOSIS — N1831 Chronic kidney disease, stage 3a: Secondary | ICD-10-CM

## 2023-07-11 DIAGNOSIS — Z87891 Personal history of nicotine dependence: Secondary | ICD-10-CM | POA: Diagnosis not present

## 2023-07-11 DIAGNOSIS — R053 Chronic cough: Secondary | ICD-10-CM

## 2023-07-11 DIAGNOSIS — M5441 Lumbago with sciatica, right side: Secondary | ICD-10-CM | POA: Diagnosis not present

## 2023-07-11 DIAGNOSIS — Z8546 Personal history of malignant neoplasm of prostate: Secondary | ICD-10-CM | POA: Diagnosis not present

## 2023-07-11 DIAGNOSIS — E785 Hyperlipidemia, unspecified: Secondary | ICD-10-CM

## 2023-07-11 DIAGNOSIS — Z125 Encounter for screening for malignant neoplasm of prostate: Secondary | ICD-10-CM

## 2023-07-11 DIAGNOSIS — I10 Essential (primary) hypertension: Secondary | ICD-10-CM | POA: Diagnosis not present

## 2023-07-11 DIAGNOSIS — R918 Other nonspecific abnormal finding of lung field: Secondary | ICD-10-CM | POA: Diagnosis not present

## 2023-07-11 DIAGNOSIS — R7303 Prediabetes: Secondary | ICD-10-CM

## 2023-07-11 DIAGNOSIS — F411 Generalized anxiety disorder: Secondary | ICD-10-CM | POA: Diagnosis not present

## 2023-07-11 DIAGNOSIS — Z1211 Encounter for screening for malignant neoplasm of colon: Secondary | ICD-10-CM

## 2023-07-11 DIAGNOSIS — R001 Bradycardia, unspecified: Secondary | ICD-10-CM

## 2023-07-11 HISTORY — DX: Chronic cough: R05.3

## 2023-07-11 NOTE — Progress Notes (Addendum)
Subjective:    Patient ID: Curtis Leyden., male    DOB: 05-19-1941, 82 y.o.   MRN: [id number]  HPI  Curtis Oder. is a very pleasant 82 y.o. male with a history of CKD, hypertension, chronic sinus bradycardia, DVT, GAD, hyperlipidemia, prediabetes, chronic back pain who presents today for follow-up of chronic conditions.  1) Hypertension: Currently managed on losartan-hydrochlorothiazide 100-12.5 mg daily and amlodipine 10 mg daily.  He denies chest pain, dizziness, shortness of breath.  2) Hyperlipidemia/Prediabetes: Currently managed on atorvastatin 40 mg daily.  He is due for repeat lipid panel today.  3) GAD: Currently managed on sertraline 50 mg daily. Overall feels well managed. Denies concerns today.  4) History of DVT: Previously managed on Eliquis 2.5 mg BID, but he's not been taking for the last 1 month as his hematologist did not refill. Last office visit was in April 2024, plan was to reduce to 2.5 mg BID and continue long term.   5) Chronic Cough: Chronic for the last 8 months. Prior smoker, quit smoking 30 years ago. He first notices the cough when first getting out of bed, and intermittently during the day. His wife is now noticing the cough. His cough is dry mostly. He does have post nasal drip.   He denies esophageal burning, fevers, chills, unexplained weight loss, hemoptysis. He does notice shortness of breath with inclines. He plays golf without difficulty, he drives his holes. He doesn't not exercise. He has not taken anything OTC.   BP Readings from Last 3 Encounters:  07/11/23 118/66  05/10/23 129/77  02/19/23 (!) 178/80   Immunizations: -Tetanus: Completed in 2019 -Influenza: Completed this season -Shingles: Completed Shingrix series -Pneumonia: Completed Prevnar 13 in 2018, Pneumovax 23 in 2020   Colonoscopy: Completed in 2022, due in March 2025.   PSA: Due   Wt Readings from Last 3 Encounters:  07/11/23 255 lb (115.7 kg)  06/28/23 250 lb  (113.4 kg)  02/19/23 250 lb (113.4 kg)        Review of Systems  Respiratory:  Negative for shortness of breath.   Cardiovascular:  Negative for chest pain.  Gastrointestinal:  Negative for constipation and diarrhea.  Neurological:  Negative for headaches.  Psychiatric/Behavioral:  The patient is not nervous/anxious.          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   CKD (chronic kidney disease) stage 3, GFR 30-59 ml/min (HCC)    Essential hypertension    Gross hematuria 07/29/2021   Hyperlipidemia    Prostate cancer (HCC) 2009   Psoriasis     Social History   Socioeconomic History   Marital status: Married    Spouse name: Not on file   Number of children: Not on file   Years of education: Not on file   Highest education level: Doctorate  Occupational History   Not on file  Tobacco Use   Smoking status: Former    Current packs/day: 0.00    Average packs/day: 2.0 packs/day for 15.0 years (30.0 ttl pk-yrs)    Types: Cigarettes    Start  date: 10    Quit date: 1998    Years since quitting: 26.8   Smokeless tobacco: Never  Vaping Use   Vaping status: Never Used  Substance and Sexual Activity   Alcohol use: Yes    Alcohol/week: 7.0 standard drinks of alcohol    Types: 7 Glasses of wine per week    Comment: occasionally   Drug use: Not Currently   Sexual activity: Not Currently  Other Topics Concern   Not on file  Social History Narrative   Married.   No children.   Retired. Once worked for [employer].   Enjoys working on his house, playing golf, riding his bike.    Social Determinants of Health   Financial  Resource Strain: Low Risk  (07/04/2023)   Overall Financial Resource Strain (CARDIA)    Difficulty of Paying Living Expenses: Not very hard  Food Insecurity: No Food Insecurity (07/04/2023)   Hunger Vital Sign    Worried About Running Out of Food in the Last Year: Never true    Ran Out of Food in the Last Year: Never true  Transportation Needs: No Transportation Needs (07/04/2023)   PRAPARE - Administrator, Civil Service (Medical): No    Lack of Transportation (Non-Medical): No  Physical Activity: Sufficiently Active (07/04/2023)   Exercise Vital Sign    Days of Exercise per Week: 3 days    Minutes of Exercise per Session: 150+ min  Stress: No Stress Concern Present (07/04/2023)   [facility] [employer] - Occupational Stress Questionnaire    Feeling of Stress : Only a little  Social Connections: Unknown (07/04/2023)   Social Connection and Isolation Panel [NHANES]    Frequency of Communication with Friends and Family: More than three times a week    Frequency of Social Gatherings with Friends and Family: More than three times a week    Attends Religious Services: Patient declined    Database administrator or Organizations: Yes    Attends Engineer, structural: More than 4 times per year    Marital Status: Married  Recent Concern: Social Connections - Moderately Isolated (06/28/2023)   Social Connection and Isolation Panel [NHANES]    Frequency of Communication with Friends and Family: More than three times a week    Frequency of Social Gatherings with Friends and Family: Once a week    Attends Religious Services: Never    Database administrator or Organizations: No    Attends Banker Meetings: Never    Marital Status: Married  Catering manager Violence: Not At Risk (06/28/2023)   Humiliation, Afraid, Rape, and Kick questionnaire    Fear of Current or Ex-Partner: No    Emotionally Abused: No    Physically Abused: No    Sexually  Abused: No    Past Surgical History:  Procedure Laterality Date   APPENDECTOMY     BOWEL RESECTION     CATARACT EXTRACTION W/PHACO Right 01/05/2021   Procedure: CATARACT EXTRACTION PHACO AND INTRAOCULAR LENS PLACEMENT (IOC) RIGHT OMIDRIA;  Surgeon: Lockie Mola, MD;  Location: [facility];  Service: Ophthalmology;  Laterality: Right;  cde 5.46 01:01.7 minutes 8.9%   CATARACT EXTRACTION W/PHACO Left 01/26/2021   Procedure: CATARACT EXTRACTION PHACO AND INTRAOCULAR LENS PLACEMENT (IOC) LEFT OMIDRIA 5.35 00:50.6;  Surgeon: Lockie Mola, MD;  Location: [facility];  Service: Ophthalmology;  Laterality: Left;   COLON SURGERY     COLONOSCOPY WITH PROPOFOL N/A 06/05/2017   Procedure:  COLONOSCOPY WITH PROPOFOL;  Surgeon: Wyline Mood, MD;  Location[facility];  Service: Gastroenterology;  Laterality: N/A;   COLONOSCOPY WITH PROPOFOL N/A 06/15/2020   Procedure: COLONOSCOPY WITH PROPOFOL;  Surgeon: Wyline Mood, MD;  Location: [facility] ENDOSCOPY;  Service: Gastroenterology;  Laterality: N/A;   COLONOSCOPY WITH PROPOFOL N/A 11/16/2020   Procedure: COLONOSCOPY WITH PROPOFOL;  Surgeon: Wyline Mood, MD;  Location: [facility] ENDOSCOPY;  Service: Gastroenterology;  Laterality: N/A;   KNEE ARTHROSCOPY Right    PROSTATECTOMY     thyroid biop      Family History  Problem Relation Age of Onset   Emphysema Father    Diabetes Sister     Allergies  Allergen Reactions   Lidocaine     Cut off wind    Current Outpatient Medications on File Prior to Visit  Medication Sig Dispense Refill   amLODipine (NORVASC) 10 MG tablet Take 1 tablet (10 mg total) by mouth daily. for blood pressure. 90 tablet 3   atorvastatin (LIPITOR) 40 MG tablet Take 1 tablet (40 mg total) by mouth daily. for cholesterol. 90 tablet 3   Coenzyme Q10 (COQ10 PO) Take 1 capsule by mouth daily.      GLUCOSAMINE SULFATE PO Take by mouth.     losartan-hydrochlorothiazide (HYZAAR) 100-12.5 MG tablet TAKE 1 TABLET  DAILY FOR    BLOOD PRESSURE 90 tablet 0   Multiple Vitamin (MULTIVITAMIN) tablet Take 1 tablet by mouth daily.     Omega-3 Fatty Acids (FISH OIL) 1000 MG CAPS Take 1,000 mg by mouth 2 (two) times daily.     sertraline (ZOLOFT) 50 MG tablet TAKE 1 TABLET (50 MG TOTAL) BY MOUTH DAILY. FOR ANXIETY. 90 tablet 0   apixaban (ELIQUIS) 2.5 MG TABS tablet Take 1 tablet (2.5 mg total) by mouth 2 (two) times daily. (Patient not taking: Reported on 05/10/2023) 180 tablet 1   tamsulosin (FLOMAX) 0.4 MG CAPS capsule Take 1 capsule (0.4 mg total) by mouth daily. (Patient not taking: Reported on 06/28/2023) 30 capsule 0   triamcinolone cream (KENALOG) 0.1 % Apply 1 application. topically See admin instructions. Use twice daily for  5 days weekly as needed to affected areas of body for psoriasis. Avoid applying to face, groin, and axilla. Use as directed. (Patient not taking: Reported on 02/19/2023) 80 g 2   No current facility-administered medications on file prior to visit.    BP 118/66   Pulse (!) 55   Temp (!) 97.2 F (36.2 C) (Temporal)   Ht 6\' 2"  (1.88 m)   Wt 255 lb (115.7 kg)   SpO2 95%   BMI 32.74 kg/m  Objective:   Physical Exam Cardiovascular:     Rate and Rhythm: Normal rate and regular rhythm.  Pulmonary:     Effort: Pulmonary effort is normal.     Breath sounds: Normal breath sounds.  Musculoskeletal:     Cervical back: Neck supple.  Skin:    General: Skin is warm and dry.  Neurological:     Mental Status: He is alert and oriented to person, place, and time.  Psychiatric:        Mood and Affect: Mood normal.           Assessment & Plan:  Prediabetes Assessment & Plan: Repeat A1c pending.  Orders: -     Hemoglobin A1c  Screening for colon cancer -     Ambulatory referral to Gastroenterology  Chronic sinus bradycardia Assessment & Plan: Stable. Asymptomatic.   Continue to monitor.  Essential hypertension Assessment & Plan: Controlled.  Continue  losartan-hydrochlorothiazide 100-12.5 milligrams daily, amlodipine 10 mg daily.  CMP pending.   Stage 3a chronic kidney disease (HCC) Assessment & Plan: Repeat renal function pending.  Continue hydration with water and avoiding NSAIDs.   Chronic right-sided low back pain with right-sided sciatica Assessment & Plan: Stable.  No concerns today.   GAD (generalized anxiety disorder) Assessment & Plan: Controlled.  Continue Zoloft 50 mg daily.   History of DVT (deep vein thrombosis) Assessment & Plan: Reviewed hematology notes from April 2024 which recommend long-term maintenance on Eliquis.  Recommended he contact hematology for clarification as he did not receive refills for his Eliquis.    Hyperlipidemia, unspecified hyperlipidemia type Assessment & Plan: Continue atorvastatin 40 mg daily. Repeat lipid panel.  Orders: -     Lipid panel -     Comprehensive metabolic panel  History of prostate cancer Assessment & Plan: Undetectable levels for years. PSA ordered and pending.  Orders: -     PSA, Medicare  Screening for prostate cancer -     PSA, Medicare  Chronic cough Assessment & Plan: Differentials include COPD, allergies, silent reflux, cancer.   Checking chest xray today.  Discussed to start antihistamine HS. Consider PPI vs ICS.  Await results.   Orders: -     DG Chest 2 View        Doreene Nest, NP

## 2023-07-11 NOTE — Addendum Note (Signed)
Addended by: Doreene Nest on: 07/11/2023 03:21 PM   Modules accepted: Orders

## 2023-07-11 NOTE — Assessment & Plan Note (Addendum)
Continue atorvastatin 40 mg daily. Repeat lipid panel.

## 2023-07-11 NOTE — Assessment & Plan Note (Signed)
Undetectable levels for years. PSA ordered and pending.

## 2023-07-11 NOTE — Assessment & Plan Note (Signed)
Stable. Asymptomatic.  Continue to monitor. 

## 2023-07-11 NOTE — Assessment & Plan Note (Signed)
Controlled.  Continue losartan-hydrochlorothiazide 100-12.5 milligrams daily, amlodipine 10 mg daily.  CMP pending.

## 2023-07-11 NOTE — Assessment & Plan Note (Signed)
Repeat A1c pending. 

## 2023-07-11 NOTE — Assessment & Plan Note (Signed)
Differentials include COPD, allergies, silent reflux, cancer.   Checking chest xray today.  Discussed to start antihistamine HS. Consider PPI vs ICS.  Await results.

## 2023-07-11 NOTE — Assessment & Plan Note (Signed)
Controlled.  Continue Zoloft 50 mg daily.

## 2023-07-11 NOTE — Assessment & Plan Note (Signed)
Repeat renal function pending.  Continue hydration with water and avoiding NSAIDs.

## 2023-07-11 NOTE — Patient Instructions (Addendum)
Stop by the lab and xray prior to leaving today. I will notify you of your results once received.   Please contact her hematologist regarding the Eliquis blood thinner.  You will either be contacted via phone regarding your referral to GI for the colonoscopy, or you may receive a letter on your MyChart portal from our referral team with instructions for scheduling an appointment. Please let us know if you have not been contacted by anyone within two weeks.  Start taking a daily antihistamine at bedtime for your cough.  It was a pleasure to see you today!

## 2023-07-11 NOTE — Assessment & Plan Note (Signed)
Reviewed hematology notes from April 2024 which recommend long-term maintenance on Eliquis.  Recommended he contact hematology for clarification as he did not receive refills for his Eliquis.

## 2023-07-11 NOTE — Assessment & Plan Note (Signed)
Stable. ? ?No concerns today. ?

## 2023-07-12 ENCOUNTER — Other Ambulatory Visit: Payer: Self-pay | Admitting: Primary Care

## 2023-07-12 DIAGNOSIS — I10 Essential (primary) hypertension: Secondary | ICD-10-CM

## 2023-07-12 LAB — LIPID PANEL
Cholesterol: 180 mg/dL (ref 0–200)
HDL: 35.3 mg/dL — ABNORMAL LOW (ref >39.00)
Total CHOL/HDL Ratio: 5
Triglycerides: 584 mg/dL — ABNORMAL HIGH (ref 0.0–149.0)

## 2023-07-12 LAB — COMPREHENSIVE METABOLIC PANEL
ALT: 34 U/L (ref 0–53)
AST: 38 U/L — ABNORMAL HIGH (ref 0–37)
Albumin: 4.7 g/dL (ref 3.5–5.2)
Alkaline Phosphatase: 65 U/L (ref 39–117)
BUN: 24 mg/dL — ABNORMAL HIGH (ref 6–23)
CO2: 29 meq/L (ref 19–32)
Calcium: 9.8 mg/dL (ref 8.4–10.5)
Chloride: 103 meq/L (ref 96–112)
Creatinine, Ser: 1.57 mg/dL — ABNORMAL HIGH (ref 0.40–1.50)
GFR: 41.4 mL/min — ABNORMAL LOW (ref >60.00)
Glucose, Bld: 96 mg/dL (ref 70–99)
Potassium: 3.8 meq/L (ref 3.5–5.1)
Sodium: 141 meq/L (ref 135–145)
Total Bilirubin: 0.5 mg/dL (ref 0.2–1.2)
Total Protein: 7.6 g/dL (ref 6.0–8.3)

## 2023-07-12 LAB — HEMOGLOBIN A1C: Hgb A1c MFr Bld: 6.4 % (ref 4.6–6.5)

## 2023-07-12 LAB — PSA, MEDICARE: PSA: 0.01 ng/mL — ABNORMAL LOW (ref 0.10–4.00)

## 2023-07-12 LAB — LDL CHOLESTEROL, DIRECT: Direct LDL: 98 mg/dL

## 2023-07-15 ENCOUNTER — Encounter: Payer: Self-pay | Admitting: Oncology

## 2023-07-15 DIAGNOSIS — I10 Essential (primary) hypertension: Secondary | ICD-10-CM

## 2023-07-15 DIAGNOSIS — E785 Hyperlipidemia, unspecified: Secondary | ICD-10-CM

## 2023-07-16 ENCOUNTER — Encounter: Payer: Self-pay | Admitting: *Deleted

## 2023-07-16 MED ORDER — AMLODIPINE BESYLATE 10 MG PO TABS: 10.0000 mg | ORAL_TABLET | Freq: Every day | ORAL | 3 refills | Status: DC

## 2023-07-16 MED ORDER — ATORVASTATIN CALCIUM 40 MG PO TABS
40.0000 mg | ORAL_TABLET | Freq: Every day | ORAL | 3 refills | Status: DC
Start: 2023-07-16 — End: 2024-05-29

## 2023-07-16 NOTE — Telephone Encounter (Signed)
Please r/s lab/MD and notify pt

## 2023-07-31 ENCOUNTER — Inpatient Hospital Stay (HOSPITAL_BASED_OUTPATIENT_CLINIC_OR_DEPARTMENT_OTHER): Payer: Medicare Other | Admitting: Oncology

## 2023-07-31 ENCOUNTER — Encounter: Payer: Self-pay | Admitting: Oncology

## 2023-07-31 ENCOUNTER — Inpatient Hospital Stay: Payer: Medicare Other | Attending: Oncology

## 2023-07-31 VITALS — BP 162/81 | HR 53 | Temp 97.4°F | Resp 18 | Wt 257.0 lb

## 2023-07-31 DIAGNOSIS — Z87891 Personal history of nicotine dependence: Secondary | ICD-10-CM | POA: Insufficient documentation

## 2023-07-31 DIAGNOSIS — Z8546 Personal history of malignant neoplasm of prostate: Secondary | ICD-10-CM | POA: Insufficient documentation

## 2023-07-31 DIAGNOSIS — Z86718 Personal history of other venous thrombosis and embolism: Secondary | ICD-10-CM | POA: Insufficient documentation

## 2023-07-31 DIAGNOSIS — I87001 Postthrombotic syndrome without complications of right lower extremity: Secondary | ICD-10-CM | POA: Insufficient documentation

## 2023-07-31 DIAGNOSIS — Z79899 Other long term (current) drug therapy: Secondary | ICD-10-CM | POA: Insufficient documentation

## 2023-07-31 DIAGNOSIS — Z7901 Long term (current) use of anticoagulants: Secondary | ICD-10-CM | POA: Diagnosis not present

## 2023-07-31 LAB — CMP (CANCER CENTER ONLY)
ALT: 42 U/L (ref 0–44)
AST: 47 U/L — ABNORMAL HIGH (ref 15–41)
Albumin: 4.1 g/dL (ref 3.5–5.0)
Alkaline Phosphatase: 50 U/L (ref 38–126)
Anion gap: 11 (ref 5–15)
BUN: 24 mg/dL — ABNORMAL HIGH (ref 8–23)
CO2: 27 mmol/L (ref 22–32)
Calcium: 9.4 mg/dL (ref 8.9–10.3)
Chloride: 102 mmol/L (ref 98–111)
Creatinine: 1.3 mg/dL — ABNORMAL HIGH (ref 0.61–1.24)
GFR, Estimated: 56 mL/min — ABNORMAL LOW (ref >60)
Glucose, Bld: 110 mg/dL — ABNORMAL HIGH (ref 70–99)
Potassium: 3.8 mmol/L (ref 3.5–5.1)
Sodium: 140 mmol/L (ref 135–145)
Total Bilirubin: 0.7 mg/dL (ref <1.2)
Total Protein: 7.3 g/dL (ref 6.5–8.1)

## 2023-07-31 LAB — CBC (CANCER CENTER ONLY)
HCT: 41.5 % (ref 39.0–52.0)
Hemoglobin: 13.4 g/dL (ref 13.0–17.0)
MCH: 26.2 pg (ref 26.0–34.0)
MCHC: 32.3 g/dL (ref 30.0–36.0)
MCV: 81.1 fL (ref 80.0–100.0)
Platelet Count: 218 10*3/uL (ref 150–400)
RBC: 5.12 MIL/uL (ref 4.22–5.81)
RDW: 14.8 % (ref 11.5–15.5)
WBC Count: 5.5 10*3/uL (ref 4.0–10.5)
nRBC: 0 % (ref 0.0–0.2)

## 2023-07-31 MED ORDER — APIXABAN 2.5 MG PO TABS: 2.5000 mg | ORAL_TABLET | Freq: Two times a day (BID) | ORAL | 0 refills | Status: DC

## 2023-07-31 MED ORDER — APIXABAN 2.5 MG PO TABS: 2.5000 mg | ORAL_TABLET | Freq: Two times a day (BID) | ORAL | 1 refills | Status: DC

## 2023-07-31 NOTE — Assessment & Plan Note (Addendum)
Previous work up showed negative  prothrombin gene mutation, factor V Leiden mutation, Oct 2023  right lower extremity ultrasound negative.  resume Eliquis to 2.5mg  BID, long term maintenance.

## 2023-07-31 NOTE — Progress Notes (Signed)
Hematology/Oncology Progress note [phone] [phone]     CHIEF COMPLAINTS/REASON FOR VISIT:  right lower extremity DVT   ASSESSMENT & PLAN:   History of DVT (deep vein thrombosis) Previous work up showed negative  prothrombin gene mutation, factor V Leiden mutation, Oct 2023  right lower extremity ultrasound negative.  resume Eliquis to 2.5mg  BID, long term maintenance.   Post-thrombotic syndrome of right lower extremity Recommend leg elevation and compression stocking.  History of prostate cancer Recent PSA 0.01, stable.  Family history of sarcoma and colorectal cancer. He has no children and declined genetic testing.    Orders Placed This Encounter  Procedures   CBC with Differential (Cancer Center Only)    Standing Status:   Future    Standing Expiration Date:   07/30/2024   CMP (Cancer Center only)    Standing Status:   Future    Standing Expiration Date:   07/30/2024   Follow up in 6 months.  All questions were answered. The patient knows to call the clinic with any problems, questions or concerns.  [name], MD, PhD [facility] Hematology Oncology 07/31/2023      HISTORY OF PRESENTING ILLNESS:   Curtis Fansler. is a  82 y.o.  male with PMH listed below was seen in consultation at the request of  Doreene Nest, NP  for evaluation of right lower extremity DVT Patient has chronic back pain with intermittent right swelling pain.  He follows up with neurosurgeon.   03/02/2022, patient was seen by primary care provider.  He has noticed his chronic right lower extremity swelling worse.  Ultrasound was ordered for further evaluation.  Same day lower extremity ultrasound showed DVT of the posterior tibial vein,  Patient has history of prostate cancer, diagnosed in 2009 recent PSA is undetectable. Patient has been started on Eliquis and currently on Eliquis 5 mg twice daily.  He tolerates well.  Denies any bleeding events.  Right lower  extremity swelling is slightly better however not completely resolved.  Patient denies any family history of VTE or personal history  of previous VTE.  Denies any immobilization factors which may contribute to his DVT   INTERVAL HISTORY Curtis Black. is a 82 y.o. male who has above history reviewed by me today presents for follow up visit for right lower extremity DVT Patient has been off Eliquis 2.5mg  BID. He missed his appt in Oct 2024 due to having to take care of his sister who was undergoing treatment for sarcoma and colorectal cancer. He ran out of Eliquis and stopped taking.  He did not contact our office for refill.  Chronic right lower extremity edema, unchanged.  He denies chest pain, SOB, calf pain. No new complains.   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   CKD (chronic kidney disease) stage 3, GFR 30-59 ml/min (HCC)    Essential hypertension    Gross hematuria 07/29/2021   Hyperlipidemia    Prostate cancer (HCC) 2009   Psoriasis     SURGICAL HISTORY: Past Surgical History:  Procedure Laterality  Date   APPENDECTOMY     BOWEL RESECTION     CATARACT EXTRACTION W/PHACO Right 01/05/2021   Procedure: CATARACT EXTRACTION PHACO AND INTRAOCULAR LENS PLACEMENT (IOC) RIGHT OMIDRIA;  Surgeon: Lockie Mola, MD;  Location: [facility];  Service: Ophthalmology;  Laterality: Right;  cde 5.46 01:01.7 minutes 8.9%   CATARACT EXTRACTION W/PHACO Left 01/26/2021   Procedure: CATARACT EXTRACTION PHACO AND INTRAOCULAR LENS PLACEMENT (IOC) LEFT OMIDRIA 5.35 00:50.6;  Surgeon: Lockie Mola, MD;   Location: [facility];  Service: Ophthalmology;  Laterality: Left;   COLON SURGERY     COLONOSCOPY WITH PROPOFOL N/A 06/05/2017   Procedure: COLONOSCOPY WITH PROPOFOL;  Surgeon: Wyline Mood, MD;  Location: [facility] ENDOSCOPY;  Service: Gastroenterology;  Laterality: N/A;   COLONOSCOPY WITH PROPOFOL N/A 06/15/2020   Procedure: COLONOSCOPY WITH PROPOFOL;  Surgeon: Wyline Mood, MD;  Location: [facility] ENDOSCOPY;  Service: Gastroenterology;  Laterality: N/A;   COLONOSCOPY WITH PROPOFOL N/A 11/16/2020   Procedure: COLONOSCOPY WITH PROPOFOL;  Surgeon: Wyline Mood, MD;  Location: [facility] ENDOSCOPY;  Service: Gastroenterology;  Laterality: N/A;   KNEE ARTHROSCOPY Right    PROSTATECTOMY     thyroid biop      SOCIAL HISTORY: Social History   Socioeconomic History   Marital status: Married    Spouse name: Not on file   Number of children: Not on file   Years of education: Not on file   Highest education level: Doctorate  Occupational History   Not on file  Tobacco Use   Smoking status: Former    Current packs/day: 0.00    Average packs/day: 2.0 packs/day for 15.0 years (30.0 ttl pk-yrs)    Types: Cigarettes    Start date: 72    Quit date: 30    Years since quitting: 26.9   Smokeless tobacco: Never  Vaping Use   Vaping status: Never Used  Substance and Sexual Activity   Alcohol use: Yes    Alcohol/week: 7.0 standard drinks of alcohol    Types: 7 Glasses of wine per week    Comment: occasionally   Drug use: Not Currently   Sexual activity: Not Currently  Other Topics Concern   Not on file  Social History Narrative   Married.   No children.   Retired. Once worked for [employer].   Enjoys working on his house, playing golf, riding his bike.    Social Determinants of Health   Financial Resource Strain: Low Risk  (07/04/2023)   Overall Financial Resource Strain (CARDIA)    Difficulty of Paying Living Expenses: Not very hard  Food Insecurity: No Food Insecurity (07/04/2023)   Hunger  Vital Sign    Worried About Running Out of Food in the Last Year: Never true    Ran Out of Food in the Last Year: Never true  Transportation Needs: No Transportation Needs (07/04/2023)   PRAPARE - Administrator, Civil Service (Medical): No    Lack of Transportation (Non-Medical): No  Physical Activity: Sufficiently Active (07/04/2023)   Exercise Vital Sign    Days of Exercise per Week: 3 days    Minutes of Exercise per Session: 150+ min  Stress: No Stress Concern Present (07/04/2023)   [employer] of Occupational Health - Occupational Stress Questionnaire    Feeling of Stress : Only a little  Social Connections: Unknown (07/04/2023)   Social Connection and Isolation Panel [NHANES]    Frequency of Communication with Friends and Family: More than three times a week    Frequency of  Social Gatherings with Friends and Family: More than three times a week    Attends Religious Services: Patient declined    Database administrator or Organizations: Yes    Attends Engineer, structural: More than 4 times per year    Marital Status: Married  Recent Concern: Social Connections - Moderately Isolated (06/28/2023)   Social Connection and Isolation Panel [NHANES]    Frequency of Communication with Friends and Family: More than three times a week    Frequency of Social Gatherings with Friends and Family: Once a week    Attends Religious Services: Never    Database administrator or Organizations: No    Attends Banker Meetings: Never    Marital Status: Married  Catering manager Violence: Not At Risk (06/28/2023)   Humiliation, Afraid, Rape, and Kick questionnaire    Fear of Current or Ex-Partner: No    Emotionally Abused: No    Physically Abused: No    Sexually Abused: No    FAMILY HISTORY: Family History  Problem Relation Age of Onset   Emphysema Father    Diabetes Sister     ALLERGIES:  is allergic to lidocaine.  MEDICATIONS:  Current Outpatient  Medications  Medication Sig Dispense Refill   amLODipine (NORVASC) 10 MG tablet Take 1 tablet (10 mg total) by mouth daily. for blood pressure. 90 tablet 3   apixaban (ELIQUIS) 2.5 MG TABS tablet Take 1 tablet (2.5 mg total) by mouth 2 (two) times daily. 30 tablet 0   atorvastatin (LIPITOR) 40 MG tablet Take 1 tablet (40 mg total) by mouth daily. for cholesterol. 90 tablet 3   cetirizine (ZYRTEC) 10 MG tablet Take 10 mg by mouth daily.     Coenzyme Q10 (COQ10 PO) Take 1 capsule by mouth daily.      GLUCOSAMINE SULFATE PO Take by mouth.     losartan-hydrochlorothiazide (HYZAAR) 100-12.5 MG tablet TAKE 1 TABLET DAILY FOR    BLOOD PRESSURE 90 tablet 3   Multiple Vitamin (MULTIVITAMIN) tablet Take 1 tablet by mouth daily.     Omega-3 Fatty Acids (FISH OIL) 1000 MG CAPS Take 1,000 mg by mouth 2 (two) times daily.     sertraline (ZOLOFT) 50 MG tablet TAKE 1 TABLET (50 MG TOTAL) BY MOUTH DAILY. FOR ANXIETY. 90 tablet 0   apixaban (ELIQUIS) 2.5 MG TABS tablet Take 1 tablet (2.5 mg total) by mouth 2 (two) times daily. 180 tablet 1   No current facility-administered medications for this visit.    Review of Systems  Constitutional:  Negative for appetite change, chills, fatigue, fever and unexpected weight change.  HENT:   Negative for hearing loss and voice change.   Eyes:  Negative for eye problems and icterus.  Respiratory:  Negative for chest tightness, cough and shortness of breath.   Cardiovascular:  Positive for leg swelling. Negative for chest pain.  Gastrointestinal:  Negative for abdominal distention and abdominal pain.  Endocrine: Negative for hot flashes.  Genitourinary:  Negative for difficulty urinating, dysuria and frequency.   Musculoskeletal:  Negative for arthralgias.  Skin:  Negative for itching and rash.  Neurological:  Negative for light-headedness and numbness.  Hematological:  Negative for adenopathy. Does not bruise/bleed easily.  Psychiatric/Behavioral:  Negative for  confusion.    PHYSICAL EXAMINATION: ECOG PERFORMANCE STATUS: 0 - Asymptomatic Vitals:   07/31/23 1002  BP: (!) 162/81  Pulse: (!) 53  Resp: 18  Temp: (!) 97.4 F (36.3 C)   Filed Weights  07/31/23 1002  Weight: 257 lb (116.6 kg)    Physical Exam Constitutional:      General: He is not in acute distress. HENT:     Head: Normocephalic and atraumatic.  Eyes:     General: No scleral icterus. Cardiovascular:     Rate and Rhythm: Normal rate and regular rhythm.     Heart sounds: Normal heart sounds.  Pulmonary:     Effort: Pulmonary effort is normal. No respiratory distress.     Breath sounds: No wheezing.  Abdominal:     General: Bowel sounds are normal. There is no distension.     Palpations: Abdomen is soft.  Musculoskeletal:        General: No deformity. Normal range of motion.     Cervical back: Normal range of motion and neck supple.     Comments: Trace right lower extremity edema.   Skin:    General: Skin is warm and dry.     Findings: No erythema or rash.  Neurological:     Mental Status: He is alert and oriented to person, place, and time. Mental status is at baseline.     Cranial Nerves: No cranial nerve deficit.     Coordination: Coordination normal.  Psychiatric:        Mood and Affect: Mood normal.     LABORATORY DATA:  I have reviewed the data as listed    Latest Ref Rng & Units 07/31/2023    9:47 AM 12/15/2022    9:21 AM 03/29/2022   10:03 AM  CBC  WBC 4.0 - 10.5 K/uL 5.5  6.8  6.9   Hemoglobin 13.0 - 17.0 g/dL 50.9  32.6  71.2   Hematocrit 39.0 - 52.0 % 41.5  40.0  44.3   Platelets 150 - 400 K/uL 218  216  250       Latest Ref Rng & Units 07/31/2023    9:46 AM 07/11/2023    3:22 PM 12/15/2022    9:21 AM  CMP  Glucose 70 - 99 mg/dL 458  96  099   BUN 8 - 23 mg/dL 24  24  30    Creatinine 0.61 - 1.24 mg/dL 8.33  8.25  0.53   Sodium 135 - 145 mmol/L 140  141  138   Potassium 3.5 - 5.1 mmol/L 3.8  3.8  3.4   Chloride 98 - 111 mmol/L 102  103   104   CO2 22 - 32 mmol/L 27  29  25    Calcium 8.9 - 10.3 mg/dL 9.4  9.8  9.0   Total Protein 6.5 - 8.1 g/dL 7.3  7.6  7.6   Total Bilirubin <1.2 mg/dL 0.7  0.5  1.2   Alkaline Phos 38 - 126 U/L 50  65  51   AST 15 - 41 U/L 47  38  33   ALT 0 - 44 U/L 42  34  31       RADIOGRAPHIC STUDIES: I have personally reviewed the radiological images as listed and agreed with the findings in the report. DG Chest 2 View  Result Date: 07/28/2023 CLINICAL DATA:  Chronic cough.  Prior smoker. EXAM: CHEST - 2 VIEW COMPARISON:  None Available. FINDINGS: The heart is normal in size. Aortic atherosclerosis and tortuosity. Bronchial thickening with borderline hyperinflation. No focal airspace disease. No pulmonary nodule or mass demonstrated by radiograph. Normal pulmonary vasculature. No pneumothorax or pleural effusion. No acute osseous findings. Normal for age thoracic spondylosis. IMPRESSION: 1. Bronchial  thickening with borderline hyperinflation. No focal airspace disease. 2. Aortic Atherosclerosis (ICD10-I70.0). Electronically Signed   By: Narda Rutherford M.D.   On: 07/28/2023 13:23

## 2023-07-31 NOTE — Assessment & Plan Note (Addendum)
Recent PSA 0.01, stable.  Family history of sarcoma and colorectal cancer. He has no children and declined genetic testing.

## 2023-07-31 NOTE — Assessment & Plan Note (Signed)
Recommend leg elevation and compression stocking. 

## 2023-07-31 NOTE — Progress Notes (Signed)
Pt here for follow up. Pt reports that he stopped taking Eliquis a few months ago.

## 2023-09-13 DIAGNOSIS — Z961 Presence of intraocular lens: Secondary | ICD-10-CM | POA: Diagnosis not present

## 2023-09-13 DIAGNOSIS — H43813 Vitreous degeneration, bilateral: Secondary | ICD-10-CM | POA: Diagnosis not present

## 2023-09-21 ENCOUNTER — Other Ambulatory Visit: Payer: Self-pay | Admitting: Primary Care

## 2023-09-21 DIAGNOSIS — R7303 Prediabetes: Secondary | ICD-10-CM

## 2023-09-26 ENCOUNTER — Other Ambulatory Visit: Payer: Self-pay | Admitting: Primary Care

## 2023-09-26 DIAGNOSIS — F411 Generalized anxiety disorder: Secondary | ICD-10-CM

## 2023-10-11 ENCOUNTER — Other Ambulatory Visit (INDEPENDENT_AMBULATORY_CARE_PROVIDER_SITE_OTHER): Payer: Medicare Other

## 2023-10-11 DIAGNOSIS — R7303 Prediabetes: Secondary | ICD-10-CM

## 2023-10-11 LAB — POCT GLYCOSYLATED HEMOGLOBIN (HGB A1C): Hemoglobin A1C: 5.8 % — AB (ref 4.0–5.6)

## 2023-10-30 DIAGNOSIS — D126 Benign neoplasm of colon, unspecified: Secondary | ICD-10-CM | POA: Diagnosis not present

## 2023-10-30 DIAGNOSIS — Z7901 Long term (current) use of anticoagulants: Secondary | ICD-10-CM | POA: Diagnosis not present

## 2023-10-30 DIAGNOSIS — K5730 Diverticulosis of large intestine without perforation or abscess without bleeding: Secondary | ICD-10-CM | POA: Diagnosis not present

## 2023-10-30 DIAGNOSIS — K219 Gastro-esophageal reflux disease without esophagitis: Secondary | ICD-10-CM | POA: Diagnosis not present

## 2023-10-30 DIAGNOSIS — Z800 Family history of malignant neoplasm of digestive organs: Secondary | ICD-10-CM | POA: Diagnosis not present

## 2023-11-23 ENCOUNTER — Other Ambulatory Visit: Payer: Self-pay | Admitting: Dermatology

## 2023-12-13 ENCOUNTER — Encounter: Payer: Self-pay | Admitting: Gastroenterology

## 2023-12-27 ENCOUNTER — Ambulatory Visit: Admitting: Anesthesiology

## 2023-12-27 ENCOUNTER — Other Ambulatory Visit: Payer: Self-pay

## 2023-12-27 ENCOUNTER — Ambulatory Visit
Admission: RE | Admit: 2023-12-27 | Discharge: 2023-12-27 | Disposition: A | Discharge status: Home / Self Care | Attending: Gastroenterology | Admitting: Gastroenterology

## 2023-12-27 ENCOUNTER — Encounter
Admission: RE | Disposition: A | Discharge status: Home / Self Care | Payer: Self-pay | Source: Home / Self Care | Attending: Gastroenterology

## 2023-12-27 ENCOUNTER — Encounter: Payer: Self-pay | Admitting: Gastroenterology

## 2023-12-27 DIAGNOSIS — K635 Polyp of colon: Secondary | ICD-10-CM | POA: Diagnosis not present

## 2023-12-27 DIAGNOSIS — N183 Chronic kidney disease, stage 3 unspecified: Secondary | ICD-10-CM | POA: Diagnosis not present

## 2023-12-27 DIAGNOSIS — Z9079 Acquired absence of other genital organ(s): Secondary | ICD-10-CM | POA: Insufficient documentation

## 2023-12-27 DIAGNOSIS — K640 First degree hemorrhoids: Secondary | ICD-10-CM | POA: Diagnosis not present

## 2023-12-27 DIAGNOSIS — D123 Benign neoplasm of transverse colon: Secondary | ICD-10-CM | POA: Diagnosis not present

## 2023-12-27 DIAGNOSIS — I129 Hypertensive chronic kidney disease with stage 1 through stage 4 chronic kidney disease, or unspecified chronic kidney disease: Secondary | ICD-10-CM | POA: Insufficient documentation

## 2023-12-27 DIAGNOSIS — Z7901 Long term (current) use of anticoagulants: Secondary | ICD-10-CM | POA: Insufficient documentation

## 2023-12-27 DIAGNOSIS — K5730 Diverticulosis of large intestine without perforation or abscess without bleeding: Secondary | ICD-10-CM | POA: Insufficient documentation

## 2023-12-27 DIAGNOSIS — D122 Benign neoplasm of ascending colon: Secondary | ICD-10-CM | POA: Insufficient documentation

## 2023-12-27 DIAGNOSIS — K649 Unspecified hemorrhoids: Secondary | ICD-10-CM | POA: Diagnosis not present

## 2023-12-27 DIAGNOSIS — Z800 Family history of malignant neoplasm of digestive organs: Secondary | ICD-10-CM | POA: Diagnosis not present

## 2023-12-27 DIAGNOSIS — Z8546 Personal history of malignant neoplasm of prostate: Secondary | ICD-10-CM | POA: Diagnosis not present

## 2023-12-27 DIAGNOSIS — D128 Benign neoplasm of rectum: Secondary | ICD-10-CM | POA: Diagnosis not present

## 2023-12-27 DIAGNOSIS — K621 Rectal polyp: Secondary | ICD-10-CM | POA: Diagnosis not present

## 2023-12-27 DIAGNOSIS — Z1211 Encounter for screening for malignant neoplasm of colon: Secondary | ICD-10-CM | POA: Insufficient documentation

## 2023-12-27 DIAGNOSIS — Z860101 Personal history of adenomatous and serrated colon polyps: Secondary | ICD-10-CM | POA: Diagnosis not present

## 2023-12-27 HISTORY — PX: POLYPECTOMY: SHX149

## 2023-12-27 HISTORY — DX: Neoplasm of uncertain behavior of other specified digestive organs: D37.8

## 2023-12-27 HISTORY — DX: Neoplasm of uncertain behavior of rectum: D37.5

## 2023-12-27 HISTORY — PX: COLONOSCOPY: SHX5424

## 2023-12-27 HISTORY — DX: Neoplasm of uncertain behavior of other specified digestive organs: D37.1

## 2023-12-27 SURGERY — COLONOSCOPY
Anesthesia: General

## 2023-12-27 MED ORDER — EPHEDRINE 5 MG/ML INJ
INTRAVENOUS | Status: AC
  Filled 2023-12-27: qty 5

## 2023-12-27 MED ORDER — SODIUM CHLORIDE 0.9 % IV SOLN: INTRAVENOUS | Status: DC

## 2023-12-27 MED ORDER — GLYCOPYRROLATE 0.2 MG/ML IJ SOLN
INTRAMUSCULAR | Status: DC | PRN
Start: 2023-12-27 — End: 2023-12-27
  Administered 2023-12-27: .2 mg via INTRAVENOUS

## 2023-12-27 MED ORDER — LIDOCAINE HCL (CARDIAC) PF 100 MG/5ML IV SOSY
PREFILLED_SYRINGE | INTRAVENOUS | Status: DC | PRN
  Administered 2023-12-27: 60 mg via INTRAVENOUS

## 2023-12-27 MED ORDER — LIDOCAINE HCL (PF) 2 % IJ SOLN
INTRAMUSCULAR | Status: AC
  Filled 2023-12-27: qty 5

## 2023-12-27 MED ORDER — PROPOFOL 500 MG/50ML IV EMUL
INTRAVENOUS | Status: DC | PRN
  Administered 2023-12-27: 100 ug/kg/min via INTRAVENOUS

## 2023-12-27 MED ORDER — GLYCOPYRROLATE 0.2 MG/ML IJ SOLN
INTRAMUSCULAR | Status: AC
  Filled 2023-12-27: qty 1

## 2023-12-27 MED ORDER — EPHEDRINE SULFATE-NACL 50-0.9 MG/10ML-% IV SOSY
PREFILLED_SYRINGE | INTRAVENOUS | Status: DC | PRN
  Administered 2023-12-27 (×2): 5 mg via INTRAVENOUS
  Administered 2023-12-27: 10 mg via INTRAVENOUS
  Administered 2023-12-27: 5 mg via INTRAVENOUS

## 2023-12-27 MED ORDER — PROPOFOL 10 MG/ML IV BOLUS
INTRAVENOUS | Status: DC | PRN
  Administered 2023-12-27: 50 mg via INTRAVENOUS

## 2023-12-27 NOTE — Interval H&P Note (Signed)
 History and Physical Interval Note: Preprocedure H&P from 12/27/23  was reviewed and there was no interval change after seeing and examining the patient.  Written consent was obtained from the patient after discussion of risks, benefits, and alternatives. Patient has consented to proceed with Colonoscopy with possible intervention   12/27/2023 10:23 AM  Curtis Black.  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  The various methods of treatment have been discussed with the patient and family. After consideration of risks, benefits and other options for treatment, the patient has consented to  Procedure(s): COLONOSCOPY (N/A) as a surgical intervention.  The patient's history has been reviewed, patient examined, no change in status, stable for surgery.  I have reviewed the patient's chart and labs.  Questions were answered to the patient's satisfaction.     Quintin Buckle

## 2023-12-27 NOTE — Op Note (Signed)
 [facility] Gastroenterology Patient Name: Curtis Black Procedure Date: 12/27/2023 10:15 AM MRN: [id number] Account #: [id number] Date of Birth: 08/20/1941 Admit Type: Outpatient Age: 83 Room: [facility] ENDO ROOM 1 Gender: Male Note Status: Finalized Instrument Name: Colonoscope [id number] Procedure:             Colonoscopy Indications:           High risk colon cancer surveillance: Personal history                         of colonic polyps Providers:             Bridgett Camps, DO Referring MD:          Gabriel John (Referring MD) Medicines:             Monitored Anesthesia Care Complications:         No immediate complications. Estimated blood loss:                         Minimal. Procedure:             Pre-Anesthesia Assessment:                        - Prior to the procedure, a History and Physical was                         performed, and patient medications and allergies were                         reviewed. The patient is competent. The risks and                         benefits of the procedure and the sedation options and                         risks were discussed with the patient. All questions                         were answered and informed consent was obtained.                         Patient identification and proposed procedure were                         verified by the physician, the nurse, the anesthetist                         and the technician in the endoscopy suite. Mental                         Status Examination: alert and oriented. Airway                         Examination: normal oropharyngeal airway and neck                         mobility. Respiratory Examination: clear to  auscultation. CV Examination: RRR, no murmurs, no S3                         or S4. Prophylactic Antibiotics: The patient does not                         require prophylactic antibiotics. Prior                          Anticoagulants: The patient has taken Eliquis                          (apixaban ), last dose was 3 days prior to procedure.                         ASA Grade Assessment: II - A patient with mild                         systemic disease. After reviewing the risks and                         benefits, the patient was deemed in satisfactory                         condition to undergo the procedure. The anesthesia                         plan was to use monitored anesthesia care (MAC).                         Immediately prior to administration of medications,                         the patient was re-assessed for adequacy to receive                         sedatives. The heart rate, respiratory rate, oxygen                         saturations, blood pressure, adequacy of pulmonary                         ventilation, and response to care were monitored                         throughout the procedure. The physical status of the                         patient was re-assessed after the procedure.                        After obtaining informed consent, the colonoscope was                         passed under direct vision. Throughout the procedure,                         the patient's blood pressure, pulse, and oxygen  saturations were monitored continuously. The                         Colonoscope was introduced through the anus and                         advanced to the the terminal ileum, with                         identification of the appendiceal orifice and IC                         valve. The colonoscopy was performed without                         difficulty. The patient tolerated the procedure well.                         The quality of the bowel preparation was good. The                         terminal ileum, ileocecal valve, appendiceal orifice,                         and rectum were photographed. Findings:      The perianal and digital rectal  examinations were normal. Pertinent       negatives include normal sphincter tone.      The terminal ileum appeared normal. Estimated blood loss: none.      Multiple small-mouthed diverticula were found in the entire colon.       Estimated blood loss: none.      Non-bleeding internal hemorrhoids were found during retroflexion. The       hemorrhoids were Grade I (internal hemorrhoids that do not prolapse).       Estimated blood loss: none.      A 2 to 3 mm polyp was found in the rectum. The polyp was sessile. The       polyp was removed with a jumbo cold forceps. Resection and retrieval       were complete. Estimated blood loss was minimal. adjacent to       diverticula. Removed with 2 forcep bites. Estimated blood loss was       minimal.      Five sessile polyps were found in the transverse colon (1) and ascending       colon (4). The polyps were 3 to 6 mm in size. These polyps were removed       with a cold snare. Resection and retrieval were complete. Estimated       blood loss was minimal.      The exam was otherwise without abnormality on direct and retroflexion       views. Impression:            - The examined portion of the ileum was normal.                        - Diverticulosis in the entire examined colon.                        - Non-bleeding internal hemorrhoids.                        -  One 2 to 3 mm polyp in the rectum, removed with a                         jumbo cold forceps. Resected and retrieved.                        - Five 3 to 6 mm polyps in the transverse colon and in                         the ascending colon, removed with a cold snare.                         Resected and retrieved.                        - The examination was otherwise normal on direct and                         retroflexion views. Recommendation:        - Patient has a contact number available for                         emergencies. The signs and symptoms of potential                          delayed complications were discussed with the patient.                         Return to normal activities tomorrow. Written                         discharge instructions were provided to the patient.                        - Discharge patient to home.                        - Resume previous diet.                        - Continue present medications.                        - No ibuprofen, naproxen, or other non-steroidal                         anti-inflammatory drugs for 5 days after polyp removal.                        - Resume Eliquis  (apixaban ) at prior dose in 2 days.                         Refer to managing physician for further adjustment of                         therapy.                        - Await pathology results.                        -  Repeat colonoscopy for surveillance based on                         pathology results.                        - Return to referring physician as previously                         scheduled.                        - The findings and recommendations were discussed with                         the patient. Procedure Code(s):     --- Professional ---                        [phone], Colonoscopy, flexible; with removal of                         tumor(s), polyp(s), or other lesion(s) by snare                         technique                        45380, 59, Colonoscopy, flexible; with biopsy, single                         or multiple Diagnosis Code(s):     --- Professional ---                        Z86.010, Personal history of colonic polyps                        K64.0, First degree hemorrhoids                        D12.8, Benign neoplasm of rectum                        D12.3, Benign neoplasm of transverse colon (hepatic                         flexure or splenic flexure)                        D12.2, Benign neoplasm of ascending colon                        K57.30, Diverticulosis of large intestine without                          perforation or abscess without bleeding CPT copyright 2022 American Medical Association. All rights reserved. The codes documented in this report are preliminary and upon coder review may  be revised to meet current compliance requirements. Attending Participation:      I personally performed the entire procedure. Polo Brisk, DO Quintin Buckle DO, DO 12/27/2023 11:07:48 AM This report has been signed electronically. Number of Addenda: 0 Note Initiated On:  12/27/2023 10:15 AM Scope Withdrawal Time: 0 hours 15 minutes 8 seconds  Total Procedure Duration: 0 hours 23 minutes 24 seconds  Estimated Blood Loss:  Estimated blood loss was minimal.      [facility]

## 2023-12-27 NOTE — Anesthesia Postprocedure Evaluation (Signed)
 Anesthesia Post Note  Patient: Carrington Sparta.  Procedure(s) Performed: COLONOSCOPY POLYPECTOMY, INTESTINE  Patient location during evaluation: Endoscopy Anesthesia Type: General Level of consciousness: awake and alert Pain management: pain level controlled Vital Signs Assessment: post-procedure vital signs reviewed and stable Respiratory status: spontaneous breathing, nonlabored ventilation, respiratory function stable and patient connected to nasal cannula oxygen Cardiovascular status: blood pressure returned to baseline and stable Postop Assessment: no apparent nausea or vomiting Anesthetic complications: no   No notable events documented.   Last Vitals:  Vitals:   12/27/23 1101 12/27/23 1105  BP: (!) 86/50 (!) 89/45  Pulse:  (!) 56  Resp: 13 13  Temp: (!) 36.1 C   SpO2:  92%    Last Pain:  Vitals:   12/27/23 1111  TempSrc:   PainSc: 0-No pain                 Portia Brittle Soraida Vickers

## 2023-12-27 NOTE — H&P (Signed)
 Pre-Procedure H&P   Patient ID: Curtis Black. is a 83 y.o. male.  Gastroenterology Provider: Quintin Buckle, DO  PCP: Gabriel John, NP  Date: 12/27/2023  HPI [name]. is a 83 y.o. male who presents today for Colonoscopy for Personal history of colon polyps .  Patient last underwent colonoscopy in March 2022 with 3 tubular adenomas and 1 sessile serrated polyp.  Left-sided diverticulosis noted as well.  Has a history of a large cecal polyp that underwent surgical resection.  Colonoscopy in October 2021 with 2 tubular adenomas and poor prep.  October 2018 5 tubular adenomas and 1 sessile serrated polyp as well as a lipoma.  He is 15 tubular adenomas and sessile serrated polyps lifetime  No current lower GI symptoms  Prostate cancer status post prostatectomy. 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    Neoplasm of uncertain behavior of stomach, intestines, and rectum    Prostate cancer (HCC) 2009   Psoriasis     Past Surgical History:  Procedure Laterality Date   APPENDECTOMY     BOWEL RESECTION     CATARACT EXTRACTION W/PHACO Right 01/05/2021   Procedure: CATARACT EXTRACTION PHACO AND INTRAOCULAR LENS PLACEMENT (IOC) RIGHT OMIDRIA ;  Surgeon: Annell Kidney, MD;  Location: [facility];  Service: Ophthalmology;  Laterality: Right;  cde 5.46 01:01.7 minutes 8.9%   CATARACT EXTRACTION W/PHACO Left 01/26/2021   Procedure: CATARACT EXTRACTION PHACO AND INTRAOCULAR LENS PLACEMENT (IOC) LEFT OMIDRIA  5.35 00:50.6;  Surgeon: Annell Kidney, MD;  Location: [facility];  Service: Ophthalmology;  Laterality: Left;   COLON SURGERY     COLONOSCOPY WITH PROPOFOL  N/A 06/05/2017   Procedure: COLONOSCOPY WITH PROPOFOL ;  Surgeon: Luke Salaam, MD;  Location: [facility] ENDOSCOPY;  Service: Gastroenterology;  Laterality: N/A;   COLONOSCOPY WITH PROPOFOL  N/A 06/15/2020   Procedure: COLONOSCOPY WITH PROPOFOL ;  Surgeon: Luke Salaam, MD;  Location: [facility] ENDOSCOPY;  Service: Gastroenterology;  Laterality: N/A;   COLONOSCOPY WITH PROPOFOL  N/A 11/16/2020   Procedure: COLONOSCOPY WITH PROPOFOL ;  Surgeon: Luke Salaam, MD;  Location: [facility] ENDOSCOPY;  Service: Gastroenterology;  Laterality: N/A;   EYE SURGERY     KNEE ARTHROSCOPY Right    PROSTATECTOMY     thyroid  biop      Family History Sister with family history of rectal cancer-invasive high-grade squamous cell at age 79 No other h/o GI disease or malignancy  Review of Systems  Constitutional:  Negative for activity change, appetite change, chills, diaphoresis, fatigue, fever and unexpected weight change.  HENT:  Negative for trouble swallowing and voice change.   Respiratory:  Negative for shortness of breath and wheezing.   Cardiovascular:  Negative for chest pain, palpitations and leg swelling.  Gastrointestinal:  Negative for abdominal distention, abdominal pain, anal bleeding, blood in stool, constipation, diarrhea, nausea and vomiting.  Musculoskeletal:  Negative for arthralgias and myalgias.  Skin:  Negative for color change and pallor.  Neurological:  Negative for dizziness, syncope and weakness.  Psychiatric/Behavioral:  Negative for confusion. The patient is not nervous/anxious.    All other systems reviewed and are negative.    Medications No current facility-administered medications on file prior to encounter.   Current Outpatient Medications on File Prior to Encounter  Medication Sig Dispense Refill   amLODipine  (NORVASC ) 10 MG tablet Take 1 tablet (10 mg total) by mouth daily. for blood pressure. 90 tablet 3   atorvastatin  (LIPITOR) 40 MG tablet Take 1 tablet (40 mg total) by mouth daily. for cholesterol. 90 tablet 3   cetirizine (ZYRTEC) 10 MG tablet Take 10 mg by mouth daily.     Coenzyme Q10 (COQ10 PO) Take 1 capsule by mouth daily.      GLUCOSAMINE SULFATE PO Take by mouth.     losartan -hydrochlorothiazide  (HYZAAR) 100-12.5 MG tablet TAKE 1 TABLET DAILY FOR    BLOOD PRESSURE 90 tablet 3   Multiple Vitamin (MULTIVITAMIN) tablet Take 1 tablet by mouth daily.     Omega-3 Fatty Acids (FISH OIL) 1000 MG CAPS Take 1,000 mg by mouth 2 (two) times daily.     sertraline  (ZOLOFT ) 50 MG tablet TAKE 1 TABLET (50 MG TOTAL) BY MOUTH DAILY. FOR ANXIETY. 90 tablet 2   apixaban  (ELIQUIS ) 2.5 MG TABS tablet Take 1 tablet (2.5 mg total) by mouth 2 (two) times daily. 180 tablet 1   apixaban  (ELIQUIS ) 2.5 MG TABS tablet Take 1 tablet (2.5 mg total) by mouth 2 (two) times daily. 30 tablet 0    Pertinent medications related to GI and procedure were reviewed by me with the patient prior to the procedure   Current Facility-Administered Medications:    0.9 %  sodium chloride  infusion, , Intravenous, Continuous, Quintin Buckle, DO, Last Rate: 20 mL/hr at 12/27/23 1019, New Bag at 12/27/23 1019  sodium chloride  20 mL/hr at 12/27/23 1019       Allergies  Allergen Reactions   Lidocaine      Cut off wind   Allergies were reviewed by me prior to the procedure  Objective   Body mass index is 31.87 kg/m. Vitals:   12/27/23 1017  BP: (!) 151/90  Pulse: 60  Resp: 16  Temp: (!) 97.1 F (36.2 C)  TempSrc: Temporal  SpO2: 96%  Weight: 112.6 kg  Height: 6\' 2"  (1.88  m)     Physical Exam Vitals and nursing note reviewed.  Constitutional:      General: He is not in acute distress.    Appearance: Normal appearance. He is not ill-appearing, toxic-appearing or diaphoretic.  HENT:     Head: Normocephalic and atraumatic.     Nose: Nose normal.     Mouth/Throat:     Mouth: Mucous membranes are moist.     Pharynx: Oropharynx is clear.  Eyes:     General: No scleral icterus.    Extraocular Movements: Extraocular movements intact.  Cardiovascular:     Rate and Rhythm: Regular rhythm. Bradycardia present.     Heart sounds: Normal heart sounds. No murmur heard.    No friction rub. No gallop.  Pulmonary:     Effort: Pulmonary effort is normal. No respiratory distress.     Breath sounds: Normal breath sounds. No wheezing, rhonchi or rales.  Abdominal:  General: Bowel sounds are normal. There is no distension.     Palpations: Abdomen is soft.     Tenderness: There is no abdominal tenderness. There is no guarding or rebound.  Musculoskeletal:     Cervical back: Neck supple.     Right lower leg: No edema.     Left lower leg: No edema.  Skin:    General: Skin is warm and dry.     Coloration: Skin is not jaundiced or pale.  Neurological:     General: No focal deficit present.     Mental Status: He is alert and oriented to person, place, and time. Mental status is at baseline.  Psychiatric:        Mood and Affect: Mood normal.        Behavior: Behavior normal.        Thought Content: Thought content normal.        Judgment: Judgment normal.      Assessment:  Mr. Taelyn Aman. is a 83 y.o. male  who presents today for Colonoscopy for personal history of colon polyps.  Plan:  Colonoscopy with possible intervention today  Colonoscopy with possible biopsy, control of bleeding, polypectomy, and interventions as necessary has been discussed with the patient/patient representative. Informed consent was obtained from the patient/patient  representative after explaining the indication, nature, and risks of the procedure including but not limited to death, bleeding, perforation, missed neoplasm/lesions, cardiorespiratory compromise, and reaction to medications. Opportunity for questions was given and appropriate answers were provided. Patient/patient representative has verbalized understanding is amenable to undergoing the procedure.   Quintin Buckle, DO  [facility]  Portions of the record may have been created with voice recognition software. Occasional wrong-word or 'sound-a-like' substitutions may have occurred due to the inherent limitations of voice recognition software.  Read the chart carefully and recognize, using context, where substitutions may have occurred.

## 2023-12-27 NOTE — Transfer of Care (Signed)
 Immediate Anesthesia Transfer of Care Note  Patient: Curtis Black.  Procedure(s) Performed: COLONOSCOPY POLYPECTOMY, INTESTINE  Patient Location: PACU and Endoscopy Unit  Anesthesia Type:General  Level of Consciousness: sedated  Airway & Oxygen Therapy: Patient Spontanous Breathing  Post-op Assessment: Report given to RN and Post -op Vital signs reviewed and stable  Post vital signs: Reviewed and stable  Last Vitals:  Vitals Value Taken Time  BP 89/45 12/27/23 1105  Temp 36.1 C 12/27/23 1101  Pulse 56 12/27/23 1106  Resp 14 12/27/23 1106  SpO2 91 % 12/27/23 1106  Vitals shown include unfiled device data.  Last Pain:  Vitals:   12/27/23 1101  TempSrc: Temporal  PainSc: Asleep         Complications: No notable events documented.

## 2023-12-27 NOTE — Anesthesia Preprocedure Evaluation (Signed)
 Anesthesia Evaluation  Patient identified by MRN, date of birth, ID band Patient awake    Reviewed: Allergy & Precautions, NPO status , Patient's Chart, lab work & pertinent test results  History of Anesthesia Complications Negative for: history of anesthetic complications  Airway Mallampati: III  TM Distance: <3 FB Neck ROM: full    Dental  (+) Chipped   Pulmonary neg shortness of breath, former smoker   Pulmonary exam normal        Cardiovascular Exercise Tolerance: Good hypertension, (-) angina (-) Past MI Normal cardiovascular exam     Neuro/Psych  PSYCHIATRIC DISORDERS      negative neurological ROS     GI/Hepatic negative GI ROS, Neg liver ROS,neg GERD  ,,  Endo/Other  negative endocrine ROS    Renal/GU Renal disease  negative genitourinary   Musculoskeletal   Abdominal   Peds  Hematology negative hematology ROS (+)   Anesthesia Other Findings Past Medical History: No date: Actinic keratosis No date: Anxiety 03/06/2017: Basal cell carcinoma     Comment:  left medial infraorbital lat nose inf to med canthus 06/12/2019: Basal cell carcinoma     Comment:  sup to glabella 12/15/2021: Basal cell carcinoma     Comment:  superior to glabella,  Mohs 03/28/2022 06/12/2019: Basal cell carcinoma (BCC) of nostril     Comment:  Removed, no other intervention needed per pt. 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 No date: Neoplasm of uncertain behavior of stomach, intestines, and  rectum 2009: Prostate cancer (HCC) No date: Psoriasis  Past Surgical History: No date: APPENDECTOMY No date: BOWEL RESECTION 01/05/2021: CATARACT EXTRACTION W/PHACO; Right     Comment:  Procedure: CATARACT EXTRACTION PHACO AND INTRAOCULAR               LENS  PLACEMENT (IOC) RIGHT OMIDRIA ;  Surgeon: Annell Kidney, MD;  Location: [facility];  Service:               Ophthalmology;  Laterality: Right;  cde 5.46 01:01.7               minutes 8.9% 01/26/2021: CATARACT EXTRACTION W/PHACO; Left     Comment:  Procedure: CATARACT EXTRACTION PHACO AND INTRAOCULAR               LENS PLACEMENT (IOC) LEFT OMIDRIA  5.35 00:50.6;  Surgeon:              Annell Kidney, MD;  Location: [facility];              Service: Ophthalmology;  Laterality: Left; No date: COLON SURGERY 06/05/2017: COLONOSCOPY WITH PROPOFOL ; N/A     Comment:  Procedure: COLONOSCOPY WITH PROPOFOL ;  Surgeon: Luke Salaam, MD;  Location: [facility] ENDOSCOPY;  Service:               Gastroenterology;  Laterality: N/A; 06/15/2020: COLONOSCOPY WITH PROPOFOL ; N/A     Comment:  Procedure: COLONOSCOPY WITH PROPOFOL ;  Surgeon: Luke Salaam, MD;  Location: [facility] ENDOSCOPY;  Service:               Gastroenterology;  Laterality: N/A; 11/16/2020: COLONOSCOPY  WITH PROPOFOL ; N/A     Comment:  Procedure: COLONOSCOPY WITH PROPOFOL ;  Surgeon: Luke Salaam, MD;  Location: [facility]COPY;  Service:               Gastroenterology;  Laterality: N/A; No date: EYE SURGERY No date: KNEE ARTHROSCOPY; Right No date: PROSTATECTOMY No date: thyroid  biop     Reproductive/Obstetrics negative OB ROS                             Anesthesia Physical Anesthesia Plan  ASA: 2  Anesthesia Plan: General   Post-op Pain Management:    Induction: Intravenous  PONV Risk Score and Plan: Propofol  infusion and TIVA  Airway Management Planned: Natural Airway and Nasal Cannula  Additional Equipment:   Intra-op Plan:   Post-operative Plan:   Informed Consent: I have reviewed the patients History and Physical, chart, labs and discussed the procedure including the risks, benefits and alternatives for the proposed  anesthesia with the patient or authorized representative who has indicated his/her understanding and acceptance.     Dental Advisory Given  Plan Discussed with: Anesthesiologist, CRNA and Surgeon  Anesthesia Plan Comments: (Patient consented for risks of anesthesia including but not limited to:  - adverse reactions to medications - risk of airway placement if required - damage to eyes, teeth, lips or other oral mucosa - nerve damage due to positioning  - sore throat or hoarseness - Damage to heart, brain, nerves, lungs, other parts of body or loss of life  Patient voiced understanding and assent.)       Anesthesia Quick Evaluation

## 2023-12-28 LAB — SURGICAL PATHOLOGY

## 2024-01-31 ENCOUNTER — Encounter: Payer: Self-pay | Admitting: Oncology

## 2024-01-31 ENCOUNTER — Inpatient Hospital Stay (HOSPITAL_BASED_OUTPATIENT_CLINIC_OR_DEPARTMENT_OTHER): Payer: Medicare Other | Admitting: Oncology

## 2024-01-31 ENCOUNTER — Inpatient Hospital Stay: Payer: Medicare Other | Attending: Oncology

## 2024-01-31 VITALS — BP 159/74 | HR 51 | Temp 96.7°F | Resp 18 | Wt 253.3 lb

## 2024-01-31 DIAGNOSIS — D509 Iron deficiency anemia, unspecified: Secondary | ICD-10-CM | POA: Insufficient documentation

## 2024-01-31 DIAGNOSIS — Z87891 Personal history of nicotine dependence: Secondary | ICD-10-CM | POA: Insufficient documentation

## 2024-01-31 DIAGNOSIS — Z8546 Personal history of malignant neoplasm of prostate: Secondary | ICD-10-CM | POA: Diagnosis not present

## 2024-01-31 DIAGNOSIS — I87001 Postthrombotic syndrome without complications of right lower extremity: Secondary | ICD-10-CM

## 2024-01-31 DIAGNOSIS — Z86718 Personal history of other venous thrombosis and embolism: Secondary | ICD-10-CM

## 2024-01-31 LAB — CMP (CANCER CENTER ONLY)
ALT: 23 U/L (ref 0–44)
AST: 29 U/L (ref 15–41)
Albumin: 4.2 g/dL (ref 3.5–5.0)
Alkaline Phosphatase: 58 U/L (ref 38–126)
Anion gap: 10 (ref 5–15)
BUN: 24 mg/dL — ABNORMAL HIGH (ref 8–23)
CO2: 25 mmol/L (ref 22–32)
Calcium: 9.2 mg/dL (ref 8.9–10.3)
Chloride: 106 mmol/L (ref 98–111)
Creatinine: 1.41 mg/dL — ABNORMAL HIGH (ref 0.61–1.24)
GFR, Estimated: 50 mL/min — ABNORMAL LOW (ref >60)
Glucose, Bld: 95 mg/dL (ref 70–99)
Potassium: 3.6 mmol/L (ref 3.5–5.1)
Sodium: 141 mmol/L (ref 135–145)
Total Bilirubin: 0.8 mg/dL (ref 0.0–1.2)
Total Protein: 7.5 g/dL (ref 6.5–8.1)

## 2024-01-31 LAB — CBC WITH DIFFERENTIAL (CANCER CENTER ONLY)
Abs Immature Granulocytes: 0.02 10*3/uL (ref 0.00–0.07)
Basophils Absolute: 0.1 10*3/uL (ref 0.0–0.1)
Basophils Relative: 1 %
Eosinophils Absolute: 0.1 10*3/uL (ref 0.0–0.5)
Eosinophils Relative: 2 %
HCT: 38.1 % — ABNORMAL LOW (ref 39.0–52.0)
Hemoglobin: 12 g/dL — ABNORMAL LOW (ref 13.0–17.0)
Immature Granulocytes: 0 %
Lymphocytes Relative: 32 %
Lymphs Abs: 2.1 10*3/uL (ref 0.7–4.0)
MCH: 24.8 pg — ABNORMAL LOW (ref 26.0–34.0)
MCHC: 31.5 g/dL (ref 30.0–36.0)
MCV: 78.7 fL — ABNORMAL LOW (ref 80.0–100.0)
Monocytes Absolute: 0.8 10*3/uL (ref 0.1–1.0)
Monocytes Relative: 12 %
Neutro Abs: 3.4 10*3/uL (ref 1.7–7.7)
Neutrophils Relative %: 53 %
Platelet Count: 246 10*3/uL (ref 150–400)
RBC: 4.84 MIL/uL (ref 4.22–5.81)
RDW: 14.2 % (ref 11.5–15.5)
WBC Count: 6.5 10*3/uL (ref 4.0–10.5)
nRBC: 0 % (ref 0.0–0.2)

## 2024-01-31 MED ORDER — IRON-VITAMIN C 65-125 MG PO TABS: 1.0000 | ORAL_TABLET | Freq: Every day | ORAL | Status: AC

## 2024-01-31 MED ORDER — APIXABAN 2.5 MG PO TABS: 2.5000 mg | ORAL_TABLET | Freq: Two times a day (BID) | ORAL | 1 refills | Status: DC

## 2024-01-31 NOTE — Assessment & Plan Note (Signed)
 Likely due to bleeding after recent colonoscopy polyp removal.  Recommend empiric Vitron C 1 tab daily for 1-2 months - OTC.  He may hole off Eliquis  2.5mg  BID for 3 days to allow healing.

## 2024-01-31 NOTE — Assessment & Plan Note (Signed)
Recommend leg elevation and compression stocking. 

## 2024-01-31 NOTE — Progress Notes (Signed)
 Hematology/Oncology Progress note [phone] [phone]     CHIEF COMPLAINTS/REASON FOR VISIT:  right lower extremity DVT   ASSESSMENT & PLAN:   History of DVT (deep vein thrombosis) Previous work up showed negative  prothrombin gene mutation, factor V Leiden mutation, Oct 2023  right lower extremity ultrasound negative.  Continue  Eliquis  to 2.5mg  BID, long term maintenance.   Post-thrombotic syndrome of right lower extremity Recommend leg elevation and compression stocking.  Microcytic anemia Likely due to bleeding after recent colonoscopy polyp removal.  Recommend empiric Vitron C 1 tab daily for 1-2 months - OTC.  He may hole off Eliquis  2.5mg  BID for 3 days to allow healing.    Orders Placed This Encounter  Procedures   CMP (Cancer Center only)    Standing Status:   Future    Expected Date:   08/01/2024    Expiration Date:   01/30/2025   CBC with Differential (Cancer Center Only)    Standing Status:   Future    Expected Date:   08/01/2024    Expiration Date:   01/30/2025   Follow up in 6 months.  All questions were answered. The patient knows to call the clinic with any problems, questions or concerns.  Timmy Forbes, MD, PhD [facility] Hematology Oncology 01/31/2024      HISTORY OF PRESENTING ILLNESS:   Curtis Black. is a  83 y.o.  male with PMH listed below was seen in consultation at the request of  Gabriel John, NP  for evaluation of right lower extremity DVT Patient has chronic back pain with intermittent right swelling pain.  He follows up with neurosurgeon.   03/02/2022, patient was seen by primary care provider.  He has noticed his chronic right lower extremity swelling worse.  Ultrasound was ordered for further evaluation.  Same day lower extremity ultrasound showed DVT of the posterior tibial vein,  Patient has history of prostate cancer, diagnosed in 2009 recent PSA is undetectable. Patient has been started on Eliquis  and  currently on Eliquis  5 mg twice daily.  He tolerates well.  Denies any bleeding events.  Right lower extremity swelling is slightly better however not completely resolved.  Patient denies any family history of VTE or personal history  of previous VTE.  Denies any immobilization factors which may contribute to his DVT   INTERVAL HISTORY Curtis Black. is a 83 y.o. male who has above history reviewed by me today presents for follow up visit for right lower extremity DVT Patient has been off Eliquis  2.5mg  BID. He takes care of his sister who was undergoing treatment for sarcoma and colorectal cancer.  Chronic right lower extremity edema, unchanged.  He denies chest pain, SOB, calf pain. No new complains.  Recent colonoscopy with removal of polyps. No bleeding events.   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  Neoplasm of uncertain behavior of stomach, intestines, and rectum    Prostate cancer (HCC) 2009   Psoriasis     SURGICAL HISTORY: Past Surgical History:  Procedure Laterality Date   APPENDECTOMY     BOWEL RESECTION     CATARACT EXTRACTION W/PHACO Right 01/05/2021   Procedure: CATARACT EXTRACTION PHACO AND INTRAOCULAR LENS PLACEMENT (IOC) RIGHT OMIDRIA ;  Surgeon: Annell Kidney, MD;  Location: [facility];  Service: Ophthalmology;  Laterality: Right;  cde 5.46 01:01.7 minutes 8.9%   CATARACT EXTRACTION W/PHACO  Left 01/26/2021   Procedure: CATARACT EXTRACTION PHACO AND INTRAOCULAR LENS PLACEMENT (IOC) LEFT OMIDRIA  5.35 00:50.6;  Surgeon: Annell Kidney, MD;  Location: [facility];  Service: Ophthalmology;  Laterality: Left;   COLON SURGERY     COLONOSCOPY N/A 12/27/2023   Procedure: COLONOSCOPY;  Surgeon: Quintin Buckle, DO;  Location: [facility] ENDOSCOPY;  Service: Gastroenterology;  Laterality: N/A;   COLONOSCOPY WITH PROPOFOL  N/A 06/05/2017   Procedure: COLONOSCOPY WITH PROPOFOL ;  Surgeon: Luke Salaam, MD;  Location: [facility] ENDOSCOPY;  Service: Gastroenterology;  Laterality: N/A;   COLONOSCOPY WITH PROPOFOL  N/A 06/15/2020   Procedure: COLONOSCOPY WITH PROPOFOL ;  Surgeon: Luke Salaam, MD;  Location: [facility] ENDOSCOPY;  Service: Gastroenterology;  Laterality: N/A;   COLONOSCOPY WITH PROPOFOL  N/A 11/16/2020   Procedure: COLONOSCOPY WITH PROPOFOL ;  Surgeon: Luke Salaam, MD;  Location: [facility] ENDOSCOPY;  Service: Gastroenterology;  Laterality: N/A;   EYE SURGERY     KNEE ARTHROSCOPY Right    POLYPECTOMY  12/27/2023   Procedure: POLYPECTOMY, INTESTINE;  Surgeon: Quintin Buckle, DO;  Location: [facility] ENDOSCOPY;  Service: Gastroenterology;;   PROSTATECTOMY     thyroid  biop      SOCIAL HISTORY: Social History   Socioeconomic History   Marital status: Married    Spouse name: Not on file   Number of children: Not on file   Years of education: Not on file   Highest education level: Doctorate  Occupational History   Not on file  Tobacco Use   Smoking status: Former    Current packs/day: 0.00    Average packs/day: 2.0 packs/day for 15.0 years (30.0 ttl pk-yrs)    Types: Cigarettes    Start date: 25    Quit date: 1998    Years since quitting: 27.4   Smokeless tobacco: Never  Vaping Use   Vaping status: Never Used  Substance and Sexual Activity   Alcohol use: Yes    Alcohol/week: 7.0 standard drinks of alcohol    Types: 7 Glasses of wine per week    Comment: occasionally   Drug  use: Not Currently   Sexual activity: Not Currently  Other Topics Concern   Not on file  Social History Narrative   Married.   No children.   Retired. Once worked for [employer].   Enjoys working on his house, playing golf, riding his bike.    Social Drivers of Corporate investment banker Strain: Low Risk  (07/04/2023)   Overall Financial Resource Strain (CARDIA)    Difficulty of Paying Living Expenses: Not very hard  Food Insecurity: No Food Insecurity (07/04/2023)   Hunger Vital Sign    Worried About Running Out of Food in the Last Year: Never true    Ran Out of Food in the Last Year: Never true  Transportation Needs: No Transportation Needs (07/04/2023)   PRAPARE - Administrator, Civil Service (Medical): No    Lack of Transportation (Non-Medical): No  Physical Activity: Sufficiently Active (07/04/2023)  Exercise Vital Sign    Days of Exercise per Week: 3 days    Minutes of Exercise per Session: 150+ min  Stress: No Stress Concern Present (07/04/2023)   [employer] of Occupational Health - Occupational Stress Questionnaire    Feeling of Stress : Only a little  Social Connections: Unknown (07/04/2023)   Social Connection and Isolation Panel [NHANES]    Frequency of Communication with Friends and Family: More than three times a week    Frequency of Social Gatherings with Friends and Family: More than three times a week    Attends Religious Services: Patient declined    Database administrator or Organizations: Yes    Attends Engineer, structural: More than 4 times per year    Marital Status: Married  Recent Concern: Social Connections - Moderately Isolated (06/28/2023)   Social Connection and Isolation Panel [NHANES]    Frequency of Communication with Friends and Family: More than three times a week    Frequency of Social Gatherings with Friends and Family: Once a week    Attends Religious Services: Never    Database administrator or Organizations: No     Attends Banker Meetings: Never    Marital Status: Married  Catering manager Violence: Not At Risk (06/28/2023)   Humiliation, Afraid, Rape, and Kick questionnaire    Fear of Current or Ex-Partner: No    Emotionally Abused: No    Physically Abused: No    Sexually Abused: No    FAMILY HISTORY: Family History  Problem Relation Age of Onset   Emphysema Father    Diabetes Sister     ALLERGIES:  is allergic to lidocaine .  MEDICATIONS:  Current Outpatient Medications  Medication Sig Dispense Refill   amLODipine  (NORVASC ) 10 MG tablet Take 1 tablet (10 mg total) by mouth daily. for blood pressure. 90 tablet 3   atorvastatin  (LIPITOR) 40 MG tablet Take 1 tablet (40 mg total) by mouth daily. for cholesterol. 90 tablet 3   cetirizine (ZYRTEC) 10 MG tablet Take 10 mg by mouth daily.     Coenzyme Q10 (COQ10 PO) Take 1 capsule by mouth daily.      GLUCOSAMINE SULFATE PO Take by mouth.     Iron-Vitamin C 65-125 MG TABS Take 1 tablet by mouth daily.     losartan -hydrochlorothiazide  (HYZAAR) 100-12.5 MG tablet TAKE 1 TABLET DAILY FOR    BLOOD PRESSURE 90 tablet 3   Multiple Vitamin (MULTIVITAMIN) tablet Take 1 tablet by mouth daily.     Omega-3 Fatty Acids (FISH OIL) 1000 MG CAPS Take 1,000 mg by mouth 2 (two) times daily.     sertraline  (ZOLOFT ) 50 MG tablet TAKE 1 TABLET (50 MG TOTAL) BY MOUTH DAILY. FOR ANXIETY. 90 tablet 2   triamcinolone  cream (KENALOG ) 0.1 % APPLY TWICE DAILY FOR 5 DAYS WEEKLY AS NEEDED TO AFFECTED AREAS OF BODY FOR PSORIASIS. AVOID APPLYING TO FACE, GROIN & UNDERARMS. USE AS DIRECTED. 80 g 2   apixaban  (ELIQUIS ) 2.5 MG TABS tablet Take 1 tablet (2.5 mg total) by mouth 2 (two) times daily. 180 tablet 1   No current facility-administered medications for this visit.    Review of Systems  Constitutional:  Negative for appetite change, chills, fatigue, fever and unexpected weight change.  HENT:   Negative for hearing loss and voice change.   Eyes:  Negative  for eye problems and icterus.  Respiratory:  Negative for chest tightness, cough and shortness of breath.   Cardiovascular:  Positive for leg swelling. Negative for chest pain.  Gastrointestinal:  Negative for abdominal distention and abdominal pain.  Endocrine: Negative for hot flashes.  Genitourinary:  Negative for difficulty urinating, dysuria and frequency.   Musculoskeletal:  Negative for arthralgias.  Skin:  Negative for itching and rash.  Neurological:  Negative for light-headedness and numbness.  Hematological:  Negative for adenopathy. Does not bruise/bleed easily.  Psychiatric/Behavioral:  Negative for confusion.    PHYSICAL EXAMINATION: ECOG PERFORMANCE STATUS: 0 - Asymptomatic Vitals:   01/31/24 1011 01/31/24 1016  BP: (!) 159/74 (!) 159/74  Pulse:    Resp:    Temp:    SpO2:     Filed Weights   01/31/24 1006  Weight: 253 lb 4.8 oz (114.9 kg)    Physical Exam Constitutional:      General: He is not in acute distress. HENT:     Head: Normocephalic and atraumatic.  Eyes:     General: No scleral icterus. Cardiovascular:     Rate and Rhythm: Normal rate and regular rhythm.     Heart sounds: Normal heart sounds.  Pulmonary:     Effort: Pulmonary effort is normal. No respiratory distress.     Breath sounds: No wheezing.  Abdominal:     General: Bowel sounds are normal. There is no distension.     Palpations: Abdomen is soft.  Musculoskeletal:        General: No deformity. Normal range of motion.     Cervical back: Normal range of motion and neck supple.     Comments: Trace right lower extremity edema.   Skin:    General: Skin is warm and dry.     Findings: No erythema or rash.  Neurological:     Mental Status: He is alert and oriented to person, place, and time. Mental status is at baseline.     Cranial Nerves: No cranial nerve deficit.     Coordination: Coordination normal.  Psychiatric:        Mood and Affect: Mood normal.     LABORATORY DATA:  I  have reviewed the data as listed    Latest Ref Rng & Units 01/31/2024    9:46 AM 07/31/2023    9:47 AM 12/15/2022    9:21 AM  CBC  WBC 4.0 - 10.5 K/uL 6.5  5.5  6.8   Hemoglobin 13.0 - 17.0 g/dL 16.1  09.6  04.5   Hematocrit 39.0 - 52.0 % 38.1  41.5  40.0   Platelets 150 - 400 K/uL 246  218  216       Latest Ref Rng & Units 01/31/2024    9:47 AM 07/31/2023    9:46 AM 07/11/2023    3:22 PM  CMP  Glucose 70 - 99 mg/dL 95  409  96   BUN 8 - 23 mg/dL 24  24  24    Creatinine 0.61 - 1.24 mg/dL 8.11  9.14  7.82   Sodium 135 - 145 mmol/L 141  140  141   Potassium 3.5 - 5.1 mmol/L 3.6  3.8  3.8   Chloride 98 - 111 mmol/L 106  102  103   CO2 22 - 32 mmol/L 25  27  29    Calcium  8.9 - 10.3 mg/dL 9.2  9.4  9.8   Total Protein 6.5 - 8.1 g/dL 7.5  7.3  7.6   Total Bilirubin 0.0 - 1.2 mg/dL 0.8  0.7  0.5   Alkaline Phos 38 - 126 U/L 58  50  65   AST 15 - 41 U/L 29  47  38   ALT 0 - 44 U/L 23  42  34       RADIOGRAPHIC STUDIES: I have personally reviewed the radiological images as listed and agreed with the findings in the report. No results found.

## 2024-01-31 NOTE — Assessment & Plan Note (Addendum)
Previous work up showed negative  prothrombin gene mutation, factor V Leiden mutation, Oct 2023  right lower extremity ultrasound negative.  Continue  Eliquis to 2.5mg  BID, long term maintenance.

## 2024-04-09 ENCOUNTER — Ambulatory Visit: Payer: Self-pay

## 2024-04-09 NOTE — Telephone Encounter (Signed)
 Noted, will evaluate.

## 2024-04-09 NOTE — Telephone Encounter (Signed)
 FYI Only or Action Required?: FYI only for provider.  Patient was last seen in primary care on 07/11/2023 by Gretta Comer POUR, NP.  Called Nurse Triage reporting Leg Swelling.  Symptoms began several weeks ago.  Interventions attempted: Nothing.  Symptoms are: gradually worsening.  Triage Disposition: See PCP When Office is Open (Within 3 Days)  Patient/caregiver understands and will follow disposition?: Yes  Copied from CRM [id number]. Topic: Clinical - Red Word Triage >> Apr 09, 2024 10:53 AM Kathrin PARAS wrote: Red Word that prompted transfer to Nurse Triage: Curtis Black wanted to see his doctor because his leg is still swelling. He said he's seen her before about this issue Reason for Disposition  [1] MILD swelling of both ankles (i.e., pedal edema) AND [2] new-onset or getting worse  Answer Assessment - Initial Assessment Questions 1. ONSET: When did the swelling start? (e.g., minutes, hours, days) Ongoing for year and worsening      2. LOCATION: What part of the leg is swollen?  Are both legs swollen or just one leg?     Right  and left leg swelling : and right is worst  3. SEVERITY: How bad is the swelling? (e.g., localized; mild, moderate, severe)     moderate 4. REDNESS: Is there redness or signs of infection?     yes 5. PAIN: Is the swelling painful to touch? If Yes, ask: How painful is it?   (Scale 1-10; mild, moderate or severe)     Mild discomfort 6. FEVER: Do you have a fever? If Yes, ask: What is it, how was it measured, and when did it start?      no 7. CAUSE: What do you think is causing the leg swelling?     unknown 8. MEDICAL HISTORY: Do you have a history of blood clots (e.g., DVT), cancer, heart failure, kidney disease, or liver failure?     na 9. RECURRENT SYMPTOM: Have you had leg swelling before? If Yes, ask: When was the last time? What happened that time?     yes 10. OTHER SYMPTOMS: Do you have any other symptoms? (e.g.,  chest pain, difficulty breathing)       no 11. PREGNANCY: Is there any chance you are pregnant? When was your last menstrual period?       Na  Shoes are tight and patient states ankle and some of the leg: swelling ends just below knee.  Pt states this has been an issue for around 1 year and wants to speak to PCP to discuss possible specialist or something.   Scheduled appt  Protocols used: Leg Swelling and Edema-A-AH

## 2024-04-10 ENCOUNTER — Ambulatory Visit (INDEPENDENT_AMBULATORY_CARE_PROVIDER_SITE_OTHER): Admitting: Primary Care

## 2024-04-10 ENCOUNTER — Encounter: Payer: Self-pay | Admitting: Primary Care

## 2024-04-10 VITALS — BP 144/86 | HR 60 | Temp 97.3°F | Ht 74.0 in | Wt 254.0 lb

## 2024-04-10 DIAGNOSIS — I10 Essential (primary) hypertension: Secondary | ICD-10-CM

## 2024-04-10 DIAGNOSIS — R600 Localized edema: Secondary | ICD-10-CM

## 2024-04-10 LAB — BASIC METABOLIC PANEL WITH GFR
BUN: 29 mg/dL — ABNORMAL HIGH (ref 6–23)
CO2: 30 meq/L (ref 19–32)
Calcium: 9.9 mg/dL (ref 8.4–10.5)
Chloride: 103 meq/L (ref 96–112)
Creatinine, Ser: 1.4 mg/dL (ref 0.40–1.50)
GFR: 47.26 mL/min — ABNORMAL LOW (ref >60.00)
Glucose, Bld: 96 mg/dL (ref 70–99)
Potassium: 3.7 meq/L (ref 3.5–5.1)
Sodium: 143 meq/L (ref 135–145)

## 2024-04-10 LAB — BRAIN NATRIURETIC PEPTIDE: Pro B Natriuretic peptide (BNP): 31 pg/mL (ref 0.0–100.0)

## 2024-04-10 MED ORDER — LOSARTAN POTASSIUM-HCTZ 100-25 MG PO TABS: 1.0000 | ORAL_TABLET | Freq: Every day | ORAL | 0 refills | Status: DC

## 2024-04-10 NOTE — Assessment & Plan Note (Signed)
 Differentials include venous insufficiency, amlodipine  side effects, CHF.  Labs pending today for BNP and BMP. Referral placed to vascular services. Echocardiogram ordered and pending.  Stop amlodipine  10 mg daily. Increase losartan -hydrochlorothiazide  to 100-25 mg daily.  Follow-up in 2 weeks.

## 2024-04-10 NOTE — Progress Notes (Signed)
 Subjective:    Patient ID: Curtis JINNY Sheri Mickey., male    DOB: 1941/07/22, 83 y.o.   MRN: [id number]  HPI  Curtis Maybee. is a very pleasant 83 y.o. male with a history of DVT, CKD, prostate cancer, microcytic anemia, chronic back pain who presents today to discuss lower extremity swelling.  Chronic for the last 1 year, his swelling is mostly localized below the calf through the toes bilaterally. His right lower extremity swelling is worse than left.  His left lower extremity has been bothering him more recently.  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 He sometimes elevates his legs when resting. When waking in the morning and wearing compression hose his swelling will improve.   He has not seen vascular services. He is managed on amlodipine  10 mg daily for hypertension. He denies chest pain, exertional shortness of breath. He does have an intermittent cough that began about 6 months ago. He has not completed an echocardiogram.   BP Readings from Last 3 Encounters:  04/10/24 (!) 144/86  01/31/24 (!) 159/74  12/27/23 (!) 89/45      Review of Systems  Constitutional:  Negative for fatigue.  Respiratory:  Positive for cough. Negative for shortness of breath.   Cardiovascular:  Positive for leg swelling. Negative for chest pain.         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    Neoplasm of uncertain  behavior of stomach, intestines, and rectum    Prostate cancer (HCC) 2009   Psoriasis     Social History   Socioeconomic History   Marital status: Married    Spouse name: Not on file   Number of children: Not on file   Years of education: Not on file   Highest education level: Doctorate  Occupational History   Not on file  Tobacco Use   Smoking status: Former    Current packs/day: 0.00    Average packs/day: 2.0 packs/day for 15.0 years (30.0 ttl pk-yrs)    Types: Cigarettes    Start date: 26    Quit date: 72    Years since quitting: 27.6   Smokeless tobacco: Never  Vaping Use   Vaping status: Never Used  Substance and Sexual Activity   Alcohol use: Yes    Alcohol/week: 7.0 standard drinks of alcohol    Types: 7 Glasses of wine per week    Comment: occasionally   Drug use: Not Currently   Sexual activity: Not Currently  Other Topics Concern   Not on file  Social History Narrative   Married.   No children.   Retired. Once worked for [employer].   Enjoys working on his house, playing golf, riding his bike.    Social Drivers of Corporate investment banker Strain: Low Risk  (07/04/2023)   Overall Physicist, medical  Strain (CARDIA)    Difficulty of Paying Living Expenses: Not very hard  Food Insecurity: No Food Insecurity (07/04/2023)   Hunger Vital Sign    Worried About Running Out of Food in the Last Year: Never true    Ran Out of Food in the Last Year: Never true  Transportation Needs: No Transportation Needs (07/04/2023)   PRAPARE - Administrator, Civil Service (Medical): No    Lack of Transportation (Non-Medical): No  Physical Activity: Sufficiently Active (07/04/2023)   Exercise Vital Sign    Days of Exercise per Week: 3 days    Minutes of Exercise per Session: 150+ min  Stress: No Stress Concern Present (07/04/2023)   [employer] of Occupational Health - Occupational Stress Questionnaire    Feeling of Stress : Only a little  Social  Connections: Unknown (07/04/2023)   Social Connection and Isolation Panel    Frequency of Communication with Friends and Family: More than three times a week    Frequency of Social Gatherings with Friends and Family: More than three times a week    Attends Religious Services: Patient declined    Database administrator or Organizations: Yes    Attends Engineer, structural: More than 4 times per year    Marital Status: Married  Recent Concern: Social Connections - Moderately Isolated (06/28/2023)   Social Connection and Isolation Panel    Frequency of Communication with Friends and Family: More than three times a week    Frequency of Social Gatherings with Friends and Family: Once a week    Attends Religious Services: Never    Database administrator or Organizations: No    Attends Banker Meetings: Never    Marital Status: Married  Catering manager Violence: Not At Risk (06/28/2023)   Humiliation, Afraid, Rape, and Kick questionnaire    Fear of Current or Ex-Partner: No    Emotionally Abused: No    Physically Abused: No    Sexually Abused: No    Past Surgical History:  Procedure Laterality Date   APPENDECTOMY     BOWEL RESECTION     CATARACT EXTRACTION W/PHACO Right 01/05/2021   Procedure: CATARACT EXTRACTION PHACO AND INTRAOCULAR LENS PLACEMENT (IOC) RIGHT OMIDRIA ;  Surgeon: Mittie Gaskin, MD;  Location: [facility];  Service: Ophthalmology;  Laterality: Right;  cde 5.46 01:01.7 minutes 8.9%   CATARACT EXTRACTION W/PHACO Left 01/26/2021   Procedure: CATARACT EXTRACTION PHACO AND INTRAOCULAR LENS PLACEMENT (IOC) LEFT OMIDRIA  5.35 00:50.6;  Surgeon: Mittie Gaskin, MD;  Location: [facility];  Service: Ophthalmology;  Laterality: Left;   COLON SURGERY     COLONOSCOPY N/A 12/27/2023   Procedure: COLONOSCOPY;  Surgeon: Onita Elspeth Sharper, DO;  Location: [facility];  Service: Gastroenterology;  Laterality: N/A;   COLONOSCOPY WITH  PROPOFOL  N/A 06/05/2017   Procedure: COLONOSCOPY WITH PROPOFOL ;  Surgeon: Therisa Bi, MD;  Location: [facility] END[facility]COPY;  Service: Gastroenterology;  Laterality: N/A;   COLONOSCOPY WITH PROPOFOL  N/A 06/15/2020   Procedure: COLONOSCOPY WITH PROPOFOL ;  Surgeon: Therisa Bi, MD;  Location: [facility] ENDOSCOPY;  Service: Gastroenterology;  Laterality: N/A;   COLONOSCOPY WITH PROPOFOL  N/A 11/16/2020   Procedure: COLONOSCOPY WITH PROPOFOL ;  Surgeon: Therisa Bi, MD;  Location: [facility] ENDOSCOPY;  Service: Gastroenterology;  Laterality: N/A;   EYE SURGERY     KNEE ARTHROSCOPY Right    POLYPECTOMY  12/27/2023   Procedure: POLYPECTOMY, INTESTINE;  Surgeon: Onita Elspeth Sharper, DO;  Location: [facility] ENDOSCOPY;  Service: Gastroenterology;;   PROSTATECTOMY  thyroid  biop      Family History  Problem Relation Age of Onset   Emphysema Father    Diabetes Sister     Allergies  Allergen Reactions   Lidocaine      Cut off wind    Current Outpatient Medications on File Prior to Visit  Medication Sig Dispense Refill   apixaban  (ELIQUIS ) 2.5 MG TABS tablet Take 1 tablet (2.5 mg total) by mouth 2 (two) times daily. 180 tablet 1   atorvastatin  (LIPITOR) 40 MG tablet Take 1 tablet (40 mg total) by mouth daily. for cholesterol. 90 tablet 3   cetirizine (ZYRTEC) 10 MG tablet Take 10 mg by mouth daily.     Coenzyme Q10 (COQ10 PO) Take 1 capsule by mouth daily.      GLUCOSAMINE SULFATE PO Take by mouth.     Iron -Vitamin C  65-125 MG TABS Take 1 tablet by mouth daily.     Multiple Vitamin (MULTIVITAMIN) tablet Take 1 tablet by mouth daily.     Omega-3 Fatty Acids (FISH OIL) 1000 MG CAPS Take 1,000 mg by mouth 2 (two) times daily.     sertraline  (ZOLOFT ) 50 MG tablet TAKE 1 TABLET (50 MG TOTAL) BY MOUTH DAILY. FOR ANXIETY. 90 tablet 2   triamcinolone  cream (KENALOG ) 0.1 % APPLY TWICE DAILY FOR 5 DAYS WEEKLY AS NEEDED TO AFFECTED AREAS OF BODY FOR PSORIASIS. AVOID APPLYING TO FACE, GROIN & UNDERARMS. USE AS DIRECTED. 80 g  2   No current facility-administered medications on file prior to visit.    BP (!) 144/86   Pulse 60   Temp (!) 97.3 F (36.3 C) (Temporal)   Ht 6' 2 (1.88 m)   Wt 254 lb (115.2 kg)   SpO2 100%   BMI 32.61 kg/m  Objective:   Physical Exam Cardiovascular:     Rate and Rhythm: Normal rate and regular rhythm.     Pulses:          Dorsalis pedis pulses are 2+ on the right side and 2+ on the left side.       Posterior tibial pulses are 2+ on the right side and 2+ on the left side.     Comments: Bilateral lower extremity edema noted from distal calf through toes, right greater than left.  No pitting. Pulmonary:     Effort: Pulmonary effort is normal.     Breath sounds: Normal breath sounds.  Musculoskeletal:     Cervical back: Neck supple.     Right lower leg: 2+ Edema present.     Left lower leg: 1+ Edema present.  Skin:    General: Skin is warm and dry.  Neurological:     Mental Status: He is alert and oriented to person, place, and time.  Psychiatric:        Mood and Affect: Mood normal.           Assessment & Plan:  Bilateral lower extremity edema Assessment & Plan: Differentials include venous insufficiency, amlodipine  side effects, CHF.  Labs pending today for BNP and BMP. Referral placed to vascular services. Echocardiogram ordered and pending.  Stop amlodipine  10 mg daily. Increase losartan -hydrochlorothiazide  to 100-25 mg daily.  Follow-up in 2 weeks.  Orders: -     ECHOCARDIOGRAM COMPLETE; Future -     Brain natriuretic peptide -     Basic metabolic panel with GFR -     Ambulatory referral to Vascular Surgery  Essential hypertension Assessment & Plan: Above goal today.  I have asked  that he start monitoring at home.  Given symptoms today: Stop amlodipine  10 mg daily. Increase losartan -hydrochlorothiazide  to 100-25 mg daily.  Follow-up in 2 weeks.  Orders: -     Losartan  Potassium-HCTZ; Take 1 tablet by mouth daily. for blood pressure.   Dispense: 90 tablet; Refill: 0        Comer MARLA Gaskins, NP

## 2024-04-10 NOTE — Patient Instructions (Signed)
 Stop taking amlodipine  blood pressure pill as it may be causing some swelling  We increased the dose of your losartan -hydrochlorothiazide  to 100-25 mg daily.  I sent a new prescription to your mail order pharmacy.  Stop taking the lower dose.  Elevate your legs when resting.  Wear compression hose.  You will either be contacted via phone regarding your referral to vascular services, or you may receive a letter on your MyChart portal from our referral team with instructions for scheduling an appointment. Please let us  know if you have not been contacted by anyone within two weeks.  You will receive a phone call regarding the echocardiogram  Stop by the lab prior to leaving today. I will notify you of your results once received.   It was a pleasure to see you today!

## 2024-04-10 NOTE — Assessment & Plan Note (Signed)
 Above goal today.  I have asked that he start monitoring at home.  Given symptoms today: Stop amlodipine  10 mg daily. Increase losartan -hydrochlorothiazide  to 100-25 mg daily.  Follow-up in 2 weeks.

## 2024-04-11 ENCOUNTER — Ambulatory Visit: Payer: Self-pay | Admitting: Primary Care

## 2024-04-22 ENCOUNTER — Encounter (INDEPENDENT_AMBULATORY_CARE_PROVIDER_SITE_OTHER): Payer: Self-pay | Admitting: Vascular Surgery

## 2024-04-22 ENCOUNTER — Ambulatory Visit (INDEPENDENT_AMBULATORY_CARE_PROVIDER_SITE_OTHER): Admitting: Vascular Surgery

## 2024-04-22 VITALS — BP 149/93 | HR 59 | Ht 74.0 in | Wt 251.2 lb

## 2024-04-22 DIAGNOSIS — Z86718 Personal history of other venous thrombosis and embolism: Secondary | ICD-10-CM

## 2024-04-22 DIAGNOSIS — E785 Hyperlipidemia, unspecified: Secondary | ICD-10-CM

## 2024-04-22 DIAGNOSIS — I10 Essential (primary) hypertension: Secondary | ICD-10-CM | POA: Diagnosis not present

## 2024-04-22 DIAGNOSIS — R600 Localized edema: Secondary | ICD-10-CM | POA: Diagnosis not present

## 2024-04-22 NOTE — Progress Notes (Signed)
 Subjective:    Patient ID: Curtis Black., male    DOB: [birth year], 83 y.o.   MRN: [id number] Chief Complaint  Patient presents with   Follow-up    Curtis Black is an 83 yo male who presents to clinic today with chief complaint of bilateral lower extremity swelling.  Patient endorses since he seen his cardiology 2 to 3 weeks ago he is doing well.  They took him off amlodipine  and since that time the swelling in his legs has resolved.  However he does have some increased color change to bilateral feet.  This could be from some vascular insufficiency.  He was also noted to have a DVT in his right lower extremity back in July 2023.  There was a repeat ultrasound done in October 2023 which showed it resolved.  At that time it also states that he did not have any reflux.  Patient endorses he is doing conservative therapy such as compression socks, rest and elevation as well as exercise.  He endorses he is playing golf again.  He does not have any open sores wounds or infections noted to his bilateral lower extremities today.  Denies any pain either walking or at rest.    Review of Systems  Constitutional: Negative.   Skin:  Positive for color change.  All other systems reviewed and are negative.      Objective:   Physical Exam Constitutional:      Appearance: Normal appearance. He is normal weight.  HENT:     Head: Normocephalic.  Eyes:     Pupils: Pupils are equal, round, and reactive to light.  Cardiovascular:     Rate and Rhythm: Normal rate and regular rhythm.     Pulses: Normal pulses.     Heart sounds: Normal heart sounds.  Pulmonary:     Effort: Pulmonary effort is normal.     Breath sounds: Normal breath sounds.  Abdominal:     General: Abdomen is flat. Bowel sounds are normal.     Palpations: Abdomen is soft.  Musculoskeletal:        General: Normal range of motion.  Skin:    General: Skin is warm and dry.     Capillary Refill: Capillary refill takes 2 to 3 seconds.      Comments: Bilateral lower color change to his feet.  Pooling noticed.  Neurological:     General: No focal deficit present.     Mental Status: He is alert and oriented to person, place, and time. Mental status is at baseline.  Psychiatric:        Mood and Affect: Mood normal.        Behavior: Behavior normal.        Thought Content: Thought content normal.        Judgment: Judgment normal.     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    Neoplasm of uncertain behavior of stomach, intestines, and rectum    Prostate cancer (HCC) 2009   Psoriasis     Social History   Socioeconomic History   Marital status: Married    Spouse name: Not on file   Number of children: Not on file   Years of education: Not on file   Highest education level: Doctorate  Occupational History   Not on file  Tobacco Use   Smoking status: Former    Current packs/day: 0.00    Average packs/day: 2.0 packs/day for 15.0 years (30.0 ttl pk-yrs)    Types: Cigarettes    Start date: 88    Quit date: 22    Years since quitting: 27.6   Smokeless tobacco: Never  Vaping Use   Vaping status: Never Used  Substance and Sexual Activity   Alcohol use: Yes    Alcohol/week: 7.0 standard drinks of alcohol    Types: 7 Glasses of wine per week    Comment: occasionally   Drug use:  Not Currently   Sexual activity: Not Currently  Other Topics Concern   Not on file  Social History Narrative   Married.   No children.   Retired. Once worked for [employer].   Enjoys working on his house, playing golf, riding his bike.    Social Drivers of Corporate investment banker Strain: Low Risk  (07/04/2023)   Overall Financial Resource Strain (CARDIA)    Difficulty of Paying Living Expenses: Not very hard  Food Insecurity: No Food Insecurity (07/04/2023)   Hunger Vital Sign    Worried About Running Out of Food in the Last Year: Never true    Ran Out of Food in the Last Year: Never true  Transportation Needs: No Transportation Needs (07/04/2023)   PRAPARE - Administrator, Civil Service (Medical): No    Lack of Transportation (Non-Medical): No  Physical Activity: Sufficiently Active (07/04/2023)   Exercise Vital Sign    Days of Exercise per Week: 3 days    Minutes of Exercise per Session: 150+ min  Stress: No Stress Concern Present (07/04/2023)   [employer] of Occupational Health - Occupational Stress Questionnaire    Feeling of Stress : Only a little  Social Connections: Unknown (07/04/2023)   Social Connection and Isolation Panel    Frequency of Communication with Friends and Family: More than three times a week    Frequency of Social Gatherings with Friends and Family: More than three times a week    Attends Religious Services: Patient declined    Database administrator or Organizations: Yes    Attends Engineer, structural: More than 4 times per year    Marital Status: Married  Recent Concern: Social Connections - Moderately Isolated (06/28/2023)   Social Connection and Isolation Panel    Frequency of Communication with Friends and Family: More than three times a week    Frequency of Social Gatherings with Friends and Family: Once a week    Attends Religious Services: Never    Database administrator or Organizations: No    Attends Tax inspector Meetings: Never    Marital Status: Married  Catering manager Violence: Not At Risk (06/28/2023)   Humiliation, Afraid, Rape, and Kick questionnaire    Fear of Current  or Ex-Partner: No    Emotionally Abused: No    Physically Abused: No    Sexually Abused: No    Past Surgical History:  Procedure Laterality Date   APPENDECTOMY     BOWEL RESECTION     CATARACT EXTRACTION W/PHACO Right 01/05/2021   Procedure: CATARACT EXTRACTION PHACO AND INTRAOCULAR LENS PLACEMENT (IOC) RIGHT OMIDRIA ;  Surgeon: Mittie Gaskin, MD;  Location: [facility];  Service: Ophthalmology;  Laterality: Right;  cde 5.46 01:01.7 minutes 8.9%   CATARACT EXTRACTION W/PHACO Left 01/26/2021   Procedure: CATARACT EXTRACTION PHACO AND INTRAOCULAR LENS PLACEMENT (IOC) LEFT OMIDRIA  5.35 00:50.6;  Surgeon: Mittie Gaskin, MD;  Location: [facility];  Service: Ophthalmology;  Laterality: Left;   COLON SURGERY     COLONOSCOPY N/A 12/27/2023   Procedure: COLONOSCOPY;  Surgeon: Onita Elspeth Sharper, DO;  Location: [facility] ENDOSCOPY;  Service: Gastroenterology;  Laterality: N/A;   COLONOSCOPY WITH PROPOFOL  N/A 06/05/2017   Procedure: COLONOSCOPY WITH PROPOFOL ;  Surgeon: Therisa Bi, MD;  Location: [facility] ENDOSCOPY;  Service: Gastroenterology;  Laterality: N/A;   COLONOSCOPY WITH PROPOFOL  N/A 06/15/2020   Procedure: COLONOSCOPY WITH PROPOFOL ;  Surgeon: Therisa Bi, MD;  Location: [facility] END[facility]COPY;  Service: Gastroenterology;  Laterality: N/A;   COLONOSCOPY WITH PROPOFOL  N/A 11/16/2020   Procedure: COLONOSCOPY WITH PROPOFOL ;  Surgeon: Therisa Bi, MD;  Location: [facility];  Service: Gastroenterology;  Laterality: N/A;   EYE SURGERY     KNEE ARTHROSCOPY Right    POLYPECTOMY  12/27/2023   Procedure: POLYPECTOMY, INTESTINE;  Surgeon: Onita Elspeth Sharper, DO;  Location: [facility] ENDOSCOPY;  Service: Gastroenterology;;   PROSTATECTOMY     thyroid  biop      Family History  Problem Relation Age of  Onset   Emphysema Father    Diabetes Sister     Allergies  Allergen Reactions   Lidocaine      Cut off wind       Latest Ref Rng & Units 01/31/2024    9:46 AM 07/31/2023    9:47 AM 12/15/2022    9:21 AM  CBC  WBC 4.0 - 10.5 K/uL 6.5  5.5  6.8   Hemoglobin 13.0 - 17.0 g/dL 87.9  86.5  85.7   Hematocrit 39.0 - 52.0 % 38.1  41.5  40.0   Platelets 150 - 400 K/uL 246  218  216       CMP     Component Value Date/Time   NA 143 04/10/2024 1225   K 3.7 04/10/2024 1225   CL 103 04/10/2024 1225   CO2 30 04/10/2024 1225   GLUCOSE 96 04/10/2024 1225   BUN 29 (H) 04/10/2024 1225   CREATININE 1.40 04/10/2024 1225   CREATININE 1.41 (H) 01/31/2024 0947   CALCIUM  9.9 04/10/2024 1225   PROT 7.5 01/31/2024 0947   ALBUMIN 4.2 01/31/2024 0947   AST 29 01/31/2024 0947   ALT 23 01/31/2024 0947   ALKPHOS 58 01/31/2024 0947   BILITOT 0.8 01/31/2024 0947   GFR 47.26 (L) 04/10/2024 1225   GFRNONAA 50 (L) 01/31/2024 0947     No results found.     Assessment & Plan:   1. Bilateral lower extremity edema (Primary) Patient presents to clinic today with bilateral lower extremity edema that is resolved.  Patient states he saw his cardiologist 3 weeks ago who took him off amlodipine , a calcium  channel blocker that can cause lower extremity swelling.  He endorses that his lower extremities are much less swollen.  He is not having any difficulty ambulating.  He is planning on playing golf tomorrow.  Patient states he will follow-up with us  as needed for any return of swelling to his bilateral lower extremities.  If the patient needs to see us  again he will need bilateral lower extremity venous ultrasounds to assess for DVTs and reflux.  2. Essential hypertension Continue antihypertensive medications as already ordered, these medications have been reviewed and there are no changes at this time.  3. History of DVT (deep vein thrombosis) Patient noted to have history of right lower extremity DVT.   This was found in August 2023 due to extensive right lower extremity pain with swelling.  Patient returned to see his physician in October 2023.  Venous ultrasounds were obtained at that time and clot had resolved.  Patient was kept on Eliquis  2.5 mg twice daily due to his history of bladder cancer.  He does not have any signs or symptoms of recurrent DVT at this time.  Does not complain of any pain on palpation.  Is not having any difficulties walking.  I recommend that he undergo follow-up venous ultrasound of his right lower extremity to further follow-up his DVTs due to his cancer history and his high risk of recurrent DVT.  Patient would rather wait and not do another ultrasound at this time.  He would much rather follow-up with us  for ultrasounds if he is having difficulties.  If the patient needs to see us  for any reason he will need bilateral lower extremity venous ultrasounds to check for DVT and reflux prior to seeing us .  4. Hyperlipidemia, unspecified hyperlipidemia type Continue statin as ordered and reviewed, no changes at this time   Current Outpatient Medications on File Prior to Visit  Medication Sig Dispense Refill   apixaban  (ELIQUIS ) 2.5 MG TABS tablet Take 1 tablet (2.5 mg total) by mouth 2 (two) times daily. 180 tablet 1   atorvastatin  (LIPITOR) 40 MG tablet Take 1 tablet (40 mg total) by mouth daily. for cholesterol. 90 tablet 3   cetirizine (ZYRTEC) 10 MG tablet Take 10 mg by mouth daily.     Coenzyme Q10 (COQ10 PO) Take 1 capsule by mouth daily.      GLUCOSAMINE SULFATE PO Take by mouth.     Iron -Vitamin C  65-125 MG TABS Take 1 tablet by mouth daily.     losartan -hydrochlorothiazide  (HYZAAR) 100-25 MG tablet Take 1 tablet by mouth daily. for blood pressure. 90 tablet 0   Multiple Vitamin (MULTIVITAMIN) tablet Take 1 tablet by mouth daily.     Omega-3 Fatty Acids (FISH OIL) 1000 MG CAPS Take 1,000 mg by mouth 2 (two) times daily.     sertraline  (ZOLOFT ) 50 MG tablet TAKE  1 TABLET (50 MG TOTAL) BY MOUTH DAILY. FOR ANXIETY. 90 tablet 2   triamcinolone  cream (KENALOG ) 0.1 % APPLY TWICE DAILY FOR 5 DAYS WEEKLY AS NEEDED TO AFFECTED AREAS OF BODY FOR PSORIASIS. AVOID APPLYING TO FACE, GROIN & UNDERARMS. USE AS DIRECTED. 80 g 2   No current facility-administered medications on file prior to visit.    There are no Patient Instructions on file for this visit. No follow-ups on file.   Curtis JONELLE Shank, NP

## 2024-04-23 ENCOUNTER — Other Ambulatory Visit: Payer: Self-pay | Admitting: Primary Care

## 2024-04-23 DIAGNOSIS — E785 Hyperlipidemia, unspecified: Secondary | ICD-10-CM

## 2024-04-25 ENCOUNTER — Encounter: Payer: Self-pay | Admitting: *Deleted

## 2024-04-30 ENCOUNTER — Encounter: Payer: Self-pay | Admitting: Primary Care

## 2024-04-30 ENCOUNTER — Ambulatory Visit (INDEPENDENT_AMBULATORY_CARE_PROVIDER_SITE_OTHER): Admitting: Primary Care

## 2024-04-30 VITALS — BP 132/74 | HR 59 | Temp 97.2°F | Ht 74.0 in | Wt 254.0 lb

## 2024-04-30 DIAGNOSIS — I10 Essential (primary) hypertension: Secondary | ICD-10-CM | POA: Diagnosis not present

## 2024-04-30 DIAGNOSIS — Z23 Encounter for immunization: Secondary | ICD-10-CM | POA: Diagnosis not present

## 2024-04-30 DIAGNOSIS — R600 Localized edema: Secondary | ICD-10-CM | POA: Diagnosis not present

## 2024-04-30 NOTE — Assessment & Plan Note (Signed)
 Improved  Remain off amlodipine  10 mg daily.  Continue losartan -hydrochlorothiazide  100-25 milligrams daily. BMP pending.

## 2024-04-30 NOTE — Progress Notes (Signed)
 Subjective:    Patient ID: Curtis JINNY Sheri Mickey., male    DOB: 01/27/1941, 83 y.o.   MRN: [id number]  Curtis Guevara. is a very pleasant 83 y.o. male with a history of hypertension, CKD, prostate cancer, hyperlipidemia, prediabetes who presents today for follow-up of hypertension.  He was last evaluated on 04/10/2024 for right lower extremity swelling.  During this visit he was noted to have elevated blood pressure readings, also during prior visits at that time he was managed on amlodipine  10 mg daily.  During this visit he underwent BNP and BMP which were stable/negative.  An echocardiogram was ordered and a referral to vascular services was placed.  His amlodipine  was discontinued and his losartan -hydrochlorothiazide  was increased to 100-25 mg daily.  Evaluated by vascular service on 04/22/2024.  During this visit his lower extremity edema had resolved.  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  He declined at that time but would follow back up if he experience any problems.  Today he continues to notice improvement to his lower extremity edema. He has been taking the losartan -hydrochlorothiazide  100-25 mg dose.  He discontinued his amlodipine  as instructed.  His echocardiogram is scheduled for 05/16/2024.  He is back to playing golf and usual activities.  BP Readings from Last 3 Encounters:  04/30/24 132/74  04/22/24 (!) 149/93  04/10/24 (!) 144/86       Review of Systems  Respiratory:  Negative for shortness of breath.   Cardiovascular:  Negative for chest pain and leg swelling.  Skin:  Negative for color change.         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    Neoplasm of uncertain behavior of stomach, intestines, and rectum    Prostate cancer (HCC) 2009   Psoriasis     Social History   Socioeconomic History   Marital status: Married    Spouse name: Not on file   Number of children: Not on file   Years of education: Not on file   Highest education level: Doctorate  Occupational History   Not on file  Tobacco Use   Smoking status: Former    Current packs/day: 0.00    Average packs/day: 2.0 packs/day for 15.0 years (30.0 ttl pk-yrs)    Types: Cigarettes    Start date: 66    Quit date: 7    Years since quitting: 27.6   Smokeless tobacco: Never  Vaping Use   Vaping status: Never Used  Substance and Sexual Activity   Alcohol use: Yes    Alcohol/week: 7.0 standard drinks of alcohol    Types: 7 Glasses of wine per week    Comment: occasionally   Drug use: Not Currently   Sexual activity: Not Currently  Other Topics Concern   Not on file  Social History  Narrative   Married.   No children.   Retired. Once worked for [employer].   Enjoys working on his house, playing golf, riding his bike.    Social Drivers of Corporate investment banker Strain: Low Risk  (07/04/2023)   Overall Financial Resource Strain (CARDIA)    Difficulty of Paying Living Expenses: Not very hard  Food Insecurity: No Food Insecurity (07/04/2023)   Hunger Vital Sign    Worried About Running Out of Food in the Last Year: Never true    Ran Out of Food in the Last Year: Never true  Transportation Needs: No Transportation Needs (07/04/2023)   PRAPARE - Administrator, Civil Service (Medical): No    Lack of Transportation (Non-Medical): No  Physical  Activity: Sufficiently Active (07/04/2023)   Exercise Vital Sign    Days of Exercise per Week: 3 days    Minutes of Exercise per Session: 150+ min  Stress: No Stress Concern Present (07/04/2023)   Curtis Black of Occupational Health - Occupational Stress Questionnaire    Feeling of Stress : Only a little  Social Connections: Unknown (07/04/2023)   Social Connection and Isolation Panel    Frequency of Communication with Friends and Family: More than three times a week    Frequency of Social Gatherings with Friends and Family: More than three times a week    Attends Religious Services: Patient declined    Database administrator or Organizations: Yes    Attends Engineer, structural: More than 4 times per year    Marital Status: Married  Recent Concern: Social Connections - Moderately Isolated (06/28/2023)   Social Connection and Isolation Panel    Frequency of Communication with Friends and Family: More than three times a week    Frequency of Social Gatherings with Friends and Family: Once a week    Attends Religious Services: Never    Database administrator or Organizations: No    Attends Banker Meetings: Never    Marital Status: Married  Catering manager Violence: Not At Risk (06/28/2023)   Humiliation, Afraid, Rape, and Kick questionnaire    Fear of Current or Ex-Partner: No    Emotionally Abused: No    Physically Abused: No    Sexually Abused: No    Past Surgical History:  Procedure Laterality Date   APPENDECTOMY     BOWEL RESECTION     CATARACT EXTRACTION W/PHACO Right 01/05/2021   Procedure: CATARACT EXTRACTION PHACO AND INTRAOCULAR LENS PLACEMENT (IOC) RIGHT OMIDRIA ;  Surgeon: Mittie Gaskin, MD;  Location: [facility];  Service: Ophthalmology;  Laterality: Right;  cde 5.46 01:01.7 minutes 8.9%   CATARACT EXTRACTION W/PHACO Left 01/26/2021   Procedure: CATARACT EXTRACTION PHACO AND INTRAOCULAR LENS PLACEMENT (IOC) LEFT OMIDRIA  5.35  00:50.6;  Surgeon: Mittie Gaskin, MD;  Location: [facility];  Service: Ophthalmology;  Laterality: Left;   COLON SURGERY     COLONOSCOPY N/A 12/27/2023   Procedure: COLONOSCOPY;  Surgeon: Onita Elspeth Sharper, DO;  Location: [facility] END[facility]COPY;  Service: Gastroenterology;  Laterality: N/A;   COLONOSCOPY WITH PROPOFOL  N/A 06/05/2017   Procedure: COLONOSCOPY WITH PROPOFOL ;  Surgeon: Therisa Bi, MD;  Location: [facility] ENDOSCOPY;  Service: Gastroenterology;  Laterality: N/A;   COLONOSCOPY WITH PROPOFOL  N/A 06/15/2020   Procedure: COLONOSCOPY WITH PROPOFOL ;  Surgeon: Therisa Bi, MD;  Location: [facility] ENDOSCOPY;  Service: Gastroenterology;  Laterality: N/A;   COLONOSCOPY WITH PROPOFOL  N/A 11/16/2020   Procedure: COLONOSCOPY WITH PROPOFOL ;  Surgeon: Therisa Bi, MD;  Location: [facility] ENDOSCOPY;  Service: Gastroenterology;  Laterality: N/A;   EYE SURGERY     KNEE ARTHROSCOPY Right    POLYPECTOMY  12/27/2023   Procedure: POLYPECTOMY, INTESTINE;  Surgeon: Onita Elspeth Sharper, DO;  Location: [facility] ENDOSCOPY;  Service: Gastroenterology;;   PROSTATECTOMY     thyroid  biop      Family History  Problem Relation Age of Onset   Emphysema Father    Diabetes Sister     Allergies  Allergen Reactions   Lidocaine      Cut off wind    Current Outpatient Medications on File Prior to Visit  Medication Sig Dispense Refill   apixaban  (ELIQUIS ) 2.5 MG TABS tablet Take 1 tablet (2.5 mg total) by mouth 2 (two) times daily. 180 tablet 1   atorvastatin  (LIPITOR) 40 MG tablet Take 1 tablet (40 mg total) by mouth daily. for cholesterol. 90 tablet 3   Coenzyme Q10 (COQ10 PO) Take 1 capsule by mouth daily.      GLUCOSAMINE SULFATE PO Take by mouth.     Iron -Vitamin C  65-125 MG TABS Take 1 tablet by mouth daily.     losartan -hydrochlorothiazide  (HYZAAR) 100-25 MG tablet Take 1 tablet by mouth daily. for blood pressure. 90 tablet 0   Multiple Vitamin (MULTIVITAMIN) tablet Take 1 tablet by mouth daily.      Omega-3 Fatty Acids (FISH OIL) 1000 MG CAPS Take 1,000 mg by mouth 2 (two) times daily.     sertraline  (ZOLOFT ) 50 MG tablet TAKE 1 TABLET (50 MG TOTAL) BY MOUTH DAILY. FOR ANXIETY. 90 tablet 2   triamcinolone  cream (KENALOG ) 0.1 % APPLY TWICE DAILY FOR 5 DAYS WEEKLY AS NEEDED TO AFFECTED AREAS OF BODY FOR PSORIASIS. AVOID APPLYING TO FACE, GROIN & UNDERARMS. USE AS DIRECTED. 80 g 2   cetirizine (ZYRTEC) 10 MG tablet Take 10 mg by mouth daily. (Patient not taking: Reported on 04/30/2024)     No current facility-administered medications on file prior to visit.    BP 132/74   Pulse (!) 59   Temp (!) 97.2 F (36.2 C) (Temporal)   Ht 6' 2 (1.88 m)   Wt 254 lb (115.2 kg)   SpO2 94%   BMI 32.61 kg/m  Objective:   Physical Exam Cardiovascular:     Rate and Rhythm: Normal rate and regular rhythm.  Pulmonary:     Effort: Pulmonary effort is normal.     Breath sounds: Normal breath sounds.  Musculoskeletal:     Cervical back: Neck supple.  Skin:    General: Skin is warm and dry.  Neurological:     Mental Status: He is alert and oriented to person, place, and time.  Psychiatric:        Mood and Affect: Mood normal.     Physical Exam        Assessment & Plan:  Encounter for immunization -     Flu vaccine HIGH DOSE PF(Fluzone Trivalent)  Essential hypertension Assessment & Plan: Improved  Remain off amlodipine  10 mg daily.  Continue losartan -hydrochlorothiazide  100-25 milligrams daily. BMP pending.  Orders: -     Basic metabolic panel with GFR  Bilateral lower extremity edema Assessment & Plan: Nearly resolved!  Remain off calcium  channel blocker which was suspected to be the cause. Reviewed vascular surgery notes from August 2025  Proceed with echocardiogram as scheduled     Assessment and Plan Assessment & Plan         Comer MARLA Gaskins, NP    Discussed the use  of AI scribe software for clinical note transcription with the patient, who gave  verbal consent to proceed.  History of Present Illness

## 2024-04-30 NOTE — Assessment & Plan Note (Addendum)
 Nearly resolved!  Remain off calcium  channel blocker which was suspected to be the cause. Reviewed vascular surgery notes from August 2025  Proceed with echocardiogram as scheduled

## 2024-04-30 NOTE — Patient Instructions (Addendum)
 Stop by the lab prior to leaving today. I will notify you of your results once received.   It was a pleasure to see you today!

## 2024-05-01 ENCOUNTER — Ambulatory Visit: Payer: Self-pay | Admitting: Primary Care

## 2024-05-01 DIAGNOSIS — I10 Essential (primary) hypertension: Secondary | ICD-10-CM

## 2024-05-01 LAB — BASIC METABOLIC PANEL WITH GFR
BUN: 30 mg/dL — ABNORMAL HIGH (ref 6–23)
CO2: 28 meq/L (ref 19–32)
Calcium: 9.6 mg/dL (ref 8.4–10.5)
Chloride: 102 meq/L (ref 96–112)
Creatinine, Ser: 1.73 mg/dL — ABNORMAL HIGH (ref 0.40–1.50)
GFR: 36.64 mL/min — ABNORMAL LOW (ref >60.00)
Glucose, Bld: 116 mg/dL — ABNORMAL HIGH (ref 70–99)
Potassium: 3.5 meq/L (ref 3.5–5.1)
Sodium: 144 meq/L (ref 135–145)

## 2024-05-15 ENCOUNTER — Ambulatory Visit: Admitting: Dermatology

## 2024-05-15 DIAGNOSIS — D229 Melanocytic nevi, unspecified: Secondary | ICD-10-CM

## 2024-05-15 DIAGNOSIS — Z85828 Personal history of other malignant neoplasm of skin: Secondary | ICD-10-CM | POA: Diagnosis not present

## 2024-05-15 DIAGNOSIS — Z1283 Encounter for screening for malignant neoplasm of skin: Secondary | ICD-10-CM

## 2024-05-15 DIAGNOSIS — D692 Other nonthrombocytopenic purpura: Secondary | ICD-10-CM | POA: Diagnosis not present

## 2024-05-15 DIAGNOSIS — L578 Other skin changes due to chronic exposure to nonionizing radiation: Secondary | ICD-10-CM | POA: Diagnosis not present

## 2024-05-15 DIAGNOSIS — D1801 Hemangioma of skin and subcutaneous tissue: Secondary | ICD-10-CM

## 2024-05-15 DIAGNOSIS — L820 Inflamed seborrheic keratosis: Secondary | ICD-10-CM | POA: Diagnosis not present

## 2024-05-15 DIAGNOSIS — W908XXA Exposure to other nonionizing radiation, initial encounter: Secondary | ICD-10-CM | POA: Diagnosis not present

## 2024-05-15 DIAGNOSIS — L720 Epidermal cyst: Secondary | ICD-10-CM

## 2024-05-15 DIAGNOSIS — L814 Other melanin hyperpigmentation: Secondary | ICD-10-CM | POA: Diagnosis not present

## 2024-05-15 DIAGNOSIS — L821 Other seborrheic keratosis: Secondary | ICD-10-CM | POA: Diagnosis not present

## 2024-05-15 DIAGNOSIS — L729 Follicular cyst of the skin and subcutaneous tissue, unspecified: Secondary | ICD-10-CM

## 2024-05-15 DIAGNOSIS — L570 Actinic keratosis: Secondary | ICD-10-CM

## 2024-05-15 NOTE — Patient Instructions (Signed)
 Recommend daily broad spectrum sunscreen SPF 30+ to sun-exposed areas, reapply every 2 hours as needed. Call for new or changing lesions.  Staying in the shade or wearing long sleeves, sun glasses (UVA+UVB protection) and wide brim hats (4-inch brim around the entire circumference of the hat) are also recommended for sun protection.     Melanoma ABCDEs  Melanoma is the most dangerous type of skin cancer, and is the leading cause of death from skin disease.  You are more likely to develop melanoma if you: Have light-colored skin, light-colored eyes, or red or blond hair Spend a lot of time in the sun Tan regularly, either outdoors or in a tanning bed Have had blistering sunburns, especially during childhood Have a close family member who has had a melanoma Have atypical moles or large birthmarks  Early detection of melanoma is key since treatment is typically straightforward and cure rates are extremely high if we catch it early.   The first sign of melanoma is often a change in a mole or a new dark spot.  The ABCDE system is a way of remembering the signs of melanoma.  A for asymmetry:  The two halves do not match. B for border:  The edges of the growth are irregular. C for color:  A mixture of colors are present instead of an even brown color. D for diameter:  Melanomas are usually (but not always) greater than 6mm - the size of a pencil eraser. E for evolution:  The spot keeps changing in size, shape, and color.  Please check your skin once per month between visits. You can use a small mirror in front and a large mirror behind you to keep an eye on the back side or your body.   If you see any new or changing lesions before your next follow-up, please call to schedule a visit.  Please continue daily skin protection including broad spectrum sunscreen SPF 30+ to sun-exposed areas, reapplying every 2 hours as needed when you're outdoors.    Due to recent changes in healthcare laws, you  may see results of your pathology and/or laboratory studies on MyChart before the doctors have had a chance to review them. We understand that in some cases there may be results that are confusing or concerning to you. Please understand that not all results are received at the same time and often the doctors may need to interpret multiple results in order to provide you with the best plan of care or course of treatment. Therefore, we ask that you please give us  2 business days to thoroughly review all your results before contacting the office for clarification. Should we see a critical lab result, you will be contacted sooner.   If You Need Anything After Your Visit  If you have any questions or concerns for your doctor, please call our main line at [phone] and press option 4 to reach your doctor's medical assistant. If no one answers, please leave a voicemail as directed and we will return your call as soon as possible. Messages left after 4 pm will be answered the following business day.   You may also send us  a message via MyChart. We typically respond to MyChart messages within 1-2 business days.  For prescription refills, please ask your pharmacy to contact our office. Our fax number is [phone].  If you have an urgent issue when the clinic is closed that cannot wait until the next business day, you can page your doctor at  the number below.    Please note that while we do our best to be available for urgent issues outside of office hours, we are not available 24/7.   If you have an urgent issue and are unable to reach us , you may choose to seek medical care at your doctor's office, retail clinic, urgent care center, or emergency room.  If you have a medical emergency, please immediately call 911 or go to the emergency department.  Pager Numbers  - Dr. Hester: [phone]  - Dr. Jackquline: [phone]  - Dr. Claudene: [phone]   - Dr. Raymund: [phone]  In the event of  inclement weather, please call our main line at [phone] for an update on the status of any delays or closures.  Dermatology Medication Tips: Please keep the boxes that topical medications come in in order to help keep track of the instructions about where and how to use these. Pharmacies typically print the medication instructions only on the boxes and not directly on the medication tubes.   If your medication is too expensive, please contact our office at [phone] option 4 or send us  a message through MyChart.   We are unable to tell what your co-pay for medications will be in advance as this is different depending on your insurance coverage. However, we may be able to find a substitute medication at lower cost or fill out paperwork to get insurance to cover a needed medication.   If a prior authorization is required to get your medication covered by your insurance company, please allow us  1-2 business days to complete this process.  Drug prices often vary depending on where the prescription is filled and some pharmacies may offer cheaper prices.  The website www.goodrx.com contains coupons for medications through different pharmacies. The prices here do not account for what the cost may be with help from insurance (it may be cheaper with your insurance), but the website can give you the price if you did not use any insurance.  - You can print the associated coupon and take it with your prescription to the pharmacy.  - You may also stop by our office during regular business hours and pick up a GoodRx coupon card.  - If you need your prescription sent electronically to a different pharmacy, notify our office through [facility] or by phone at [phone] option 4.     Si Usted Necesita Algo Despus de Su Visita  Tambin puede enviarnos un mensaje a travs de Clinical cytogeneticist. Por lo general respondemos a los mensajes de MyChart en el transcurso de 1 a 2 das hbiles.  Para renovar  recetas, por favor pida a su farmacia que se ponga en contacto con nuestra oficina. Randi lakes de fax es [place] [phone].  Si tiene un asunto urgente cuando la clnica est cerrada y que no puede esperar hasta el siguiente da hbil, puede llamar/localizar a su doctor(a) al nmero que aparece a continuacin.   Por favor, tenga en cuenta que aunque hacemos todo lo posible para estar disponibles para asuntos urgentes fuera del horario de [place], no estamos disponibles las 24 horas del da, los 7 [address]  [address] de la [place].   Si tiene un problema urgente y no puede comunicarse con nosotros, puede optar por buscar atencin mdica  en el consultorio de su doctor(a), en una clnica privada, en un centro de atencin urgente o en una sala de emergencias.  Si tiene una emergencia mdica, por favor llame inmediatamente al 911 o vaya a  la sala de emergencias.  Nmeros de bper  - Dr. Hester: [phone]  - Dra. Jackquline: [phone]  - Dr. Claudene: [phone]  - Dra. Kitts: [phone]  En caso de inclemencias del [place], por favor llame a nuestra lnea principal al [phone] para una actualizacin sobre el estado de cualquier retraso o cierre.  Consejos para la medicacin en dermatologa: Por favor, guarde las cajas en las que vienen los medicamentos de uso tpico para ayudarle a seguir las instrucciones sobre dnde y cmo usarlos. Las farmacias generalmente imprimen las instrucciones del medicamento slo en las cajas y no directamente en los tubos del [place].   Si su medicamento es muy caro, por favor, pngase en contacto con landry rieger llamando al [phone] y presione la opcin 4 o envenos un mensaje a travs de Clinical cytogeneticist.   No podemos decirle cul ser su copago por los medicamentos por adelantado ya que esto es diferente dependiendo de la cobertura de su seguro. Sin embargo, es posible que podamos encontrar un medicamento sustituto a Audiological scientist un formulario para que el  seguro cubra el medicamento que se considera necesario.   Si se requiere una autorizacin previa para que su compaa de seguros malta su medicamento, por favor permtanos de 1 a 2 das hbiles para completar este proceso.  Los precios de los medicamentos varan con frecuencia dependiendo del Environmental consultant de dnde se surte la receta y alguna farmacias pueden ofrecer precios ms baratos.  El sitio web www.goodrx.com tiene cupones para medicamentos de Health and safety inspector. Los precios aqu no tienen en cuenta lo que podra costar con la ayuda del seguro (puede ser ms barato con su seguro), pero el sitio web puede darle el precio si no utiliz Tourist information centre manager.  - Puede imprimir el cupn correspondiente y llevarlo con su receta a la farmacia.  - Tambin puede pasar por nuestra oficina durante el horario de atencin regular y Education officer, museum una tarjeta de cupones de GoodRx.  - Si necesita que su receta se enve electrnicamente a una farmacia diferente, informe a nuestra oficina a travs de MyChart de [place] o por telfono llamando al [phone] y presione la opcin 4.

## 2024-05-15 NOTE — Progress Notes (Unsigned)
 Follow-Up Visit   Subjective  Curtis Black is a 83 y.o. male who presents for the following: Skin Cancer Screening and Full Body Skin Exam; hx of BCC.   The patient presents for Total-Body Skin Exam (TBSE) for skin cancer screening and mole check. The patient has spots, moles and lesions to be evaluated, some may be new or changing and the patient may have concern these could be cancer.  The following portions of the chart were reviewed this encounter and updated as appropriate: medications, allergies, medical history  Review of Systems:  No other skin or systemic complaints except as noted in HPI or Assessment and Plan.  Objective  Well appearing patient in no apparent distress; mood and affect are within normal limits.  A full examination was performed including scalp, head, eyes, ears, nose, lips, neck, chest, axillae, abdomen, back, buttocks, bilateral upper extremities, bilateral lower extremities, hands, feet, fingers, toes, fingernails, and toenails. All findings within normal limits unless otherwise noted below.   Relevant physical exam findings are noted in the Assessment and Plan.  Face x2 (2) Pink scaly macules Face x10, back x7 (17) Stuck on waxy paps with erythema  Assessment & Plan   SKIN CANCER SCREENING PERFORMED TODAY.  ACTINIC DAMAGE - Chronic condition, secondary to cumulative UV/sun exposure - diffuse scaly erythematous macules with underlying dyspigmentation - Recommend daily broad spectrum sunscreen SPF 30+ to sun-exposed areas, reapply every 2 hours as needed.  - Staying in the shade or wearing long sleeves, sun glasses (UVA+UVB protection) and wide brim hats (4-inch brim around the entire circumference of the hat) are also recommended for sun protection.  - Call for new or changing lesions.  LENTIGINES, SEBORRHEIC KERATOSES, HEMANGIOMAS - Benign normal skin lesions - Benign-appearing - Call for any changes  MELANOCYTIC NEVI - Tan-brown and/or  pink-flesh-colored symmetric macules and papules - Benign appearing on exam today - Observation - Call clinic for new or changing moles - Recommend daily use of broad spectrum spf 30+ sunscreen to sun-exposed areas.   HISTORY OF BASAL CELL CARCINOMA OF THE SKIN - No evidence of recurrence today - Recommend regular full body skin exams - Recommend daily broad spectrum sunscreen SPF 30+ to sun-exposed areas, reapply every 2 hours as needed.  - Call if any new or changing lesions are noted between office visits  Purpura - Chronic; persistent and recurrent.  Treatable, but not curable. - Violaceous macules and patches at bilateral arms - Benign - Related to trauma, age, sun damage and/or use of blood thinners, chronic use of topical and/or oral steroids - Observe - Can use OTC arnica containing moisturizer such as Dermend Bruise Formula if desired - Call for worsening or other concerns  EPIDERMAL INCLUSION CYST Exam: 1cm Subcutaneous nodule at paraspinal region  Benign-appearing. Exam most consistent with an epidermal inclusion cyst. Discussed that a cyst is a benign growth that can grow over time and sometimes get irritated or inflamed. Recommend observation if it is not bothersome. Discussed option of surgical excision to remove it if it is growing, symptomatic, or other changes noted. Please call for new or changing lesions so they can be evaluated.  ACTINIC KERATOSIS (2) Face x2 (2) Actinic keratoses are precancerous spots that appear secondary to cumulative UV radiation exposure/sun exposure over time. They are chronic with expected duration over 1 year. A portion of actinic keratoses will progress to squamous cell carcinoma of the skin. It is not possible to reliably predict which spots will progress to skin cancer  and so treatment is recommended to prevent development of skin cancer.  Recommend daily broad spectrum sunscreen SPF 30+ to sun-exposed areas, reapply every 2 hours as  needed.  Recommend staying in the shade or wearing long sleeves, sun glasses (UVA+UVB protection) and wide brim hats (4-inch brim around the entire circumference of the hat). Call for new or changing lesions. Destruction of lesion - Face x2 (2) Complexity: simple   Destruction method: cryotherapy   Informed consent: discussed and consent obtained   Timeout:  patient name, date of birth, surgical site, and procedure verified Lesion destroyed using liquid nitrogen: Yes   Region frozen until ice ball extended beyond lesion: Yes   Outcome: patient tolerated procedure well with no complications   Post-procedure details: wound care instructions given   Additional details:  Prior to procedure, discussed risks of blister formation, small wound, skin dyspigmentation, or rare scar following cryotherapy. Recommend Vaseline ointment to treated areas while healing.   INFLAMED SEBORRHEIC KERATOSIS (17) Face x10, back x7 (17) Symptomatic, irritating, patient would like treated. Destruction of lesion - Face x10, back x7 (17) Complexity: simple   Destruction method: cryotherapy   Informed consent: discussed and consent obtained   Timeout:  patient name, date of birth, surgical site, and procedure verified Lesion destroyed using liquid nitrogen: Yes   Region frozen until ice ball extended beyond lesion: Yes   Outcome: patient tolerated procedure well with no complications   Post-procedure details: wound care instructions given   Additional details:  Prior to procedure, discussed risks of blister formation, small wound, skin dyspigmentation, or rare scar following cryotherapy. Recommend Vaseline ointment to treated areas while healing.   Return in about 1 year (around 05/15/2025) for TBSE.  I, Curtis Black, CMA am acting as scribe for Curtis Rhyme, MD.   Documentation: I have reviewed the above documentation for accuracy and completeness, and I agree with the above.  Curtis Rhyme, MD

## 2024-05-16 ENCOUNTER — Encounter: Payer: Self-pay | Admitting: Dermatology

## 2024-05-16 ENCOUNTER — Ambulatory Visit
Admission: RE | Admit: 2024-05-16 | Discharge: 2024-05-16 | Disposition: A | Discharge status: Home / Self Care | Source: Ambulatory Visit | Attending: Primary Care | Admitting: Primary Care

## 2024-05-16 DIAGNOSIS — R600 Localized edema: Secondary | ICD-10-CM | POA: Insufficient documentation

## 2024-05-16 DIAGNOSIS — R053 Chronic cough: Secondary | ICD-10-CM | POA: Diagnosis not present

## 2024-05-20 ENCOUNTER — Ambulatory Visit: Admitting: Primary Care

## 2024-05-20 ENCOUNTER — Ambulatory Visit: Payer: Self-pay | Admitting: Primary Care

## 2024-05-20 ENCOUNTER — Other Ambulatory Visit (INDEPENDENT_AMBULATORY_CARE_PROVIDER_SITE_OTHER)

## 2024-05-20 DIAGNOSIS — I10 Essential (primary) hypertension: Secondary | ICD-10-CM

## 2024-05-20 LAB — BASIC METABOLIC PANEL WITH GFR
BUN: 32 mg/dL — ABNORMAL HIGH (ref 6–23)
CO2: 28 meq/L (ref 19–32)
Calcium: 10.4 mg/dL (ref 8.4–10.5)
Chloride: 103 meq/L (ref 96–112)
Creatinine, Ser: 1.44 mg/dL (ref 0.40–1.50)
GFR: 45.65 mL/min — ABNORMAL LOW (ref >60.00)
Glucose, Bld: 92 mg/dL (ref 70–99)
Potassium: 3.4 meq/L — ABNORMAL LOW (ref 3.5–5.1)
Sodium: 141 meq/L (ref 135–145)

## 2024-05-22 ENCOUNTER — Ambulatory Visit: Payer: Medicare Other | Admitting: Dermatology

## 2024-05-23 ENCOUNTER — Ambulatory Visit
Admission: RE | Admit: 2024-05-23 | Discharge: 2024-05-23 | Disposition: A | Discharge status: Home / Self Care | Source: Ambulatory Visit | Attending: Primary Care | Admitting: Primary Care

## 2024-05-23 DIAGNOSIS — R053 Chronic cough: Secondary | ICD-10-CM | POA: Insufficient documentation

## 2024-05-23 DIAGNOSIS — R600 Localized edema: Secondary | ICD-10-CM | POA: Diagnosis not present

## 2024-05-23 DIAGNOSIS — E785 Hyperlipidemia, unspecified: Secondary | ICD-10-CM | POA: Diagnosis not present

## 2024-05-23 DIAGNOSIS — I10 Essential (primary) hypertension: Secondary | ICD-10-CM | POA: Insufficient documentation

## 2024-05-23 LAB — ECHOCARDIOGRAM COMPLETE
AR max vel: 1.37 cm2
AV Area VTI: 1.65 cm2
AV Area mean vel: 1.4 cm2
AV Mean grad: 7.7 mmHg
AV Peak grad: 14.6 mmHg
Ao pk vel: 1.91 m/s
Area-P 1/2: 1.5 cm2
S' Lateral: 3 cm

## 2024-05-23 NOTE — Progress Notes (Signed)
*  PRELIMINARY RESULTS* Echocardiogram 2D Echocardiogram has been performed.  Curtis Black 05/23/2024, 10:42 AM

## 2024-05-28 ENCOUNTER — Other Ambulatory Visit: Payer: Self-pay | Admitting: Primary Care

## 2024-05-28 DIAGNOSIS — E785 Hyperlipidemia, unspecified: Secondary | ICD-10-CM

## 2024-06-24 ENCOUNTER — Other Ambulatory Visit: Payer: Self-pay | Admitting: Primary Care

## 2024-06-24 DIAGNOSIS — I10 Essential (primary) hypertension: Secondary | ICD-10-CM

## 2024-07-04 DIAGNOSIS — M1711 Unilateral primary osteoarthritis, right knee: Secondary | ICD-10-CM | POA: Diagnosis not present

## 2024-07-04 DIAGNOSIS — M1611 Unilateral primary osteoarthritis, right hip: Secondary | ICD-10-CM | POA: Diagnosis not present

## 2024-07-15 ENCOUNTER — Encounter: Payer: Self-pay | Admitting: Primary Care

## 2024-07-15 ENCOUNTER — Ambulatory Visit: Payer: Medicare Other | Admitting: Primary Care

## 2024-07-15 VITALS — BP 130/84 | HR 56 | Temp 97.8°F | Ht 73.5 in | Wt 243.1 lb

## 2024-07-15 DIAGNOSIS — I10 Essential (primary) hypertension: Secondary | ICD-10-CM

## 2024-07-15 DIAGNOSIS — Z8546 Personal history of malignant neoplasm of prostate: Secondary | ICD-10-CM | POA: Diagnosis not present

## 2024-07-15 DIAGNOSIS — N1831 Chronic kidney disease, stage 3a: Secondary | ICD-10-CM | POA: Diagnosis not present

## 2024-07-15 DIAGNOSIS — F411 Generalized anxiety disorder: Secondary | ICD-10-CM

## 2024-07-15 DIAGNOSIS — R7303 Prediabetes: Secondary | ICD-10-CM

## 2024-07-15 DIAGNOSIS — E785 Hyperlipidemia, unspecified: Secondary | ICD-10-CM | POA: Diagnosis not present

## 2024-07-15 DIAGNOSIS — Z125 Encounter for screening for malignant neoplasm of prostate: Secondary | ICD-10-CM

## 2024-07-15 DIAGNOSIS — Z86718 Personal history of other venous thrombosis and embolism: Secondary | ICD-10-CM

## 2024-07-15 LAB — PSA, MEDICARE: PSA: 0 ng/mL — ABNORMAL LOW (ref 0.10–4.00)

## 2024-07-15 LAB — HEPATIC FUNCTION PANEL
ALT: 36 U/L (ref 0–53)
AST: 32 U/L (ref 0–37)
Albumin: 4.5 g/dL (ref 3.5–5.2)
Alkaline Phosphatase: 54 U/L (ref 39–117)
Bilirubin, Direct: 0.2 mg/dL (ref 0.0–0.3)
Total Bilirubin: 1.3 mg/dL — ABNORMAL HIGH (ref 0.2–1.2)
Total Protein: 7.1 g/dL (ref 6.0–8.3)

## 2024-07-15 LAB — HEMOGLOBIN A1C: Hgb A1c MFr Bld: 6 % (ref 4.6–6.5)

## 2024-07-15 MED ORDER — SERTRALINE HCL 100 MG PO TABS: 100.0000 mg | ORAL_TABLET | Freq: Every day | ORAL | 3 refills | Status: AC

## 2024-07-15 NOTE — Assessment & Plan Note (Signed)
 Following with hematology, labs reviewed from September 2025  Continue apixaban  2.5 mg twice daily

## 2024-07-15 NOTE — Assessment & Plan Note (Signed)
 Repeat lipid panel pending.  Continue atorvastatin  40 mg daily, fish oil 1000 mg twice daily

## 2024-07-15 NOTE — Assessment & Plan Note (Signed)
 In remission for years  Repeat PSA level pending

## 2024-07-15 NOTE — Assessment & Plan Note (Signed)
 Controlled!  Continue losartan -hydrochlorothiazide  100-25 mg daily. BMP reviewed from September 2025

## 2024-07-15 NOTE — Assessment & Plan Note (Signed)
 Stable Reviewed renal function from September 2025 per oncology

## 2024-07-15 NOTE — Assessment & Plan Note (Signed)
 Repeat A1c pending

## 2024-07-15 NOTE — Progress Notes (Signed)
 Subjective:    Patient ID: Curtis JINNY Sheri Mickey., male    DOB: 1941/05/11, 83 y.o.   MRN: [id number]  Curtis Harbor. is a very pleasant 83 y.o. male with a history of hypertension, CKD, DVT, prostate cancer, GAD, hyperlipidemia, prediabetes who presents today for follow-up of chronic conditions.  1) CAD/Hypertension/Hyperlipidemia: Currently managed on atorvastatin  40 mg daily, losartan -hydrochlorothiazide  100-25 mg daily.  He denies chest pain, shortness of breath, dizziness, headaches.   BP Readings from Last 3 Encounters:  07/15/24 130/84  04/30/24 132/74  04/22/24 (!) 149/93     2) History of DVT: History of postthrombotic syndrome of right lower extremity and is managed on apixaban  2.5 mg twice daily.  Following with vascular surgery, last office visit was 04/22/2024 for follow-up. During this visit it was recommended he undergo follow-up venous ultrasound of right lower extremity. He kindly declined for the time being.   He denies lower extremity edema, lower extremity pain.  He is doing much better since removal of amlodipine  from his regimen  3) GAD: Currently managed on sertraline  50 mg daily. Historically, he believes Zoloft  has been effective. Over the last several months he's under increased stress as he's the caregiver of his sister and his wife who recently had surgery. More recently his dog has been sick.    He is inquiring about increasing his Zoloft  dose   Review of Systems  Respiratory:  Negative for shortness of breath.   Cardiovascular:  Negative for chest pain.  Gastrointestinal:  Negative for constipation and diarrhea.  Genitourinary:  Negative for difficulty urinating.  Neurological:  Negative for dizziness and facial asymmetry.  Psychiatric/Behavioral:  The patient is nervous/anxious.          Past Medical History:  Diagnosis Date   Actinic keratosis    Anxiety    Basal cell carcinoma 03/06/2017   left medial infraorbital lat nose inf to med  canthus   Basal cell carcinoma 06/12/2019   sup to glabella   Basal cell carcinoma 12/15/2021   superior to glabella,  Mohs 03/28/2022   Basal cell carcinoma (BCC) of nostril 06/12/2019   Removed, no other intervention needed per pt.   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    Neoplasm of uncertain behavior of stomach, intestines, and rectum    Prostate cancer (HCC) 2009   Psoriasis     Social History   Socioeconomic History   Marital status: Married    Spouse name: Not on file   Number of children: Not on file   Years of education: Not on file   Highest education level: Doctorate  Occupational History   Not on file  Tobacco Use   Smoking status: Former    Current packs/day: 0.00    Average packs/day: 2.0 packs/day for 15.0 years (30.0 ttl pk-yrs)    Types: Cigarettes    Start date: 42    Quit date: 23    Years since quitting: 27.8   Smokeless tobacco: Never  Vaping Use   Vaping status: Never Used  Substance and Sexual Activity   Alcohol use: Yes    Alcohol/week: 7.0 standard drinks of alcohol    Types: 7 Glasses of wine per week  Comment: occasionally   Drug use: Not Currently   Sexual activity: Not Currently  Other Topics Concern   Not on file  Social History Narrative   Married.   No children.   Retired. Once worked for [employer].   Enjoys working on his house, playing golf, riding his bike.    Social Drivers of Corporate Investment Banker Strain: Low Risk  (07/04/2023)   Overall Financial Resource Strain (CARDIA)    Difficulty of Paying Living Expenses: Not very hard  Food Insecurity: No Food Insecurity (07/04/2023)   Hunger Vital Sign    Worried About Running Out of Food in the Last Year:  Never true    Ran Out of Food in the Last Year: Never true  Transportation Needs: No Transportation Needs (07/04/2023)   PRAPARE - Administrator, Civil Service (Medical): No    Lack of Transportation (Non-Medical): No  Physical Activity: Sufficiently Active (07/04/2023)   Exercise Vital Sign    Days of Exercise per Week: 3 days    Minutes of Exercise per Session: 150+ min  Stress: No Stress Concern Present (07/04/2023)   [employer] of Occupational Health - Occupational Stress Questionnaire    Feeling of Stress : Only a little  Social Connections: Unknown (07/04/2023)   Social Connection and Isolation Panel    Frequency of Communication with Friends and Family: More than three times a week    Frequency of Social Gatherings with Friends and Family: More than three times a week    Attends Religious Services: Patient declined    Database Administrator or Organizations: Yes    Attends Engineer, Structural: More than 4 times per year    Marital Status: Married  Recent Concern: Social Connections - Moderately Isolated (06/28/2023)   Social Connection and Isolation Panel    Frequency of Communication with Friends and Family: More than three times a week    Frequency of Social Gatherings with Friends and Family: Once a week    Attends Religious Services: Never    Database Administrator or Organizations: No    Attends Banker Meetings: Never    Marital Status: Married  Catering Manager Violence: Not At Risk (06/28/2023)   Humiliation, Afraid, Rape, and Kick questionnaire    Fear of Current or Ex-Partner: No    Emotionally Abused: No    Physically Abused: No    Sexually Abused: No    Past Surgical History:  Procedure Laterality Date   APPENDECTOMY  2006   BOWEL RESECTION     CATARACT EXTRACTION W/PHACO Right 01/05/2021   Procedure: CATARACT EXTRACTION PHACO AND INTRAOCULAR LENS PLACEMENT (IOC) RIGHT OMIDRIA ;  Surgeon: Mittie Gaskin, MD;   Location: [facility];  Service: Ophthalmology;  Laterality: Right;  cde 5.46 01:01.7 minutes 8.9%   CATARACT EXTRACTION W/PHACO Left 01/26/2021   Procedure: CATARACT EXTRACTION PHACO AND INTRAOCULAR LENS PLACEMENT (IOC) LEFT OMIDRIA  5.35 00:50.6;  Surgeon: Mittie Gaskin, MD;  Location: [facility];  Service: Ophthalmology;  Laterality: Left;   COLON SURGERY  2015   COLONOSCOPY N/A 12/27/2023   Procedure: COLONOSCOPY;  Surgeon: Onita Elspeth Sharper, DO;  Location: [facility] ENDOSCOPY;  Service: Gastroenterology;  Laterality: N/A;   COLONOSCOPY WITH PROPOFOL  N/A 06/05/2017   Procedure: COLONOSCOPY WITH PROPOFOL ;  Surgeon: Therisa Bi, MD;  Location: [facility] ENDOSCOPY;  Service: Gastroenterology;  Laterality: N/A;   COLONOSCOPY WITH PROPOFOL  N/A 06/15/2020   Procedure: COLONOSCOPY WITH PROPOFOL ;  Surgeon: Therisa Bi, MD;  Location:  [facility] ENDOSCOPY;  Service: Gastroenterology;  Laterality: N/A;   COLONOSCOPY WITH PROPOFOL  N/A 11/16/2020   Procedure: COLONOSCOPY WITH PROPOFOL ;  Surgeon: Therisa Bi, MD;  Location: [facility] ENDOSCOPY;  Service: Gastroenterology;  Laterality: N/A;   EYE SURGERY     KNEE ARTHROSCOPY Right    POLYPECTOMY  12/27/2023   Procedure: POLYPECTOMY, INTESTINE;  Surgeon: Onita Elspeth Sharper, DO;  Location: [facility] ENDOSCOPY;  Service: Gastroenterology;;   PROSTATECTOMY     thyroid  biop      Family History  Problem Relation Age of Onset   Emphysema Father    Diabetes Sister     Allergies  Allergen Reactions   Lidocaine      Cut off wind    Current Outpatient Medications on File Prior to Visit  Medication Sig Dispense Refill   apixaban  (ELIQUIS ) 2.5 MG TABS tablet Take 1 tablet (2.5 mg total) by mouth 2 (two) times daily. 180 tablet 1   atorvastatin  (LIPITOR) 40 MG tablet TAKE 1 TABLET DAILY FOR    CHOLESTEROL 90 tablet 0   Coenzyme Q10 (COQ10 PO) Take 1 capsule by mouth daily.      GLUCOSAMINE SULFATE PO Take by mouth.     Iron -Vitamin C  65-125 MG  TABS Take 1 tablet by mouth daily.     losartan -hydrochlorothiazide  (HYZAAR) 100-25 MG tablet TAKE 1 TABLET DAILY FOR    BLOOD PRESSURE 90 tablet 0   Multiple Vitamin (MULTIVITAMIN) tablet Take 1 tablet by mouth daily.     Omega-3 Fatty Acids (FISH OIL) 1000 MG CAPS Take 1,000 mg by mouth 2 (two) times daily.     No current facility-administered medications on file prior to visit.    BP 130/84   Pulse (!) 56   Temp 97.8 F (36.6 C) (Oral)   Ht 6' 1.5 (1.867 m)   Wt 243 lb 2 oz (110.3 kg)   SpO2 96%   BMI 31.64 kg/m  Objective:   Physical Exam Cardiovascular:     Rate and Rhythm: Normal rate and regular rhythm.  Pulmonary:     Effort: Pulmonary effort is normal.     Breath sounds: Normal breath sounds.  Musculoskeletal:     Cervical back: Neck supple.  Skin:    General: Skin is warm and dry.  Neurological:     Mental Status: He is alert and oriented to person, place, and time.  Psychiatric:        Mood and Affect: Mood normal.     Physical Exam        Assessment & Plan:  Essential hypertension Assessment & Plan: Controlled!  Continue losartan -hydrochlorothiazide  100-25 mg daily. BMP reviewed from September 2025   Hyperlipidemia, unspecified hyperlipidemia type Assessment & Plan: Repeat lipid panel pending.  Continue atorvastatin  40 mg daily, fish oil 1000 mg twice daily  Orders: -     Hepatic function panel  Prediabetes Assessment & Plan: Repeat A1c pending  Orders: -     Hemoglobin A1c  History of prostate cancer Assessment & Plan: In remission for years  Repeat PSA level pending   GAD (generalized anxiety disorder) Assessment & Plan: Deteriorated due to recent stress  Increase Zoloft  to 100 mg daily.  He agrees. He will update.  Orders: -     Sertraline  HCl; Take 1 tablet (100 mg total) by mouth daily. for anxiety and depression.  Dispense: 90 tablet; Refill: 3  Screening for prostate cancer -     PSA, Medicare  Stage 3a chronic  kidney disease (HCC) Assessment &  Plan: Stable Reviewed renal function from September 2025 per oncology   History of DVT (deep vein thrombosis) Assessment & Plan: Following with hematology, labs reviewed from September 2025  Continue apixaban  2.5 mg twice daily     Assessment and Plan Assessment & Plan         Comer MARLA Gaskins, NP     History of Present Illness

## 2024-07-15 NOTE — Patient Instructions (Signed)
 Stop by the lab prior to leaving today. I will notify you of your results once received.   We increased your dose of sertraline  to 100 mg for anxiety/depression.   It was a pleasure to see you today!

## 2024-07-15 NOTE — Assessment & Plan Note (Signed)
 Deteriorated due to recent stress  Increase Zoloft  to 100 mg daily.  He agrees. He will update.

## 2024-07-16 ENCOUNTER — Ambulatory Visit: Payer: Self-pay | Admitting: Primary Care

## 2024-07-16 DIAGNOSIS — E785 Hyperlipidemia, unspecified: Secondary | ICD-10-CM

## 2024-07-17 ENCOUNTER — Other Ambulatory Visit

## 2024-07-17 DIAGNOSIS — E785 Hyperlipidemia, unspecified: Secondary | ICD-10-CM

## 2024-07-17 LAB — LIPID PANEL
Cholesterol: 164 mg/dL (ref 0–200)
HDL: 38 mg/dL — ABNORMAL LOW (ref >39.00)
LDL Cholesterol: 78 mg/dL (ref 0–99)
NonHDL: 125.6
Total CHOL/HDL Ratio: 4
Triglycerides: 237 mg/dL — ABNORMAL HIGH (ref 0.0–149.0)
VLDL: 47.4 mg/dL — ABNORMAL HIGH (ref 0.0–40.0)

## 2024-07-19 ENCOUNTER — Ambulatory Visit: Payer: Self-pay | Admitting: Primary Care

## 2024-07-31 ENCOUNTER — Encounter: Payer: Self-pay | Admitting: Oncology

## 2024-07-31 ENCOUNTER — Inpatient Hospital Stay: Attending: Oncology

## 2024-07-31 ENCOUNTER — Inpatient Hospital Stay: Admitting: Oncology

## 2024-07-31 VITALS — BP 145/88 | HR 55 | Temp 97.6°F | Resp 17 | Wt 244.9 lb

## 2024-07-31 DIAGNOSIS — Z800 Family history of malignant neoplasm of digestive organs: Secondary | ICD-10-CM | POA: Insufficient documentation

## 2024-07-31 DIAGNOSIS — I129 Hypertensive chronic kidney disease with stage 1 through stage 4 chronic kidney disease, or unspecified chronic kidney disease: Secondary | ICD-10-CM | POA: Diagnosis not present

## 2024-07-31 DIAGNOSIS — Z79899 Other long term (current) drug therapy: Secondary | ICD-10-CM | POA: Insufficient documentation

## 2024-07-31 DIAGNOSIS — I87001 Postthrombotic syndrome without complications of right lower extremity: Secondary | ICD-10-CM | POA: Diagnosis not present

## 2024-07-31 DIAGNOSIS — Z87891 Personal history of nicotine dependence: Secondary | ICD-10-CM | POA: Insufficient documentation

## 2024-07-31 DIAGNOSIS — Z86718 Personal history of other venous thrombosis and embolism: Secondary | ICD-10-CM | POA: Diagnosis not present

## 2024-07-31 DIAGNOSIS — N183 Chronic kidney disease, stage 3 unspecified: Secondary | ICD-10-CM | POA: Diagnosis not present

## 2024-07-31 DIAGNOSIS — Z8546 Personal history of malignant neoplasm of prostate: Secondary | ICD-10-CM

## 2024-07-31 DIAGNOSIS — N1831 Chronic kidney disease, stage 3a: Secondary | ICD-10-CM

## 2024-07-31 LAB — CMP (CANCER CENTER ONLY)
ALT: 36 U/L (ref 0–44)
AST: 37 U/L (ref 15–41)
Albumin: 4.4 g/dL (ref 3.5–5.0)
Alkaline Phosphatase: 63 U/L (ref 38–126)
Anion gap: 12 (ref 5–15)
BUN: 30 mg/dL — ABNORMAL HIGH (ref 8–23)
CO2: 28 mmol/L (ref 22–32)
Calcium: 10 mg/dL (ref 8.9–10.3)
Chloride: 103 mmol/L (ref 98–111)
Creatinine: 1.51 mg/dL — ABNORMAL HIGH (ref 0.61–1.24)
GFR, Estimated: 46 mL/min — ABNORMAL LOW (ref >60)
Glucose, Bld: 97 mg/dL (ref 70–99)
Potassium: 4 mmol/L (ref 3.5–5.1)
Sodium: 142 mmol/L (ref 135–145)
Total Bilirubin: 1.1 mg/dL (ref 0.0–1.2)
Total Protein: 7.2 g/dL (ref 6.5–8.1)

## 2024-07-31 LAB — CBC WITH DIFFERENTIAL (CANCER CENTER ONLY)
Abs Immature Granulocytes: 0.03 K/uL (ref 0.00–0.07)
Basophils Absolute: 0 K/uL (ref 0.0–0.1)
Basophils Relative: 1 %
Eosinophils Absolute: 0.1 K/uL (ref 0.0–0.5)
Eosinophils Relative: 1 %
HCT: 47.2 % (ref 39.0–52.0)
Hemoglobin: 16.8 g/dL (ref 13.0–17.0)
Immature Granulocytes: 0 %
Lymphocytes Relative: 27 %
Lymphs Abs: 1.9 K/uL (ref 0.7–4.0)
MCH: 31 pg (ref 26.0–34.0)
MCHC: 35.6 g/dL (ref 30.0–36.0)
MCV: 87.1 fL (ref 80.0–100.0)
Monocytes Absolute: 0.7 K/uL (ref 0.1–1.0)
Monocytes Relative: 10 %
Neutro Abs: 4.2 K/uL (ref 1.7–7.7)
Neutrophils Relative %: 61 %
Platelet Count: 193 K/uL (ref 150–400)
RBC: 5.42 MIL/uL (ref 4.22–5.81)
RDW: 13.2 % (ref 11.5–15.5)
WBC Count: 6.9 K/uL (ref 4.0–10.5)
nRBC: 0 % (ref 0.0–0.2)

## 2024-07-31 MED ORDER — APIXABAN 2.5 MG PO TABS: 2.5000 mg | ORAL_TABLET | Freq: Two times a day (BID) | ORAL | 1 refills | Status: DC

## 2024-07-31 MED ORDER — APIXABAN 2.5 MG PO TABS: 2.5000 mg | ORAL_TABLET | Freq: Two times a day (BID) | ORAL | 3 refills | Status: AC

## 2024-07-31 NOTE — Assessment & Plan Note (Signed)
Recommend leg elevation and compression stocking. 

## 2024-07-31 NOTE — Progress Notes (Signed)
 Hematology/Oncology Progress note Telephone:(336) [phone] Fax:(336) [phone]     CHIEF COMPLAINTS/REASON FOR VISIT:  right lower extremity DVT   ASSESSMENT & PLAN:   History of DVT (deep vein thrombosis) Previous work up showed negative  prothrombin gene mutation, factor V Leiden mutation, Oct 2023  right lower extremity ultrasound negative.  Continue  Eliquis  to 2.5mg  BID, long term maintenance.   History of prostate cancer Recent PSA 0.01, stable.  Family history of sarcoma and colorectal cancer. He has no children and declined genetic testing.   Post-thrombotic syndrome of right lower extremity Recommend leg elevation and compression stocking.  CKD (chronic kidney disease) stage 3, GFR 30-59 ml/min (HCC) Encourage oral hydration and avoid nephrotoxins.     Orders Placed This Encounter  Procedures   CBC with Differential (Cancer Center Only)    Standing Status:   Future    Expected Date:   07/31/2025    Expiration Date:   10/29/2025   CMP (Cancer Center only)    Standing Status:   Future    Expected Date:   07/31/2025    Expiration Date:   10/29/2025   Follow up in 1 year All questions were answered. The patient knows to call the clinic with any problems, questions or concerns.  Zelphia Cap, MD, PhD [facility] Hematology Oncology 07/31/2024      HISTORY OF PRESENTING ILLNESS:   Curtis Willinger. is a  83 y.o.  male with PMH listed below was seen in consultation at the request of  Gretta Comer POUR, NP  for evaluation of right lower extremity DVT Patient has chronic back pain with intermittent right swelling pain.  He follows up with neurosurgeon.   03/02/2022, patient was seen by primary care provider.  He has noticed his chronic right lower extremity swelling worse.  Ultrasound was ordered for further evaluation.  Same day lower extremity ultrasound showed DVT of the posterior tibial vein,  Patient has history of prostate cancer, diagnosed in 2009 recent PSA is  undetectable. Patient has been started on Eliquis  and currently on Eliquis  5 mg twice daily.  He tolerates well.  Denies any bleeding events.  Right lower extremity swelling is slightly better however not completely resolved.  Patient denies any family history of VTE or personal history  of previous VTE.  Denies any immobilization factors which may contribute to his DVT   INTERVAL HISTORY Curtis Vandrunen. is a 83 y.o. male who has above history reviewed by me today presents for follow up visit for right lower extremity DVT Patient has been off Eliquis  2.5mg  BID. He denies chest pain, SOB, calf pain. No new complains.  Chronic right lower extremity edema, unchanged.     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  Neoplasm of uncertain behavior of stomach, intestines, and rectum    Prostate cancer (HCC) 2009   Psoriasis     SURGICAL HISTORY: Past Surgical History:  Procedure Laterality Date   APPENDECTOMY  2006   BOWEL RESECTION     CATARACT EXTRACTION W/PHACO Right 01/05/2021   Procedure: CATARACT EXTRACTION PHACO AND INTRAOCULAR LENS PLACEMENT (IOC) RIGHT OMIDRIA ;  Surgeon: Mittie Gaskin, MD;  Location: [facility];  Service: Ophthalmology;  Laterality: Right;  cde 5.46 01:01.7 minutes 8.9%   CATARACT  EXTRACTION W/PHACO Left 01/26/2021   Procedure: CATARACT EXTRACTION PHACO AND INTRAOCULAR LENS PLACEMENT (IOC) LEFT OMIDRIA  5.35 00:50.6;  Surgeon: Mittie Gaskin, MD;  Location: [facility];  Service: Ophthalmology;  Laterality: Left;   COLON SURGERY  2015   COLONOSCOPY N/A 12/27/2023   Procedure: COLONOSCOPY;  Surgeon: Onita Elspeth Sharper, DO;  Location: [facility];  Service: Gastroenterology;  Laterality: N/A;   COLONOSCOPY WITH PROPOFOL  N/A 06/05/2017   Procedure: COLONOSCOPY WITH PROPOFOL ;  Surgeon: Therisa Bi, MD;  Location[facility];  Service: Gastroenterology;  Laterality: N/A;   COLONOSCOPY WITH PROPOFOL  N/A 06/15/2020   Procedure: COLONOSCOPY WITH PROPOFOL ;  Surgeon: Therisa Bi, MD;  Location: [facility] END[facility];  Service: Gastroenterology;  Laterality: N/A;   COLONOSCOPY WITH PROPOFOL  N/A 11/16/2020   Procedure: COLONOSCOPY WITH PROPOFOL ;  Surgeon: Therisa Bi, MD;  Location: [facility] ENDOSCOPY;  Service: Gastroenterology;  Laterality: N/A;   EYE SURGERY     KNEE ARTHROSCOPY Right    POLYPECTOMY  12/27/2023   Procedure: POLYPECTOMY, INTESTINE;  Surgeon: Onita Elspeth Sharper, DO;  Location: [facility] ENDOSCOPY;  Service: Gastroenterology;;   PROSTATECTOMY     thyroid  biop      SOCIAL HISTORY: Social History   Socioeconomic History   Marital status: Married    Spouse name: Not on file   Number of children: Not on file   Years of education: Not on file   Highest education level: Doctorate  Occupational History   Not on file  Tobacco Use   Smoking status: Former    Current packs/day: 0.00    Average packs/day: 2.0 packs/day for 15.0 years (30.0 ttl pk-yrs)    Types: Cigarettes    Start date: 89    Quit date: 1998    Years since quitting: 27.9   Smokeless tobacco: Never  Vaping Use   Vaping status: Never Used  Substance and Sexual Activity   Alcohol use: Yes    Alcohol/week: 7.0 standard drinks of alcohol    Types: 7 Glasses of wine per week     Comment: occasionally   Drug use: Not Currently   Sexual activity: Not Currently  Other Topics Concern   Not on file  Social History Narrative   Married.   No children.   Retired. Once worked for [employer].   Enjoys working on his house, playing golf, riding his bike.    Social Drivers of Corporate Investment Banker Strain: Low Risk  (07/04/2023)   Overall Financial Resource Strain (CARDIA)    Difficulty of Paying Living Expenses: Not very hard  Food Insecurity: No Food Insecurity (07/04/2023)   Hunger Vital Sign    Worried About Running Out of Food in the Last Year: Never true    Ran Out of Food in the Last Year: Never true  Transportation Needs: No Transportation Needs (07/04/2023)   PRAPARE - Administrator, Civil Service (Medical): No    Lack of Transportation (Non-Medical): No  Physical Activity: Sufficiently Active (07/04/2023)  Exercise Vital Sign    Days of Exercise per Week: 3 days    Minutes of Exercise per Session: 150+ min  Stress: No Stress Concern Present (07/04/2023)   [employer] of Occupational Health - Occupational Stress Questionnaire    Feeling of Stress : Only a little  Social Connections: Unknown (07/04/2023)   Social Connection and Isolation Panel    Frequency of Communication with Friends and Family: More than three times a week    Frequency of Social Gatherings with Friends and Family: More than three times a week    Attends Religious Services: Patient declined    Database Administrator or Organizations: Yes    Attends Engineer, Structural: More than 4 times per year    Marital Status: Married  Recent Concern: Social Connections - Moderately Isolated (06/28/2023)   Social Connection and Isolation Panel    Frequency of Communication with Friends and Family: More than three times a week    Frequency of Social Gatherings with Friends and Family: Once a week    Attends Religious Services: Never    Database Administrator or  Organizations: No    Attends Banker Meetings: Never    Marital Status: Married  Catering Manager Violence: Not At Risk (06/28/2023)   Humiliation, Afraid, Rape, and Kick questionnaire    Fear of Current or Ex-Partner: No    Emotionally Abused: No    Physically Abused: No    Sexually Abused: No    FAMILY HISTORY: Family History  Problem Relation Age of Onset   Emphysema Father    Diabetes Sister     ALLERGIES:  is allergic to lidocaine .  MEDICATIONS:  Current Outpatient Medications  Medication Sig Dispense Refill   atorvastatin  (LIPITOR) 40 MG tablet TAKE 1 TABLET DAILY FOR    CHOLESTEROL 90 tablet 0   Coenzyme Q10 (COQ10 PO) Take 1 capsule by mouth daily.      GLUCOSAMINE SULFATE PO Take by mouth.     Iron -Vitamin C  65-125 MG TABS Take 1 tablet by mouth daily.     losartan -hydrochlorothiazide  (HYZAAR) 100-25 MG tablet TAKE 1 TABLET DAILY FOR    BLOOD PRESSURE 90 tablet 0   Multiple Vitamin (MULTIVITAMIN) tablet Take 1 tablet by mouth daily.     Omega-3 Fatty Acids (FISH OIL) 1000 MG CAPS Take 1,000 mg by mouth 2 (two) times daily.     sertraline  (ZOLOFT ) 100 MG tablet Take 1 tablet (100 mg total) by mouth daily. for anxiety and depression. 90 tablet 3   apixaban  (ELIQUIS ) 2.5 MG TABS tablet Take 1 tablet (2.5 mg total) by mouth 2 (two) times daily. 180 tablet 3   No current facility-administered medications for this visit.    Review of Systems  Constitutional:  Negative for appetite change, chills, fatigue, fever and unexpected weight change.  HENT:   Negative for hearing loss and voice change.   Eyes:  Negative for eye problems and icterus.  Respiratory:  Negative for chest tightness, cough and shortness of breath.   Cardiovascular:  Positive for leg swelling. Negative for chest pain.  Gastrointestinal:  Negative for abdominal distention and abdominal pain.  Endocrine: Negative for hot flashes.  Genitourinary:  Negative for difficulty urinating, dysuria and  frequency.   Musculoskeletal:  Negative for arthralgias.  Skin:  Negative for itching and rash.  Neurological:  Negative for light-headedness and numbness.  Hematological:  Negative for adenopathy. Does not bruise/bleed easily.  Psychiatric/Behavioral:  Negative for confusion.  PHYSICAL EXAMINATION: ECOG PERFORMANCE STATUS: 0 - Asymptomatic Vitals:   07/31/24 1117 07/31/24 1126  BP: (!) 166/83 (!) 145/88  Pulse: (!) 55   Resp: 17   Temp: 97.6 F (36.4 C)   SpO2: 96%    Filed Weights   07/31/24 1117  Weight: 244 lb 14.4 oz (111.1 kg)    Physical Exam Constitutional:      General: He is not in acute distress. HENT:     Head: Normocephalic and atraumatic.  Eyes:     General: No scleral icterus. Cardiovascular:     Rate and Rhythm: Normal rate and regular rhythm.     Heart sounds: Normal heart sounds.  Pulmonary:     Effort: Pulmonary effort is normal. No respiratory distress.     Breath sounds: No wheezing.  Abdominal:     General: Bowel sounds are normal. There is no distension.     Palpations: Abdomen is soft.  Musculoskeletal:        General: No deformity. Normal range of motion.     Cervical back: Normal range of motion and neck supple.     Comments: Trace right lower extremity edema.   Skin:    General: Skin is warm and dry.     Findings: No erythema or rash.  Neurological:     Mental Status: He is alert and oriented to person, place, and time. Mental status is at baseline.     Cranial Nerves: No cranial nerve deficit.     Coordination: Coordination normal.  Psychiatric:        Mood and Affect: Mood normal.     LABORATORY DATA:  I have reviewed the data as listed    Latest Ref Rng & Units 07/31/2024   10:47 AM 01/31/2024    9:46 AM 07/31/2023    9:47 AM  CBC  WBC 4.0 - 10.5 K/uL 6.9  6.5  5.5   Hemoglobin 13.0 - 17.0 g/dL 83.1  87.9  86.5   Hematocrit 39.0 - 52.0 % 47.2  38.1  41.5   Platelets 150 - 400 K/uL 193  246  218       Latest Ref Rng &  Units 07/31/2024   10:47 AM 07/15/2024    8:31 AM 05/20/2024   11:40 AM  CMP  Glucose 70 - 99 mg/dL 97   92   BUN 8 - 23 mg/dL 30   32   Creatinine 9.38 - 1.24 mg/dL 8.48   8.55   Sodium 864 - 145 mmol/L 142   141   Potassium 3.5 - 5.1 mmol/L 4.0   3.4   Chloride 98 - 111 mmol/L 103   103   CO2 22 - 32 mmol/L 28   28   Calcium  8.9 - 10.3 mg/dL 89.9   89.5   Total Protein 6.5 - 8.1 g/dL 7.2  7.1    Total Bilirubin 0.0 - 1.2 mg/dL 1.1  1.3    Alkaline Phos 38 - 126 U/L 63  54    AST 15 - 41 U/L 37  32    ALT 0 - 44 U/L 36  36        RADIOGRAPHIC STUDIES: I have personally reviewed the radiological images as listed and agreed with the findings in the report. ECHOCARDIOGRAM COMPLETE Result Date: 05/23/2024    ECHOCARDIOGRAM REPORT   Patient Name:   Curtis Filippini. Date of Exam: 05/23/2024 Medical Rec #:  [id number]  Height:       74.0 in Accession #:    [id number]         Weight:       254.0 lb Date of Birth:  04/15/1941          BSA:          2.406 m Patient Age:    83 years           BP:           132/74 mmHg Patient Gender: M                  HR:           59 bpm. Exam Location:  [facility] Procedure: 2D Echo, 3D Echo, Cardiac Doppler, Color Doppler and Strain Analysis            (Both Spectral and Color Flow Doppler were utilized during            procedure). Indications:     Bilateral lower extremity edema  History:         Patient has no prior history of Echocardiogram examinations.                  Risk Factors:Hypertension and Dyslipidemia.  Sonographer:     Christopher Furnace Referring Phys:  [id number] KATHERINE K CLARK Diagnosing Phys: Keller Paterson  Sonographer Comments: Technically challenging study due to limited acoustic windows and no parasternal window. Global longitudinal strain was attempted. IMPRESSIONS  1. Left ventricular ejection fraction, by estimation, is 50 to 55%. The left ventricle has low normal function. The left ventricle has no regional wall motion abnormalities. There is  mild left ventricular hypertrophy. Left ventricular diastolic parameters are consistent with Grade I diastolic dysfunction (impaired relaxation). The global longitudinal strain is abnormal.  2. Right ventricular systolic function is normal. The right ventricular size is normal.  3. The mitral valve is normal in structure. Trivial mitral valve regurgitation.  4. The aortic valve was not well visualized. Aortic valve regurgitation is not visualized. Aortic valve sclerosis/calcification is present, without any evidence of aortic stenosis.  5. The inferior vena cava is normal in size with greater than 50% respiratory variability, suggesting right atrial pressure of 3 mmHg. FINDINGS  Left Ventricle: Left ventricular ejection fraction, by estimation, is 50 to 55%. The left ventricle has low normal function. The left ventricle has no regional wall motion abnormalities. Strain was performed and the global longitudinal strain is abnormal. The left ventricular internal cavity size was normal in size. There is mild left ventricular hypertrophy. Left ventricular diastolic parameters are consistent with Grade I diastolic dysfunction (impaired relaxation). Right Ventricle: The right ventricular size is normal. No increase in right ventricular wall thickness. Right ventricular systolic function is normal. Left Atrium: Left atrial size was normal in size. Right Atrium: Right atrial size was normal in size. Pericardium: There is no evidence of pericardial effusion. Mitral Valve: The mitral valve is normal in structure. Trivial mitral valve regurgitation. Tricuspid Valve: The tricuspid valve is normal in structure. Tricuspid valve regurgitation is trivial. Aortic Valve: The aortic valve was not well visualized. Aortic valve regurgitation is not visualized. Aortic valve sclerosis/calcification is present, without any evidence of aortic stenosis. Aortic valve mean gradient measures 7.7 mmHg. Aortic valve peak gradient measures 14.6  mmHg. Aortic valve area, by VTI measures 1.65 cm. Pulmonic Valve: The pulmonic valve was not well visualized. Pulmonic valve regurgitation is not visualized. Aorta: The aortic root was not  well visualized. Venous: The inferior vena cava is normal in size with greater than 50% respiratory variability, suggesting right atrial pressure of 3 mmHg. IAS/Shunts: The atrial septum is grossly normal.  LEFT VENTRICLE PLAX 2D LVIDd:         4.20 cm   Diastology LVIDs:         3.00 cm   LV e' medial:    5.44 cm/s LV PW:         1.10 cm   LV E/e' medial:  8.1 LV IVS:        1.30 cm   LV e' lateral:   5.11 cm/s LVOT diam:     2.00 cm   LV E/e' lateral: 8.6 LV SV:         60 LV SV Index:   25 LVOT Area:     3.14 cm  RIGHT VENTRICLE RV S prime:     11.90 cm/s TAPSE (M-mode): 1.3 cm LEFT ATRIUM             Index        RIGHT ATRIUM           Index LA diam:        3.00 cm 1.25 cm/m   RA Area:     15.00 cm LA Vol (A2C):   61.3 ml 25.48 ml/m  RA Volume:   33.90 ml  14.09 ml/m LA Vol (A4C):   58.7 ml 24.40 ml/m LA Biplane Vol: 62.8 ml 26.10 ml/m  AORTIC VALVE AV Area (Vmax):    1.37 cm AV Area (Vmean):   1.40 cm AV Area (VTI):     1.65 cm AV Vmax:           191.00 cm/s AV Vmean:          126.333 cm/s AV VTI:            0.362 m AV Peak Grad:      14.6 mmHg AV Mean Grad:      7.7 mmHg LVOT Vmax:         83.00 cm/s LVOT Vmean:        56.400 cm/s LVOT VTI:          0.190 m LVOT/AV VTI ratio: 0.52  AORTA Ao Root diam: 2.30 cm MITRAL VALVE               TRICUSPID VALVE MV Area (PHT): 1.50 cm    TR Peak grad:   13.5 mmHg MV Decel Time: 505 msec    TR Vmax:        184.00 cm/s MV E velocity: 44.00 cm/s MV A velocity: 90.70 cm/s  SHUNTS MV E/A ratio:  0.49        Systemic VTI:  0.19 m                            Systemic Diam: 2.00 cm Keller Paterson Electronically signed by Keller Paterson Signature Date/Time: 05/23/2024/12:47:59 PM    Final

## 2024-07-31 NOTE — Assessment & Plan Note (Signed)
 Encourage oral hydration and avoid nephrotoxins.

## 2024-07-31 NOTE — Assessment & Plan Note (Signed)
 Recent PSA 0.01, stable.  Family history of sarcoma and colorectal cancer. He has no children and declined genetic testing.

## 2024-07-31 NOTE — Assessment & Plan Note (Signed)
Previous work up showed negative  prothrombin gene mutation, factor V Leiden mutation, Oct 2023  right lower extremity ultrasound negative.  Continue  Eliquis to 2.5mg  BID, long term maintenance.

## 2024-08-10 ENCOUNTER — Other Ambulatory Visit: Payer: Self-pay | Admitting: Primary Care

## 2024-08-10 DIAGNOSIS — E785 Hyperlipidemia, unspecified: Secondary | ICD-10-CM

## 2024-09-06 ENCOUNTER — Other Ambulatory Visit: Payer: Self-pay | Admitting: Primary Care

## 2024-09-06 DIAGNOSIS — I10 Essential (primary) hypertension: Secondary | ICD-10-CM

## 2024-09-16 ENCOUNTER — Ambulatory Visit

## 2024-09-16 VITALS — BP 130/82 | Ht 73.5 in | Wt 242.7 lb

## 2024-09-16 DIAGNOSIS — Z0000 Encounter for general adult medical examination without abnormal findings: Secondary | ICD-10-CM

## 2024-09-16 NOTE — Patient Instructions (Signed)
 Curtis Black,  Thank you for taking the time for your Medicare Wellness Visit. I appreciate your continued commitment to your health goals. Please review the care plan we discussed, and feel free to reach out if I can assist you further.  Please note that Annual Wellness Visits do not include a physical exam. Some assessments may be limited, especially if the visit was conducted virtually. If needed, we may recommend an in-person follow-up with your provider.  Ongoing Care Seeing your primary care provider every 3 to 6 months helps us  monitor your health and provide consistent, personalized care.   Referrals If a referral was made during today's visit and you haven't received any updates within two weeks, please contact the referred provider directly to check on the status.  Recommended Screenings:  Health Maintenance  Topic Date Due   Medicare Annual Wellness Visit  06/27/2024   COVID-19 Vaccine (8 - Pfizer risk 2025-26 season) 11/16/2024   Colon Cancer Screening  12/27/2026   DTaP/Tdap/Td vaccine (2 - Td or Tdap) 11/14/2027   Pneumococcal Vaccine for age over 47  Completed   Flu Shot  Completed   Zoster (Shingles) Vaccine  Completed   Meningitis B Vaccine  Aged Out   Hepatitis C Screening  Discontinued       09/16/2024    8:16 AM  Advanced Directives  Does Patient Have a Medical Advance Directive? Yes  Type of Estate Agent of [place];Living will  Does patient want to make changes to medical advance directive? No - Patient declined  Copy of Healthcare Power of Attorney in Chart? No - copy requested    Vision: Annual vision screenings are recommended for early detection of glaucoma, cataracts, and diabetic retinopathy. These exams can also reveal signs of chronic conditions such as diabetes and high blood pressure.  Dental: Annual dental screenings help detect early signs of oral cancer, gum disease, and other conditions linked to overall health, including  heart disease and diabetes.  Please see the attached documents for additional preventive care recommendations.

## 2024-09-16 NOTE — Progress Notes (Signed)
 "  Chief Complaint  Patient presents with   Medicare Wellness     Subjective:   Curtis Sibert. is a 84 y.o. male who presents for a Medicare Annual Wellness Visit.  Visit info / Clinical Intake: Medicare Wellness Visit Type:: Subsequent Annual Wellness Visit Persons participating in visit and providing information:: patient Medicare Wellness Visit Mode:: In-person (required for WTM) Interpreter Needed?: No Pre-visit prep was completed: yes AWV questionnaire completed by patient prior to visit?: no Living arrangements:: lives with spouse/significant other Patient's Overall Health Status Rating: very good Typical amount of pain: some Does pain affect daily life?: no Are you currently prescribed opioids?: no  Dietary Habits and Nutritional Risks How many meals a day?: 2 Eats fruit and vegetables daily?: yes Most meals are obtained by: preparing own meals; eating out In the last 2 weeks, have you had any of the following?: none Diabetic:: no  Functional Status Activities of Daily Living (to include ambulation/medication): Independent Ambulation: Independent Medication Administration: Independent Home Management (perform basic housework or laundry): Independent Manage your own finances?: yes Primary transportation is: driving Concerns about vision?: no *vision screening is required for WTM* Concerns about hearing?: no  Fall Screening Falls in the past year?: 0 Number of falls in past year: 0 Was there an injury with Fall?: 0 Fall Risk Category Calculator: 0 Patient Fall Risk Level: Low Fall Risk  Fall Risk Patient at Risk for Falls Due to: No Fall Risks Fall risk Follow up: Falls evaluation completed; Education provided; Falls prevention discussed  Home and Transportation Safety: All rugs have non-skid backing?: (!) no All stairs or steps have railings?: yes Grab bars in the bathtub or shower?: yes Have non-skid surface in bathtub or shower?: yes Good home  lighting?: yes Regular seat belt use?: yes Hospital stays in the last year:: no  Cognitive Assessment Difficulty concentrating, remembering, or making decisions? : no Will 6CIT or Mini Cog be Completed: yes What year is it?: 0 points What month is it?: 0 points Give patient an address phrase to remember (5 components): [address] [place]  About what time is it?: 0 points Count backwards from 20 to 1: 0 points Say the months of the year in reverse: 0 points Repeat the address phrase from earlier: 0 points 6 CIT Score: 0 points  Advance Directives (For Healthcare) Does Patient Have a Medical Advance Directive?: Yes Does patient want to make changes to medical advance directive?: No - Patient declined Type of Advance Directive: Healthcare Power of [place]; Living will Copy of Healthcare Power of Attorney in Chart?: No - copy requested Copy of Living Will in Chart?: No - copy requested Would patient like information on creating a medical advance directive?: No - Patient declined  Reviewed/Updated  Reviewed/Updated: Reviewed All (Medical, Surgical, Family, Medications, Allergies, Care Teams, Patient Goals)    Allergies (verified) Lidocaine    Current Medications (verified) Outpatient Encounter Medications as of 09/16/2024  Medication Sig   apixaban  (ELIQUIS ) 2.5 MG TABS tablet Take 1 tablet (2.5 mg total) by mouth 2 (two) times daily.   atorvastatin  (LIPITOR) 40 MG tablet TAKE 1 TABLET DAILY FOR    CHOLESTEROL   Coenzyme Q10 (COQ10 PO) Take 1 capsule by mouth daily.    GLUCOSAMINE SULFATE PO Take by mouth.   Iron -Vitamin C  65-125 MG TABS Take 1 tablet by mouth daily.   losartan -hydrochlorothiazide  (HYZAAR) 100-25 MG tablet TAKE 1 TABLET DAILY FOR    BLOOD PRESSURE   Multiple Vitamin (MULTIVITAMIN) tablet Take  1 tablet by mouth daily.   Omega-3 Fatty Acids (FISH OIL) 1000 MG CAPS Take 1,000 mg by mouth 2 (two) times daily.   sertraline  (ZOLOFT ) 100 MG tablet Take 1 tablet  (100 mg total) by mouth daily. for anxiety and depression.   No facility-administered encounter medications on file as of 09/16/2024.    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    Neoplasm of uncertain behavior of stomach, intestines, and rectum    Prostate cancer (HCC) 2009   Psoriasis    Past Surgical History:  Procedure Laterality Date   APPENDECTOMY  2006   BOWEL RESECTION     CATARACT EXTRACTION W/PHACO Right 01/05/2021   Procedure: CATARACT EXTRACTION PHACO AND INTRAOCULAR LENS PLACEMENT (IOC) RIGHT OMIDRIA ;  Surgeon: Mittie Gaskin, MD;  Location: [facility];  Service: Ophthalmology;  Laterality: Right;  cde 5.46 01:01.7 minutes 8.9%   CATARACT EXTRACTION W/PHACO Left 01/26/2021   Procedure: CATARACT EXTRACTION PHACO AND INTRAOCULAR LENS PLACEMENT (IOC) LEFT OMIDRIA  5.35 00:50.6;  Surgeon: Mittie Gaskin, MD;  Location: [facility];  Service: Ophthalmology;  Laterality: Left;   COLON SURGERY  2015   COLONOSCOPY N/A 12/27/2023   Procedure: COLONOSCOPY;  Surgeon: Onita Elspeth Sharper, DO;  Location: [facility] END[facility]COPY;  Service: Gastroenterology;  Laterality: N/A;   COLONOSCOPY WITH PROPOFOL  N/A 06/05/2017   Procedure: COLONOSCOPY WITH PROPOFOL ;  Surgeon: Therisa Bi, MD;  Location: [facility] ENDOSCOPY;   Service: Gastroenterology;  Laterality: N/A;   COLONOSCOPY WITH PROPOFOL  N/A 06/15/2020   Procedure: COLONOSCOPY WITH PROPOFOL ;  Surgeon: Therisa Bi, MD;  Location: [facility] ENDOSCOPY;  Service: Gastroenterology;  Laterality: N/A;   COLONOSCOPY WITH PROPOFOL  N/A 11/16/2020   Procedure: COLONOSCOPY WITH PROPOFOL ;  Surgeon: Therisa Bi, MD;  Location: [facility] ENDOSCOPY;  Service: Gastroenterology;  Laterality: N/A;   EYE SURGERY     KNEE ARTHROSCOPY Right    POLYPECTOMY  12/27/2023   Procedure: POLYPECTOMY, INTESTINE;  Surgeon: Onita Elspeth Sharper, DO;  Location: [facility] ENDOSCOPY;  Service: Gastroenterology;;   PROSTATECTOMY     thyroid  biop     Family History  Problem Relation Age of Onset   Emphysema Father    Diabetes Sister    Social History   Occupational History   Not on file  Tobacco Use   Smoking status: Former    Current packs/day: 0.00    Average packs/day: 2.0 packs/day for 15.0 years (30.0 ttl pk-yrs)    Types: Cigarettes    Start date: 76    Quit date: 1998    Years since quitting: 28.0   Smokeless tobacco: Never  Vaping Use   Vaping status: Never Used  Substance and Sexual Activity   Alcohol use: Yes    Alcohol/week: 7.0 standard drinks of alcohol    Types: 7 Glasses of  wine per week    Comment: occasionally   Drug use: Not Currently   Sexual activity: Not Currently   Tobacco Counseling Counseling given: Not Answered  SDOH Screenings   Food Insecurity: No Food Insecurity (09/16/2024)  Housing: Unknown (09/16/2024)  Transportation Needs: No Transportation Needs (09/16/2024)  Utilities: Not At Risk (09/16/2024)  Alcohol Screen: Low Risk (07/04/2023)  Depression (PHQ2-9): Low Risk (09/16/2024)  Financial Resource Strain: Low Risk (07/04/2023)  Physical Activity: Sufficiently Active (09/16/2024)  Social Connections: Moderately Integrated (09/16/2024)  Stress: No Stress Concern Present (09/16/2024)  Tobacco Use: Medium Risk (09/16/2024)  Health Literacy: Adequate  Health Literacy (09/16/2024)   See flowsheets for full screening details  Depression Screen PHQ 2 & 9 Depression Scale- Over the past 2 weeks, how often have you been bothered by any of the following problems? Little interest or pleasure in doing things: 0 Feeling down, depressed, or hopeless (PHQ Adolescent also includes...irritable): 0 PHQ-2 Total Score: 0 Trouble falling or staying asleep, or sleeping too much: 0 Feeling tired or having little energy: 0 Poor appetite or overeating (PHQ Adolescent also includes...weight loss): 0 Feeling bad about yourself - or that you are a failure or have let yourself or your family down: 0 Trouble concentrating on things, such as reading the newspaper or watching television (PHQ Adolescent also includes...like school work): 0 Moving or speaking so slowly that other people could have noticed. Or the opposite - being so fidgety or restless that you have been moving around a lot more than usual: 0 Thoughts that you would be better off dead, or of hurting yourself in some way: 0 PHQ-9 Total Score: 0 If you checked off any problems, how difficult have these problems made it for you to do your work, take care of things at home, or get along with other people?: Not difficult at all     Goals Addressed             This Visit's Progress    I would like to continue my golf game 3 days per week and make 93               Objective:    Today's Vitals   09/16/24 0813  BP: 130/82  Weight: 242 lb 11.2 oz (110.1 kg)  Height: 6' 1.5 (1.867 m)   Body mass index is 31.59 kg/m.  Hearing/Vision screen No results found. Immunizations and Health Maintenance Health Maintenance  Topic Date Due   Medicare Annual Wellness (AWV)  06/27/2024   COVID-19 Vaccine (8 - Pfizer risk 2025-26 season) 11/16/2024   Colonoscopy  12/27/2026   DTaP/Tdap/Td (2 - Td or Tdap) 11/14/2027   Pneumococcal Vaccine: 50+ Years  Completed   Influenza Vaccine  Completed    Zoster Vaccines- Shingrix  Completed   Meningococcal B Vaccine  Aged Out   Hepatitis C Screening  Discontinued        Assessment/Plan:  This is a routine wellness examination for Curtis Black.  Patient Care Team: Gretta Comer POUR, NP as PCP - General (Internal Medicine) Richarda repine as Consulting Physician (Dentistry) Hester Alm BROCKS, MD (Dermatology) Mittie Gaskin, MD as Referring Physician (Ophthalmology) Babara Call, MD as Consulting Physician (Oncology)  I have personally reviewed and noted the following in the patients chart:   Medical and social history Use of alcohol, tobacco or illicit drugs  Current medications and supplements including opioid prescriptions. Functional ability and status Nutritional status Physical activity Advanced directives List of other physicians Hospitalizations, surgeries, and ER visits  in previous 12 months Vitals Screenings to include cognitive, depression, and falls Referrals and appointments  No orders of the defined types were placed in this encounter.  In addition, I have reviewed and discussed with patient certain preventive protocols, quality metrics, and best practice recommendations. A written personalized care plan for preventive services as well as general preventive health recommendations were provided to patient.   Curtis LITTIE Saris, LPN   8/79/7973   No follow-ups on file.  After Visit Summary: (In Person-Declined) Patient declined AVS at this time.  Nurse Notes: No voiced or noted concerns at this time Patient advised to keep follow-up appointment with PCP (Jan 2027) Appointment(s) made: (AWV/CPE Jan 2027)  "

## 2024-10-01 ENCOUNTER — Encounter: Payer: Self-pay | Admitting: Primary Care

## 2024-10-01 ENCOUNTER — Ambulatory Visit: Admitting: Primary Care

## 2024-10-01 VITALS — BP 164/86 | HR 55 | Temp 98.2°F | Ht 73.5 in | Wt 247.4 lb

## 2024-10-01 DIAGNOSIS — I10 Essential (primary) hypertension: Secondary | ICD-10-CM

## 2024-10-01 DIAGNOSIS — R2681 Unsteadiness on feet: Secondary | ICD-10-CM | POA: Diagnosis not present

## 2024-10-01 NOTE — Assessment & Plan Note (Signed)
 Above goal today, even higher on recheck As home readings are at goal, he will send 1 week of blood pressure readings next week  Continue losartan -hydrochlorothiazide  100-25 milligrams daily for now

## 2024-10-01 NOTE — Patient Instructions (Signed)
 Start monitoring your blood pressure daily, around the same time of day, for the next 1 weeks.  Ensure that you have rested for 30 minutes prior to checking your blood pressure.   Record your readings and notify me via MyChart.  Increase physical activity.   It was a pleasure to see you today!

## 2024-10-01 NOTE — Assessment & Plan Note (Addendum)
 Overall intact during today's visit. No alarm signs.  There is not obvious evidence of Parkinson's disorder or CVA.  Question whether the increased dose of Zoloft  100 mg is contributing.  Reviewed labs from November and December 2025. We discussed to increase activity level overall. He plans to resume his membership.   Offered outpatient physical therapy for strength and balance for which he kindly declines. We discussed to report immediately if he develops new symptoms or worsening symptoms  He will also send BP readings in 1 week via mychart.

## 2024-10-01 NOTE — Progress Notes (Signed)
 "  Subjective:    Patient ID: Curtis Black., male    DOB: [birth year], 84 y.o.   MRN: [id number]  Curtis Black. is a very pleasant 84 y.o. male history of hypertension, DVT of lower extremity, prediabetes, hyperlipidemia, chronic back pain who presents today to discuss instability. He is here today as his wife asked for him to come.   Over the last several weeks, his wife has noticed that he is walking slower than usual, leaning to the right side when he walks. He does notice in stability when walking the dog sometimes. He does notice a shuffling gait for which he attributes to his right knee osteoarthritis. He does not shuffle with his left lower extremity.   He is checking his BP at home which runs in 130s/70s. He spends most of his day active in his home and outdoors. He has been more sedentary than usual due to the winter weather. He's sleeping well.   He denies tripping, dyspnea, tremors, lower extremity edema, weakness, unexplained weight loss, unilateral weakness, speech changes, facial dropping, rectal bleeding. His lower extremity edema has resolved after discontinuation of amlodipine .  His dose of Zoloft  was increased to 100 mg in November 2025.  BP Readings from Last 3 Encounters:  10/01/24 (!) 164/86  09/16/24 130/82  07/31/24 (!) 145/88     Review of Systems  Constitutional:  Negative for unexpected weight change.  Respiratory:  Negative for shortness of breath.   Cardiovascular:  Negative for chest pain.  Gastrointestinal:  Negative for blood in stool.  Neurological:  Negative for dizziness, speech difficulty, weakness and numbness.         Past Medical History:  Diagnosis Date   Actinic keratosis    Anxiety    Basal cell carcinoma 03/06/2017   left medial infraorbital lat nose inf to med canthus   Basal cell carcinoma 06/12/2019   sup to glabella   Basal cell carcinoma 12/15/2021   superior to glabella,  Mohs 03/28/2022   Basal cell carcinoma (BCC) of  nostril 06/12/2019   Removed, no other intervention needed per pt.   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    Neoplasm of uncertain behavior of stomach, intestines, and rectum    Prostate cancer (HCC) 2009   Psoriasis     Social History   Socioeconomic History   Marital status: Married    Spouse name: Not on file   Number of children: Not on file   Years of education: Not on file   Highest education level: Doctorate  Occupational History   Not on file  Tobacco Use   Smoking status: Former    Current packs/day: 0.00    Average packs/day: 2.0 packs/day for 15.0 years (30.0 ttl pk-yrs)    Types: Cigarettes    Start date: 41    Quit date: 39    Years since quitting: 28.1   Smokeless tobacco: Never  Vaping Use   Vaping status: Never Used  Substance and Sexual Activity   Alcohol use: Yes    Alcohol/week: 7.0 standard drinks of alcohol    Types: 7 Glasses of wine per week    Comment: occasionally   Drug use: Not Currently  Sexual activity: Not Currently  Other Topics Concern   Not on file  Social History Narrative   Married.   No children.   Retired. Once worked for [employer].   Enjoys working on his house, playing golf, riding his bike.    Social Drivers of Health   Tobacco Use: Medium Risk (10/01/2024)   Patient History    Smoking Tobacco Use: Former    Smokeless Tobacco Use: Never    Passive Exposure: Not on file  Financial Resource Strain: Low Risk (07/04/2023)   Overall Financial Resource Strain (CARDIA)    Difficulty of Paying Living Expenses: Not very hard  Food Insecurity: No Food Insecurity (09/16/2024)   Epic    Worried About Programme Researcher, Broadcasting/film/video in the Last Year: Never true    Ran Out of Food  in the Last Year: Never true  Transportation Needs: No Transportation Needs (09/16/2024)   Epic    Lack of Transportation (Medical): No    Lack of Transportation (Non-Medical): No  Physical Activity: Sufficiently Active (09/16/2024)   Exercise Vital Sign    Days of Exercise per Week: 3 days    Minutes of Exercise per Session: 150+ min  Stress: No Stress Concern Present (09/16/2024)   [employer] of Occupational Health - Occupational Stress Questionnaire    Feeling of Stress: Only a little  Social Connections: Moderately Integrated (09/16/2024)   Social Connection and Isolation Panel    Frequency of Communication with Friends and Family: More than three times a week    Frequency of Social Gatherings with Friends and Family: More than three times a week    Attends Religious Services: Patient declined    Active Member of Clubs or Organizations: Yes    Attends Banker Meetings: More than 4 times per year    Marital Status: Married  Catering Manager Violence: Not At Risk (09/16/2024)   Epic    Fear of Current or Ex-Partner: No    Emotionally Abused: No    Physically Abused: No    Sexually Abused: No  Depression (PHQ2-9): Low Risk (10/01/2024)   Depression (PHQ2-9)    PHQ-2 Score: 2  Alcohol Screen: Low Risk (07/04/2023)   Alcohol Screen    Last Alcohol Screening Score (AUDIT): 4  Housing: Unknown (09/16/2024)   Epic    Unable to Pay for Housing in the Last Year: No    Number of Times Moved in the Last Year: Not on file    Homeless in the Last Year: No  Utilities: Not At Risk (09/16/2024)   Epic    Threatened with loss of utilities: No  Health Literacy: Adequate Health Literacy (09/16/2024)   B1300 Health Literacy    Frequency of need for help with medical instructions: Never    Past Surgical History:  Procedure Laterality Date   APPENDECTOMY  2006   BOWEL RESECTION     CATARACT EXTRACTION W/PHACO Right 01/05/2021   Procedure: CATARACT EXTRACTION PHACO AND  INTRAOCULAR LENS PLACEMENT (IOC) RIGHT OMIDRIA ;  Surgeon: Mittie Gaskin, MD;  Location: [facility];  Service: Ophthalmology;  Laterality: Right;  cde 5.46 01:01.7 minutes 8.9%   CATARACT EXTRACTION W/PHACO Left 01/26/2021   Procedure: CATARACT EXTRACTION PHACO AND INTRAOCULAR LENS PLACEMENT (IOC) LEFT OMIDRIA  5.35 00:50.6;  Surgeon: Mittie Gaskin, MD;  Location: [facility];  Service: Ophthalmology;  Laterality: Left;   COLON SURGERY  2015   COLONOSCOPY N/A 12/27/2023   Procedure: COLONOSCOPY;  Surgeon: Onita Elspeth Sharper, DO;  Location: [facility] ENDOSCOPY;  Service: Gastroenterology;  Laterality: N/A;   COLONOSCOPY WITH PROPOFOL  N/A 06/05/2017   Procedure: COLONOSCOPY WITH PROPOFOL ;  Surgeon: Therisa Bi, MD;  Location: [facility] END[facility]COPY;  Service: Gastroenterology;  Laterality: N/A;   COLONOSCOPY WITH PROPOFOL  N/A 06/15/2020   Procedure: COLONOSCOPY WITH PROPOFOL ;  Surgeon: Therisa Bi, MD;  Location: [facility] ENDOSCOPY;  Service: Gastroenterology;  Laterality: N/A;   COLONOSCOPY WITH PROPOFOL  N/A 11/16/2020   Procedure: COLONOSCOPY WITH PROPOFOL ;  Surgeon: Therisa Bi, MD;  Location: [facility] ENDOSCOPY;  Service: Gastroenterology;  Laterality: N/A;   EYE SURGERY     KNEE ARTHROSCOPY Right    POLYPECTOMY  12/27/2023   Procedure: POLYPECTOMY, INTESTINE;  Surgeon: Onita Elspeth Sharper, DO;  Location: [facility] ENDOSCOPY;  Service: Gastroenterology;;   PROSTATECTOMY     thyroid  biop      Family History  Problem Relation Age of Onset   Emphysema Father    Diabetes Sister     Allergies[1]  Medications Ordered Prior to Encounter[2]  BP (!) 164/86   Pulse (!) 55   Temp 98.2 F (36.8 C) (Oral)   Ht 6' 1.5 (1.867 m)   Wt 247 lb 6 oz (112.2 kg)   SpO2 94%   BMI 32.19 kg/m  Objective:   Physical Exam Cardiovascular:     Rate and Rhythm: Normal rate and regular rhythm.  Pulmonary:     Effort: Pulmonary effort is normal.     Breath sounds: Normal breath sounds.   Musculoskeletal:     Cervical back: Neck supple.  Skin:    General: Skin is warm and dry.  Neurological:     Mental Status: He is alert and oriented to person, place, and time.     Motor: Tremor present. No weakness.     Coordination: Romberg sign negative. Coordination normal.     Gait: Gait is intact.     Deep Tendon Reflexes:     Reflex Scores:      Patellar reflexes are 2+ on the right side and 2+ on the left side.    Comments: Able to walk heel to toe in office.  No leaning during ambulation in office.   Mild right hand resting tremor. No pill rolling.   Psychiatric:        Mood and Affect: Mood normal.     Physical Exam        Assessment & Plan:  Unsteady gait when walking Assessment & Plan: Overall intact during today's visit. No alarm signs.  There is not obvious evidence of Parkinson's disorder or CVA.  Question whether the increased dose of Zoloft  100 mg is contributing.  Reviewed labs from November and December 2025. We discussed to increase activity level overall. He plans to resume his membership.   Offered outpatient physical therapy for strength and balance for which he kindly declines. We discussed to report immediately if he develops new symptoms or worsening symptoms  He will also send BP readings in 1 week via mychart.      Essential hypertension Assessment & Plan: Above goal today, even higher on recheck As home readings are at goal, he will send 1 week of blood pressure readings next week  Continue losartan -hydrochlorothiazide  100-25 milligrams daily for now     Assessment and Plan Assessment & Plan   I personally spent a total of 25 minutes in the care of the patient today including preparing to see the patient, getting/reviewing separately obtained history, performing a medically appropriate exam/evaluation, counseling and educating, and documenting clinical information in the EHR.  Teja Costen K Julis Haubner, NP       [1]   Allergies Allergen Reactions   Lidocaine      Cut off wind  [2]  Current Outpatient Medications on File Prior to Visit  Medication Sig Dispense Refill   acetaminophen (TYLENOL) 500 MG tablet Take 500 mg by mouth 2 (two) times daily.     apixaban  (ELIQUIS ) 2.5 MG TABS tablet Take 1 tablet (2.5 mg total) by mouth 2 (two) times daily. 180 tablet 3   atorvastatin  (LIPITOR) 40 MG tablet TAKE 1 TABLET DAILY FOR    CHOLESTEROL 90 tablet 2   Coenzyme Q10 (COQ10 PO) Take 1 capsule by mouth daily.      GLUCOSAMINE SULFATE PO Take by mouth.     Iron -Vitamin C  65-125 MG TABS Take 1 tablet by mouth daily.     losartan -hydrochlorothiazide  (HYZAAR) 100-25 MG tablet TAKE 1 TABLET DAILY FOR    BLOOD PRESSURE 90 tablet 2   Multiple Vitamin (MULTIVITAMIN) tablet Take 1 tablet by mouth daily.     Omega-3 Fatty Acids (FISH OIL) 1000 MG CAPS Take 1,000 mg by mouth 2 (two) times daily.     sertraline  (ZOLOFT ) 100 MG tablet Take 1 tablet (100 mg total) by mouth daily. for anxiety and depression. 90 tablet 3   No current facility-administered medications on file prior to visit.   "

## 2025-05-21 ENCOUNTER — Ambulatory Visit: Admitting: Dermatology

## 2025-08-04 ENCOUNTER — Inpatient Hospital Stay

## 2025-08-04 ENCOUNTER — Inpatient Hospital Stay: Admitting: Oncology

## 2025-09-22 ENCOUNTER — Ambulatory Visit

## 2025-09-22 ENCOUNTER — Encounter: Admitting: Primary Care
